# Patient Record
Sex: Female | Born: 1935 | Race: White | Hispanic: No | Marital: Married | Smoking: Never smoker
Health system: Southern US, Community
[De-identification: ages and names within clinical notes are randomized; demographics above are authoritative.]

## PROBLEM LIST (undated history)

## (undated) DIAGNOSIS — I8390 Asymptomatic varicose veins of unspecified lower extremity: Secondary | ICD-10-CM

## (undated) DIAGNOSIS — S42293A Other displaced fracture of upper end of unspecified humerus, initial encounter for closed fracture: Secondary | ICD-10-CM

## (undated) DIAGNOSIS — K219 Gastro-esophageal reflux disease without esophagitis: Secondary | ICD-10-CM

## (undated) DIAGNOSIS — I10 Essential (primary) hypertension: Secondary | ICD-10-CM

## (undated) DIAGNOSIS — D126 Benign neoplasm of colon, unspecified: Secondary | ICD-10-CM

## (undated) DIAGNOSIS — E7800 Pure hypercholesterolemia, unspecified: Secondary | ICD-10-CM

## (undated) DIAGNOSIS — K7460 Unspecified cirrhosis of liver: Secondary | ICD-10-CM

## (undated) DIAGNOSIS — E119 Type 2 diabetes mellitus without complications: Secondary | ICD-10-CM

## (undated) DIAGNOSIS — S62109A Fracture of unspecified carpal bone, unspecified wrist, initial encounter for closed fracture: Secondary | ICD-10-CM

## (undated) DIAGNOSIS — I2510 Atherosclerotic heart disease of native coronary artery without angina pectoris: Secondary | ICD-10-CM

## (undated) DIAGNOSIS — M8580 Other specified disorders of bone density and structure, unspecified site: Secondary | ICD-10-CM

## (undated) DIAGNOSIS — R7611 Nonspecific reaction to tuberculin skin test without active tuberculosis: Secondary | ICD-10-CM

## (undated) HISTORY — DX: Asymptomatic varicose veins of unspecified lower extremity: I83.90

## (undated) HISTORY — DX: Benign neoplasm of colon, unspecified: D12.6

## (undated) HISTORY — DX: Other specified disorders of bone density and structure, unspecified site: M85.80

## (undated) HISTORY — DX: Atherosclerotic heart disease of native coronary artery without angina pectoris: I25.10

## (undated) HISTORY — DX: Fracture of unspecified carpal bone, unspecified wrist, initial encounter for closed fracture: S62.109A

## (undated) HISTORY — DX: Unspecified cirrhosis of liver: K74.60

## (undated) HISTORY — DX: Other displaced fracture of upper end of unspecified humerus, initial encounter for closed fracture: S42.293A

## (undated) HISTORY — DX: Nonspecific reaction to tuberculin skin test without active tuberculosis: R76.11

## (undated) HISTORY — DX: Type 2 diabetes mellitus without complications: E11.9

---

## 1997-10-01 ENCOUNTER — Ambulatory Visit (HOSPITAL_COMMUNITY): Admission: RE | Admit: 1997-10-01 | Discharge: 1997-10-01 | Payer: Self-pay | Admitting: Internal Medicine

## 1998-05-07 ENCOUNTER — Ambulatory Visit (HOSPITAL_COMMUNITY): Admission: RE | Admit: 1998-05-07 | Discharge: 1998-05-07 | Payer: Self-pay | Admitting: Internal Medicine

## 1998-06-18 ENCOUNTER — Ambulatory Visit (HOSPITAL_COMMUNITY): Admission: RE | Admit: 1998-06-18 | Discharge: 1998-06-18 | Payer: Self-pay | Admitting: Obstetrics & Gynecology

## 1999-05-13 ENCOUNTER — Ambulatory Visit (HOSPITAL_COMMUNITY): Admission: RE | Admit: 1999-05-13 | Discharge: 1999-05-13 | Payer: Self-pay | Admitting: Internal Medicine

## 1999-05-13 ENCOUNTER — Encounter: Payer: Self-pay | Admitting: Internal Medicine

## 1999-11-23 ENCOUNTER — Encounter: Admission: RE | Admit: 1999-11-23 | Discharge: 2000-01-05 | Payer: Self-pay | Admitting: Orthopedic Surgery

## 2000-05-17 ENCOUNTER — Encounter: Payer: Self-pay | Admitting: Internal Medicine

## 2000-05-17 ENCOUNTER — Ambulatory Visit (HOSPITAL_COMMUNITY): Admission: RE | Admit: 2000-05-17 | Discharge: 2000-05-17 | Payer: Self-pay | Admitting: Internal Medicine

## 2001-05-18 ENCOUNTER — Encounter: Payer: Self-pay | Admitting: Internal Medicine

## 2001-05-18 ENCOUNTER — Ambulatory Visit (HOSPITAL_COMMUNITY): Admission: RE | Admit: 2001-05-18 | Discharge: 2001-05-18 | Payer: Self-pay | Admitting: Internal Medicine

## 2002-05-22 ENCOUNTER — Ambulatory Visit (HOSPITAL_COMMUNITY): Admission: RE | Admit: 2002-05-22 | Discharge: 2002-05-22 | Payer: Self-pay | Admitting: Internal Medicine

## 2002-05-22 ENCOUNTER — Encounter: Payer: Self-pay | Admitting: Internal Medicine

## 2003-01-01 ENCOUNTER — Encounter: Payer: Self-pay | Admitting: Emergency Medicine

## 2003-01-01 ENCOUNTER — Emergency Department (HOSPITAL_COMMUNITY): Admission: EM | Admit: 2003-01-01 | Discharge: 2003-01-01 | Payer: Self-pay | Admitting: Emergency Medicine

## 2003-01-04 ENCOUNTER — Ambulatory Visit (HOSPITAL_COMMUNITY): Admission: RE | Admit: 2003-01-04 | Discharge: 2003-01-05 | Payer: Self-pay | Admitting: Orthopedic Surgery

## 2003-01-04 ENCOUNTER — Encounter: Payer: Self-pay | Admitting: Orthopedic Surgery

## 2003-02-04 ENCOUNTER — Encounter: Admission: RE | Admit: 2003-02-04 | Discharge: 2003-04-29 | Payer: Self-pay | Admitting: Orthopedic Surgery

## 2003-05-17 ENCOUNTER — Encounter: Payer: Self-pay | Admitting: Internal Medicine

## 2003-05-17 ENCOUNTER — Ambulatory Visit (HOSPITAL_COMMUNITY): Admission: RE | Admit: 2003-05-17 | Discharge: 2003-05-17 | Payer: Self-pay | Admitting: Internal Medicine

## 2004-06-19 ENCOUNTER — Ambulatory Visit (HOSPITAL_COMMUNITY): Admission: RE | Admit: 2004-06-19 | Discharge: 2004-06-19 | Payer: Self-pay | Admitting: Internal Medicine

## 2005-06-23 ENCOUNTER — Ambulatory Visit (HOSPITAL_COMMUNITY): Admission: RE | Admit: 2005-06-23 | Discharge: 2005-06-23 | Payer: Self-pay | Admitting: Internal Medicine

## 2005-08-16 DIAGNOSIS — D126 Benign neoplasm of colon, unspecified: Secondary | ICD-10-CM

## 2005-08-16 HISTORY — DX: Benign neoplasm of colon, unspecified: D12.6

## 2006-07-12 ENCOUNTER — Ambulatory Visit (HOSPITAL_COMMUNITY): Admission: RE | Admit: 2006-07-12 | Discharge: 2006-07-12 | Payer: Self-pay | Admitting: Internal Medicine

## 2007-07-20 ENCOUNTER — Ambulatory Visit (HOSPITAL_COMMUNITY): Admission: RE | Admit: 2007-07-20 | Discharge: 2007-07-20 | Payer: Self-pay | Admitting: Internal Medicine

## 2008-07-23 ENCOUNTER — Ambulatory Visit (HOSPITAL_COMMUNITY): Admission: RE | Admit: 2008-07-23 | Discharge: 2008-07-23 | Payer: Self-pay | Admitting: Internal Medicine

## 2009-10-15 ENCOUNTER — Ambulatory Visit (HOSPITAL_COMMUNITY): Admission: RE | Admit: 2009-10-15 | Discharge: 2009-10-15 | Payer: Self-pay | Admitting: Internal Medicine

## 2010-06-15 ENCOUNTER — Inpatient Hospital Stay (HOSPITAL_BASED_OUTPATIENT_CLINIC_OR_DEPARTMENT_OTHER): Admission: RE | Admit: 2010-06-15 | Discharge: 2010-06-15 | Payer: Self-pay | Admitting: Interventional Cardiology

## 2010-09-06 ENCOUNTER — Encounter: Payer: Self-pay | Admitting: Internal Medicine

## 2010-11-30 ENCOUNTER — Other Ambulatory Visit (HOSPITAL_COMMUNITY): Payer: Self-pay | Admitting: Internal Medicine

## 2010-11-30 DIAGNOSIS — Z1231 Encounter for screening mammogram for malignant neoplasm of breast: Secondary | ICD-10-CM

## 2010-12-02 ENCOUNTER — Ambulatory Visit (HOSPITAL_COMMUNITY)
Admission: RE | Admit: 2010-12-02 | Discharge: 2010-12-02 | Disposition: A | Discharge status: Home / Self Care | Payer: Medicare Other | Source: Ambulatory Visit | Attending: Internal Medicine | Admitting: Internal Medicine

## 2010-12-02 DIAGNOSIS — Z1231 Encounter for screening mammogram for malignant neoplasm of breast: Secondary | ICD-10-CM | POA: Insufficient documentation

## 2011-01-01 NOTE — Op Note (Signed)
NAME:  Sheila Hanson, Sheila Hanson                         ACCOUNT NO.:  [id number]   MEDICAL RECORD NO.:  [id number]                   PATIENT TYPE:  REC   LOCATION:  [facility]                                 FACILITY:  [facility]   PHYSICIAN:  Burnard Bunting, M.D.                 DATE OF BIRTH:  November 26, 1933   DATE OF PROCEDURE:  02/04/2003  DATE OF DISCHARGE:                                 OPERATIVE REPORT   PREOPERATIVE DIAGNOSIS:  Right ankle trimalleolar ankle fracture.   POSTOPERATIVE DIAGNOSIS:  Right ankle trimalleolar ankle fracture.   OPERATION PERFORMED:  Right ankle open reduction internal fixation of  lateral and medial malleolus.   SURGEON:  Burnard Bunting, M.D.   ANESTHESIA:  General endotracheal.   ESTIMATED BLOOD LOSS:  10mL.   DRAINS:  None.   Ankle Esmarch utilized for 79 minutes.   DESCRIPTION OF PROCEDURE:  The patient was brought to the operating room  where general endotracheal anesthesia was induced.  Preoperative antibiotics  were administered.  Right ankle and leg was prepped with DuraPrep solution,  and draped in sterile manner.  Ankle Esmarch was utilized.  This was  utilized to the mid-calf level for approximately 79 minutes.  A lateral  incision was made beginning at the tip of the fibula extending proximally.  The superficial peroneal nerve was identified and mobilized inferiorly.  The  fracture site was visualized and reduced.  The patient's bone quality was  soft.  Because of the fracture configuration, lag screw placement was not  possible.  A 7-hole one third tubular locking plate was then fashioned and  applied to the fibular surface.  Good fixation was achieved.  Following near  anatomic fixation of the lateral malleolar fracture, the medial malleolus  was noted to be well reduced under fluoroscopy.  The fluoroscope was used in  the AP and lateral planes to assist with reduction and screw placement.  The  towel clip was used to pull the fibula laterally and  syndesmosis was stable.  At this time a small medial incision was made and the medial malleolar  fracture was reduced under direct visualization.  Two 4-0 cancellous screws  were then placed across the anterior and posterior colliculi of the medial  malleolus.  Correct placement of the screws was checked in the AP and  lateral plane.  Reduction of the posterior fragment as well as the medial  fragment was anatomic.  At this time both incisions were irrigated.  Tourniquet was released.  Bleeding points were controlled using bipolar  electrocautery.  The skin on the lateral side was closed by achieving soft  tissue closure first over the bone and plate by reapproximating the  periosteal sleeve.  The skin was closed using interrupted inverted 3-0  Vicryl followed by nylon sutures.  The medial incision was closed using  interrupted  inverted 3-0 Vicryl and nylon sutures.  The patient  was placed in a bulky  posterior splint with the heel well padded.  Again fluoroscopic views did  demonstrate good reduction of the ankle mortise, good stability of the  syndesmosis.  The patient was transferred to the recovery room in stable  condition.                                                Burnard Bunting, M.D.    GSD/MEDQ  D:  02/04/2003  T:  02/05/2003  Job:  [id number]

## 2011-01-01 NOTE — Consult Note (Signed)
NAME:  Sheila Hanson, Sheila Hanson                         ACCOUNT NO.:  [id number]   MEDICAL RECORD NO.:  [id number]                   PATIENT TYPE:  EMS   LOCATION:  [facility]                                 FACILITY:  [facility]   PHYSICIAN:  Burnard Bunting, M.D.                 DATE OF BIRTH:  1934-04-10   DATE OF CONSULTATION:  01/01/2003  DATE OF DISCHARGE:                                   CONSULTATION   REFERRING PHYSICIAN:  Emergency room.   CHIEF COMPLAINT:  Right and left ankle pain.   HISTORY OF PRESENT ILLNESS:  The patient is an active 77 year old female who  was picking strawberries in the rain today when she fell and injured her  right and left ankle.  She denies any orthopedic complaints.  She denies any  back pain.   CURRENT MEDICATIONS:  Premarin.   ALLERGIES:  PENICILLIN.  BETADINE.  IVP DYE.  VANCOMYCIN.   PAST SURGICAL HISTORY:  Left wrist fracture surgery performed in the past  with good result.   PHYSICAL EXAMINATION:  MUSCULOSKELETAL:  Nonpainful range of motion in  either hip.  Knee range of motion was full with no effusion.  Upper  extremities with good range of motion with no ecchymosis, effusion or  crepitus in the elbows, wrists or shoulders.  Right ankle demonstrates  tenderness to palpation on the medial and lateral side.  Left ankle also has  tenderness to palpation laterally.  There is some ecchymosis around both  ankles on the lateral aspect and on the medial aspect of the right ankle.  There is no proximal fibular tenderness on either side.  There is palpable  pedal pulse bilaterally.  She has some mild paresthesias on the dorsal  aspect of the right foot, negative on the left foot.  There are no  paresthesias on the bottom.  Toe dorsiflexion and plantar flexion is intact.   LABORATORY DATA AND X-RAY FINDINGS:  X-rays of the right foot show minimally  displaced trimalleolar ankle fracture.  X-rays of the left foot are normal.   IMPRESSION:  1. Right  trimalleolar ankle fracture with minimal displacement.  2. Left ankle sprain.   RECOMMENDATIONS:  I think the patient has an unstable ankle fracture.  Her  posterior malleolar fragment is displaced about 2-3 mm and is about 20% of  the articular surface.  The patient needs to have open reduction internal  fixation.  This will involve plates and screws.  She actually took about  three aspirin last night.  She also ate at 6 p.m. tonight.  The patient was  placed into a well-padded posterior splint on the right hand side and an ASO  on the left hand side.  We are going to get her a walker as well.  She needs  to keep the leg elevated, nonweightbearing on the right hand side.  She  needs to have surgery for  the ankle within the next several days.  She is  instructed to keep her leg elevated.  We also wrote a prescription for  Percocet for pain.                                              Burnard Bunting, M.D.   GSD/MEDQ  D:  01/01/2003  T:  01/02/2003  Job:  [id number]

## 2011-05-15 ENCOUNTER — Emergency Department (INDEPENDENT_AMBULATORY_CARE_PROVIDER_SITE_OTHER): Payer: Medicare Other

## 2011-05-15 ENCOUNTER — Encounter: Payer: Self-pay | Admitting: *Deleted

## 2011-05-15 ENCOUNTER — Inpatient Hospital Stay (HOSPITAL_COMMUNITY)
Admission: EM | Admit: 2011-05-15 | Discharge: 2011-05-22 | DRG: 087 | Disposition: A | Discharge status: Home / Self Care | Payer: Medicare Other | Source: Other Acute Inpatient Hospital | Attending: Neurosurgery | Admitting: Neurosurgery

## 2011-05-15 ENCOUNTER — Emergency Department (HOSPITAL_BASED_OUTPATIENT_CLINIC_OR_DEPARTMENT_OTHER)
Admission: EM | Admit: 2011-05-15 | Discharge: 2011-05-15 | Disposition: A | Discharge status: Other Acute Inpatient Hospital | Payer: Medicare Other | Source: Home / Self Care | Attending: Emergency Medicine | Admitting: Emergency Medicine

## 2011-05-15 DIAGNOSIS — S0633AA Contusion and laceration of cerebrum, unspecified, with loss of consciousness status unknown, initial encounter: Secondary | ICD-10-CM | POA: Insufficient documentation

## 2011-05-15 DIAGNOSIS — S062X9A Diffuse traumatic brain injury with loss of consciousness of unspecified duration, initial encounter: Secondary | ICD-10-CM

## 2011-05-15 DIAGNOSIS — S06330A Contusion and laceration of cerebrum, unspecified, without loss of consciousness, initial encounter: Secondary | ICD-10-CM | POA: Insufficient documentation

## 2011-05-15 DIAGNOSIS — W010XXA Fall on same level from slipping, tripping and stumbling without subsequent striking against object, initial encounter: Secondary | ICD-10-CM | POA: Insufficient documentation

## 2011-05-15 DIAGNOSIS — S02109A Fracture of base of skull, unspecified side, initial encounter for closed fracture: Principal | ICD-10-CM | POA: Diagnosis present

## 2011-05-15 DIAGNOSIS — W19XXXA Unspecified fall, initial encounter: Secondary | ICD-10-CM

## 2011-05-15 DIAGNOSIS — Y998 Other external cause status: Secondary | ICD-10-CM

## 2011-05-15 DIAGNOSIS — S0280XA Fracture of other specified skull and facial bones, unspecified side, initial encounter for closed fracture: Secondary | ICD-10-CM | POA: Insufficient documentation

## 2011-05-15 DIAGNOSIS — Z79899 Other long term (current) drug therapy: Secondary | ICD-10-CM | POA: Insufficient documentation

## 2011-05-15 DIAGNOSIS — K219 Gastro-esophageal reflux disease without esophagitis: Secondary | ICD-10-CM | POA: Insufficient documentation

## 2011-05-15 DIAGNOSIS — R197 Diarrhea, unspecified: Secondary | ICD-10-CM | POA: Diagnosis not present

## 2011-05-15 DIAGNOSIS — I609 Nontraumatic subarachnoid hemorrhage, unspecified: Secondary | ICD-10-CM | POA: Insufficient documentation

## 2011-05-15 DIAGNOSIS — R296 Repeated falls: Secondary | ICD-10-CM | POA: Diagnosis present

## 2011-05-15 DIAGNOSIS — Y9289 Other specified places as the place of occurrence of the external cause: Secondary | ICD-10-CM | POA: Insufficient documentation

## 2011-05-15 DIAGNOSIS — E7800 Pure hypercholesterolemia, unspecified: Secondary | ICD-10-CM | POA: Insufficient documentation

## 2011-05-15 DIAGNOSIS — R21 Rash and other nonspecific skin eruption: Secondary | ICD-10-CM | POA: Diagnosis not present

## 2011-05-15 DIAGNOSIS — S065X9A Traumatic subdural hemorrhage with loss of consciousness of unspecified duration, initial encounter: Secondary | ICD-10-CM

## 2011-05-15 DIAGNOSIS — I6200 Nontraumatic subdural hemorrhage, unspecified: Secondary | ICD-10-CM | POA: Insufficient documentation

## 2011-05-15 DIAGNOSIS — I10 Essential (primary) hypertension: Secondary | ICD-10-CM | POA: Insufficient documentation

## 2011-05-15 DIAGNOSIS — S0291XA Unspecified fracture of skull, initial encounter for closed fracture: Secondary | ICD-10-CM

## 2011-05-15 DIAGNOSIS — Z7982 Long term (current) use of aspirin: Secondary | ICD-10-CM

## 2011-05-15 HISTORY — DX: Gastro-esophageal reflux disease without esophagitis: K21.9

## 2011-05-15 HISTORY — DX: Pure hypercholesterolemia, unspecified: E78.00

## 2011-05-15 HISTORY — DX: Essential (primary) hypertension: I10

## 2011-05-15 LAB — BASIC METABOLIC PANEL
BUN: 15 mg/dL (ref 6–23)
Creatinine, Ser: 0.6 mg/dL (ref 0.50–1.10)
GFR calc Af Amer: >60 mL/min (ref >60)
GFR calc non Af Amer: >60 mL/min (ref >60)
Glucose, Bld: 141 mg/dL — ABNORMAL HIGH (ref 70–99)
Potassium: 3.6 mEq/L (ref 3.5–5.1)

## 2011-05-15 LAB — CBC
HCT: 38.9 % (ref 36.0–46.0)
Hemoglobin: 13.3 g/dL (ref 12.0–15.0)
MCH: 29.4 pg (ref 26.0–34.0)
MCHC: 34.2 g/dL (ref 30.0–36.0)
MCV: 86.1 fL (ref 78.0–100.0)
RBC: 4.52 MIL/uL (ref 3.87–5.11)

## 2011-05-15 LAB — DIFFERENTIAL
Basophils Relative: 0 % (ref 0–1)
Eosinophils Absolute: 0.1 10*3/uL (ref 0.0–0.7)
Eosinophils Relative: 0 % (ref 0–5)
Lymphs Abs: 1.6 10*3/uL (ref 0.7–4.0)
Monocytes Absolute: 0.6 10*3/uL (ref 0.1–1.0)
Monocytes Relative: 5 % (ref 3–12)

## 2011-05-15 LAB — APTT: aPTT: 36 seconds (ref 24–37)

## 2011-05-15 IMAGING — CT CT HEAD W/O CM
1 series · 15 of 30 positions shown, 19 images · non-contrast
Comparison: None.

CLINICAL DATA: 77-year-old female status post fall.  Headache and
nausea.

CT HEAD WITHOUT CONTRAST
TECHNIQUE: Contiguous axial images were obtained from the base of
the skull through the vertex without contrast.

[Series 2: head 4.8 h37s · axial · 0.44mm/px · z∈[-103,+34]mm · 15 of 32 slices shown, 19 images]
[im 2/32  brain]
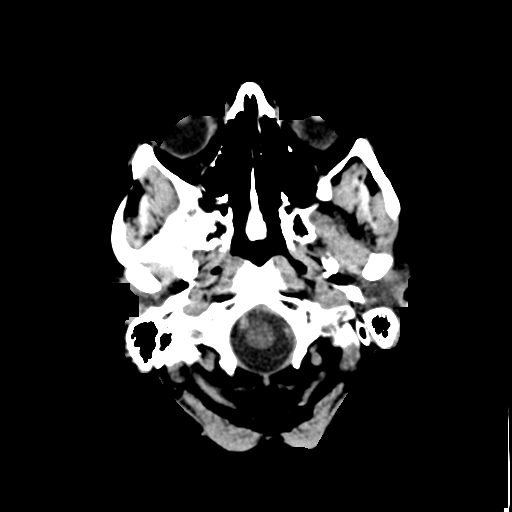
[im 2/32  bone]
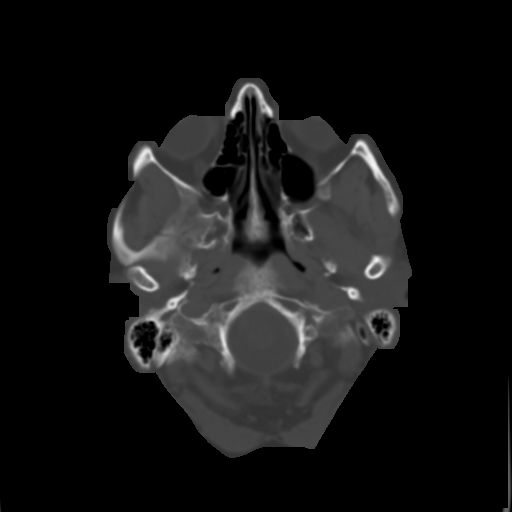
[im 4/32  brain]
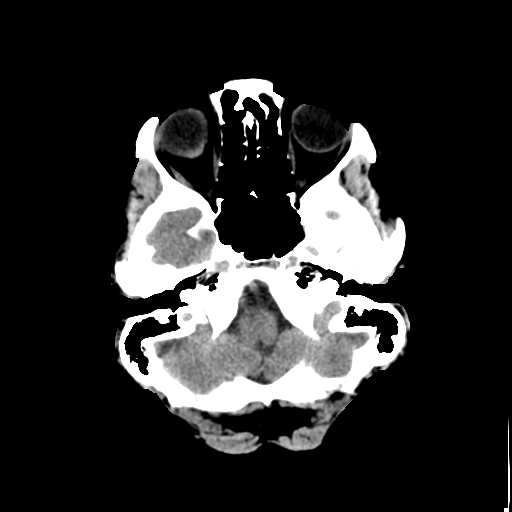
[im 6/32  brain]
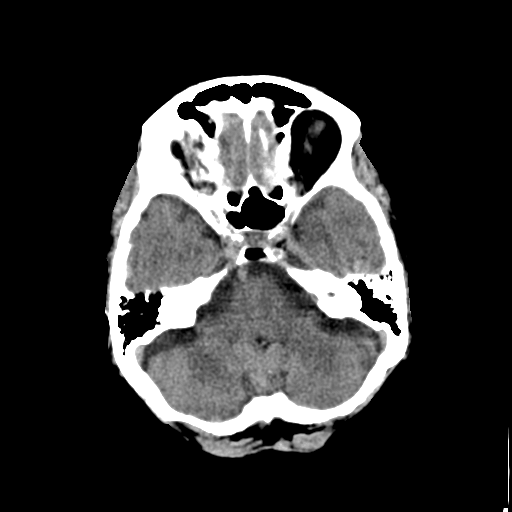
[im 8/32  brain]
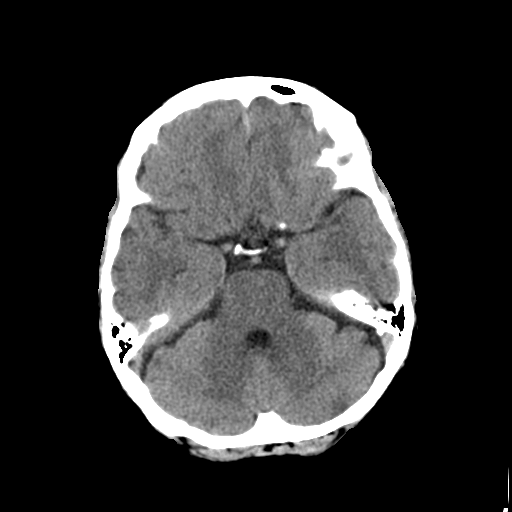
[im 10/32  brain]
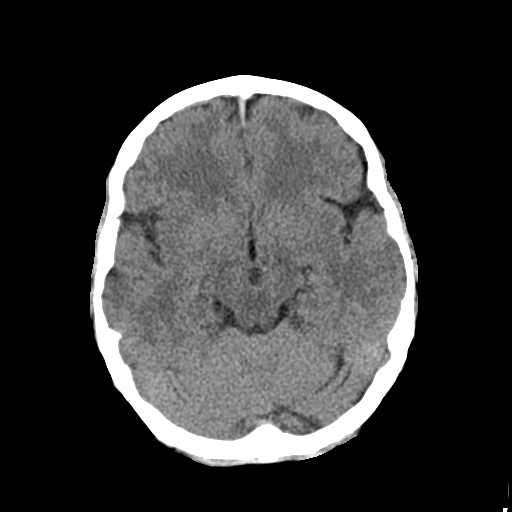
[im 10/32  bone]
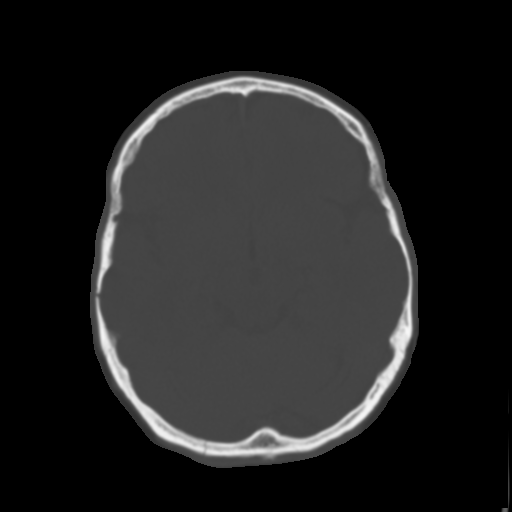
[im 12/32  brain]
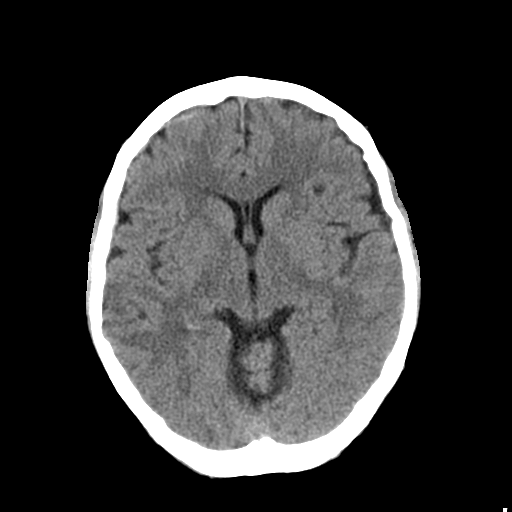
[im 14/32  brain]
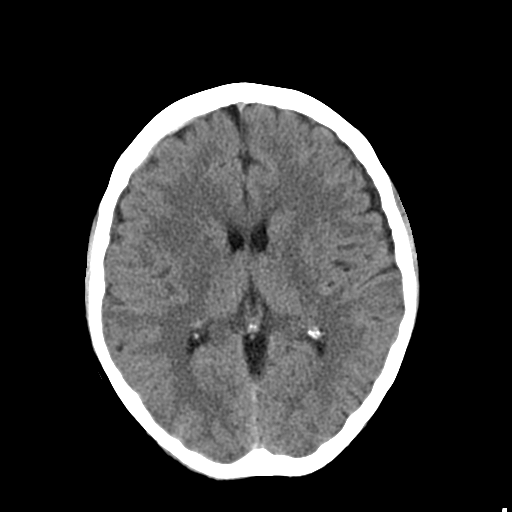
[im 17/32  brain]
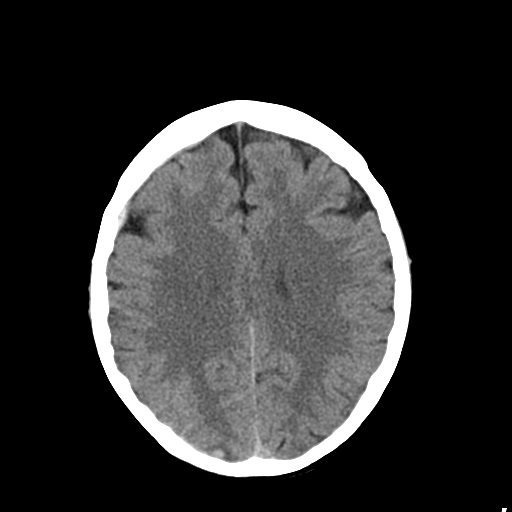
[im 18/32  brain]
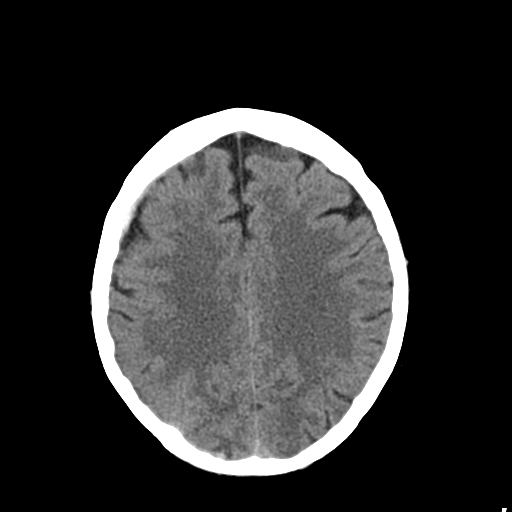
[im 18/32  bone]
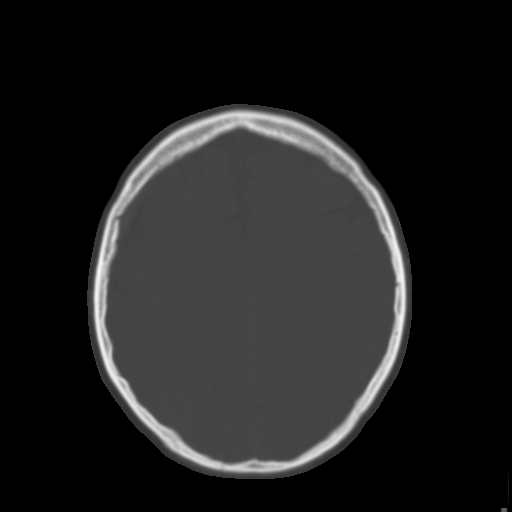
[im 20/32  brain]
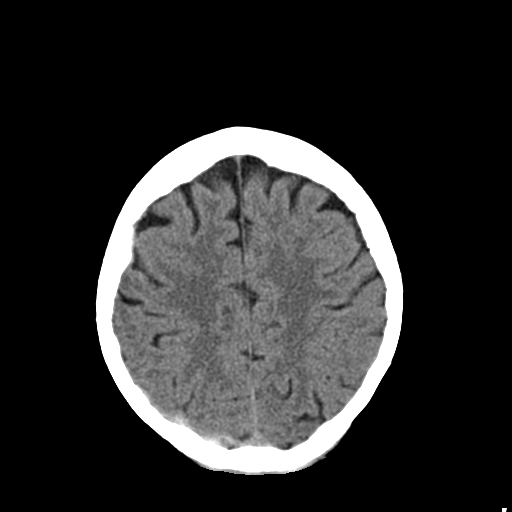
[im 22/32  brain]
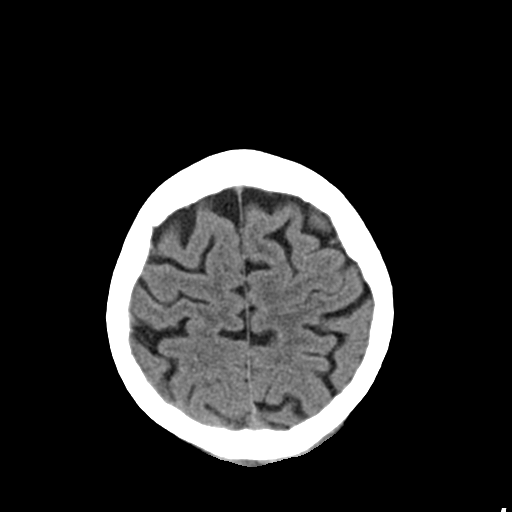
[im 24/32  brain]
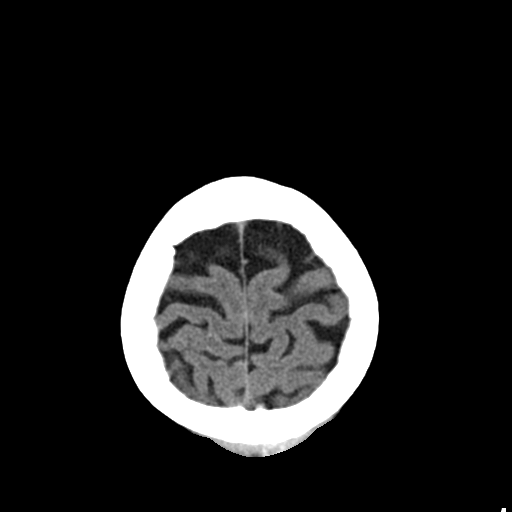
[im 26/32  brain]
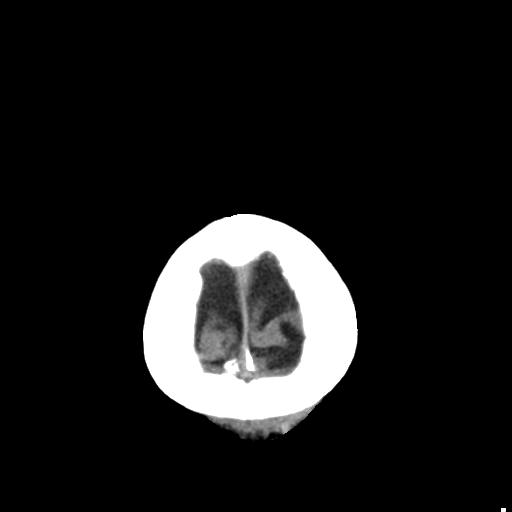
[im 26/32  bone]
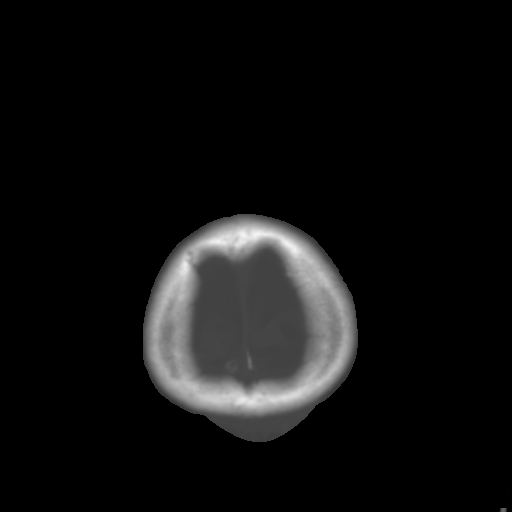
[im 28/32  brain]
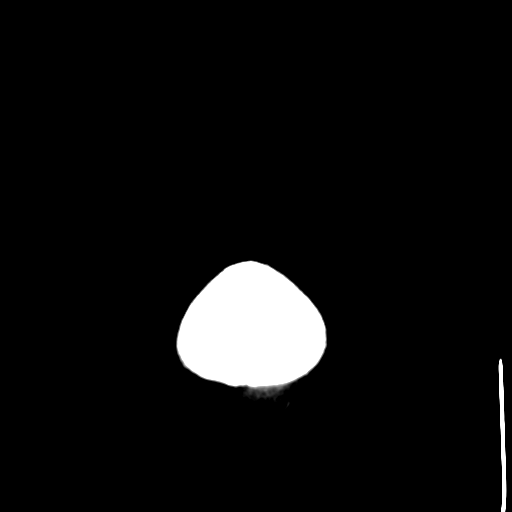
[im 30/32  brain]
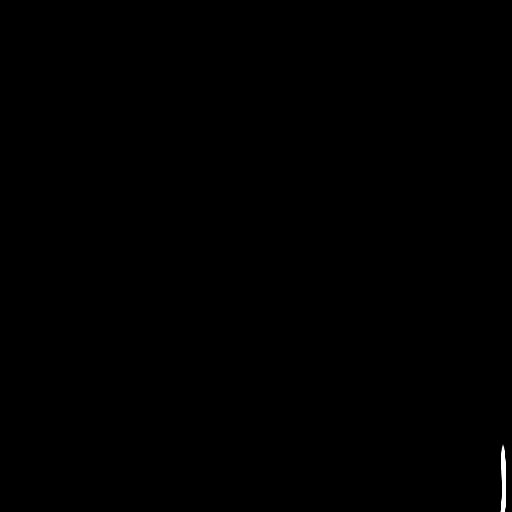

[15 of 30 positions shown; findings below may reference images not displayed]

FINDINGS: Broad-based posterior scalp hematoma measures up to 14 mm
in short axis.  Nondisplaced fracture of the right occipital bone
extends cephalad.  Pain just below the occipital protuberance
(series 3 image 14).

Calvarium elsewhere appears intact. Visualized paranasal sinuses
and mastoids are clear.  Visualized orbit soft tissues are within
normal limits.

Small right subdural hematoma extends along the anterior convexity
measuring up to 4-5 mm in thickness.  No subsequent midline shift
or significant mass effect on the right hemisphere.  No
ventriculomegaly or intraventricular hemorrhage.

There does appear to be a small hemorrhagic contusion of the right
occipital pole underlying the calvarial fracture on series 2 image
17.  There may be trace associated subarachnoid hemorrhage in the
adjacent sulci.  No associated mass effect.

Basilar cisterns are patent. No evidence of cortically based acute
infarction identified.  No suspicious intracranial vascular
hyperdensity.
IMPRESSION: 1.  Small right subdural hematoma, 4-5 mm in thickness.  No
associated mass effect.
2.  Small right occipital lobe hemorrhagic contusion.  Trace
adjacent subarachnoid hemorrhage suspected.
3.  Associated nondisplaced rod are at the bone fracture.
4.  Overlying posterior scalp hematoma.

Critical Value/emergent results were called by telephone at the
time of interpretation on [DATE]  at [XQ] hours  to  Dr.
SIDDHARTH, who verbally acknowledged these results.

## 2011-05-15 MED ORDER — FENTANYL CITRATE 0.05 MG/ML IJ SOLN
50.0000 ug | Freq: Once | INTRAMUSCULAR | Status: AC
  Administered 2011-05-15: 50 ug via INTRAVENOUS
  Filled 2011-05-15: qty 2

## 2011-05-15 MED ORDER — NICARDIPINE HCL IN NACL 20-0.86 MG/200ML-% IV SOLN
5.0000 mg/h | Freq: Once | INTRAVENOUS | Status: AC
  Administered 2011-05-15: 5 mg/h via INTRAVENOUS
  Filled 2011-05-15: qty 200

## 2011-05-15 MED ORDER — ONDANSETRON HCL 4 MG/2ML IJ SOLN
4.0000 mg | Freq: Once | INTRAMUSCULAR | Status: AC
  Administered 2011-05-15: 4 mg via INTRAVENOUS
  Filled 2011-05-15: qty 2

## 2011-05-15 NOTE — ED Provider Notes (Signed)
History   Chart scribed for Nat Christen, MD by Enos Fling; the patient was seen in room MH11/MH11; this patient's care was started at 9:35 PM.   CSN: [id number] Arrival date & time: 05/15/2011  8:36 PM  Chief Complaint  Patient presents with  . Fall    HPI Sheila Hanson is a 77 y.o. female who presents to the Emergency Department s/p fall. Per husband, pt tripped and fell backward on cement, hitting the back of her head approx 5.5 hours ago. Pt c/o headache, nausea, and vomiting persistent since the fall, as well as "muscular" hip and neck soreness. Pt has been ambulatory without difficulty. She reports brief blurred vision and hearing change that has resolved. No sob, cp, back pain, or abd pain. Pt denies numbness, tingling, weakness, dizziness, change in speech, or confusion. Pt takes daily baby ASA; no other blood thinners.   Past Medical History  Diagnosis Date  . Hypertension   . Hypercholesteremia   . GERD (gastroesophageal reflux disease)     History reviewed. No pertinent past surgical history.  History reviewed. No pertinent family history.  History  Substance Use Topics  . Smoking status: Never Smoker   . Smokeless tobacco: Not on file  . Alcohol Use: No    OB History    Grav Para Term Preterm Abortions TAB SAB Ect Mult Living                  Review of Systems  Constitutional: Negative for fever and chills.  HENT: Negative for congestion and rhinorrhea.   Eyes: Negative for redness.  Respiratory: Negative for cough and shortness of breath.   Cardiovascular: Negative for chest pain and leg swelling.  Gastrointestinal: Positive for nausea and vomiting. Negative for abdominal pain and diarrhea.  Genitourinary: Negative for flank pain.  Musculoskeletal: Negative for back pain.       Hip soreness  Skin: Negative for rash.  Neurological: Positive for headaches. Negative for dizziness, syncope, speech difficulty, weakness and numbness.    Psychiatric/Behavioral: Negative for confusion.    Allergies  Penicillins; Azithromycin; Benadryl allergy; Codeine sulfate; Iodine; Oxycodone; Percocet; and Vancomycin  Home Medications   Current Outpatient Rx  Name Route Sig Dispense Refill  . ASPIRIN 81 MG PO TABS Oral Take 81 mg by mouth daily.      Marland Kitchen BIOTIN 1000 MCG PO TABS Oral Take 1,000 mcg by mouth daily.      Marland Kitchen CALCIUM-MAGNESIUM-VITAMIN D 600-40-500 MG-MG-UNIT PO TB24 Oral Take 1 tablet by mouth daily.      Marland Kitchen VITAMIN D 1000 UNITS PO TABS Oral Take 1,000 Units by mouth daily.      Marland Kitchen COLACE PO Oral Take 3 capsules by mouth at bedtime.      . ESTROGENS CONJUGATED 0.625 MG PO TABS Oral Take 0.625 mg by mouth daily.     . OMEGA-3 FATTY ACIDS 1000 MG PO CAPS Oral Take 1 g by mouth daily.      Marland Kitchen HYDROCHLOROTHIAZIDE 25 MG PO TABS Oral Take 25 mg by mouth daily.      Marland Kitchen LORATADINE 10 MG PO TABS Oral Take 10 mg by mouth daily.      Marland Kitchen OMEPRAZOLE 20 MG PO CPDR Oral Take 20 mg by mouth daily.      Marland Kitchen SIMVASTATIN 20 MG PO TABS Oral Take 20 mg by mouth daily.     Marland Kitchen VITAMIN C 500 MG PO TABS Oral Take 500 mg by mouth daily.      Marland Kitchen  MAGNESIUM OXIDE PO Oral Take 1 tablet by mouth at bedtime.        BP 178/74  Pulse 91  Temp(Src) 98.7 F (37.1 C) (Oral)  Resp 18  SpO2 100%  Physical Exam  Constitutional: She is oriented to person, place, and time. She appears well-developed and well-nourished.  Non-toxic appearance. She does not have a sickly appearance.  HENT:  Head: Normocephalic.       Raised hematoma to left posterior scalp with mild abrasion, no laceration  Eyes: Conjunctivae, EOM and lids are normal. Pupils are equal, round, and reactive to light. No scleral icterus.  Neck: Trachea normal and normal range of motion. Neck supple.  Cardiovascular: Regular rhythm and normal heart sounds.   Pulmonary/Chest: Effort normal and breath sounds normal.  Abdominal: Soft. Normal appearance. There is no tenderness. There is no rebound, no  guarding and no CVA tenderness.  Musculoskeletal: Normal range of motion. She exhibits no tenderness.       Entire spine nontender; pelvis stable, hips nontender bilaterally  Neurological: She is alert and oriented to person, place, and time. She has normal strength. She is not disoriented. No cranial nerve deficit.  Skin: Skin is warm, dry and intact. No rash noted.  Psychiatric: She has a normal mood and affect. Her behavior is normal. Judgment and thought content normal.    ED Course  CRITICAL CARE Performed by: Emeline General A Authorized by: Emeline General A Total critical care time: 35 minutes Critical care time was exclusive of separately billable procedures and treating other patients and teaching time. Critical care was necessary to treat or prevent imminent or life-threatening deterioration of the following conditions: CNS failure or compromise and trauma. Critical care was time spent personally by me on the following activities: development of treatment plan with patient or surrogate, discussions with consultants, obtaining history from patient or surrogate, ordering and review of radiographic studies, examination of patient, ordering and review of laboratory studies, re-evaluation of patient's condition, ordering and performing treatments and interventions and pulse oximetry.   - none   Labs Reviewed  CBC  DIFFERENTIAL  BASIC METABOLIC PANEL  PROTIME-INR  APTT   Ct Head Wo Contrast  05/15/2011  *RADIOLOGY REPORT*  Clinical Data: 77 year old female status post fall.  Headache and nausea.  CT HEAD WITHOUT CONTRAST  Technique:  Contiguous axial images were obtained from the base of the skull through the vertex without contrast.  Comparison: None.  Findings: Broad-based posterior scalp hematoma measures up to 14 mm in short axis.  Nondisplaced fracture of the right occipital bone extends cephalad.  Pain just below the occipital protuberance (series 3 image 14).  Calvarium  elsewhere appears intact. Visualized paranasal sinuses and mastoids are clear.  Visualized orbit soft tissues are within normal limits.  Small right subdural hematoma extends along the anterior convexity measuring up to 4-5 mm in thickness.  No subsequent midline shift or significant mass effect on the right hemisphere.  No ventriculomegaly or intraventricular hemorrhage.  There does appear to be a small hemorrhagic contusion of the right occipital pole underlying the calvarial fracture on series 2 image 17.  There may be trace associated subarachnoid hemorrhage in the adjacent sulci.  No associated mass effect.  Basilar cisterns are patent. No evidence of cortically based acute infarction identified.  No suspicious intracranial vascular hyperdensity.  IMPRESSION: 1.  Small right subdural hematoma, 4-5 mm in thickness.  No associated mass effect. 2.  Small right occipital lobe hemorrhagic contusion.  Trace adjacent  subarachnoid hemorrhage suspected. 3.  Associated nondisplaced rod are at the bone fracture. 4.  Overlying posterior scalp hematoma.  Critical Value/emergent results were called by telephone at the time of interpretation on 05/15/2011  at 2150 hours  to  Dr. Emeline General, who verbally acknowledged these results.  Original Report Authenticated By: Harley Hallmark, M.D.    All results reviewed and discussed, questions answered, pt agreeable with plan.  OTHER DATA REVIEWED: Nursing notes and vital signs reviewed. Prior records reviewed.  MEDS GIVEN IN ED: fentaNYL (SUBLIMAZE) 0.05 MG/ML injection 50 mcg   ondansetron (ZOFRAN) injection 4 mg    9:50 PM - Informed of CT head results by radiologist 9:57 PM - Results and plan for transfer to [facility] discussed with family and pt; they understand and agree with plan  MDM  Patient had a fall earlier today and has  sustained subdural hemorrhage as well as a skull fracture and other intracranial injury from this injury.  Patient is maintaining her  mental status with the GCS 15 at this point in time. I did contact Dr. Jeral Fruit over a at [facility] and he has accepted the patient in transfer to the neurointensive care unit for further evaluation and monitoring. Patient is receiving pain medications and nausea medications to assist with her symptoms. She does not appear to have any other injury on exam as she has no spine tenderness in her CT or L-spine. She has no chest wall tenderness. She has a soft nontender abdomen as well. She is able to ambulate after this and so while her hips are sore it is unlikely she has a hip fracture. Patient is on only a baby aspirin for anticoagulation at this point in time so needs no reversal. Patient is mildly hypertensive so I will begin her on a nicardipine drip at this point in time.  Critical care time in this patient is 35 minutes. This was necessary given the significance of the patient's injuries with intracranial hemorrhage. She also is necessitating blood pressure control pain control as well and consultation with neurosurgery specialist.   No diagnosis found.  SCRIBE ATTESTATION: I personally performed the services described in this documentation, which was scribed in my presence. The recorded information has been reviewed and considered.       Nat Christen, MD 05/15/11 2231

## 2011-05-15 NOTE — ED Notes (Signed)
Pt state that she tripped and  fell this Pm around 1630 pt hit the back of her head presents with HA and nausea pt

## 2011-05-15 NOTE — ED Notes (Signed)
Report to [place] at [facility] [id number]

## 2011-05-16 ENCOUNTER — Inpatient Hospital Stay (HOSPITAL_COMMUNITY): Payer: Medicare Other

## 2011-05-16 LAB — GLUCOSE, CAPILLARY: Glucose-Capillary: 138 mg/dL — ABNORMAL HIGH (ref 70–99)

## 2011-05-16 IMAGING — CT CT HEAD W/O CM
1 of 2 series · 13 of 30 positions shown, 17 images · non-contrast
Comparison: [DATE].

CLINICAL DATA: 77-year-old female status post fall with skull
fracture, hemorrhage.

CT HEAD WITHOUT CONTRAST
TECHNIQUE: Contiguous axial images were obtained from the base of
the skull through the vertex without contrast.

[Series 2: brain · axial · 0.45mm/px · z∈[+99,+226]mm · 13 of 28 slices shown, 17 images]
[im 2/28  brain]
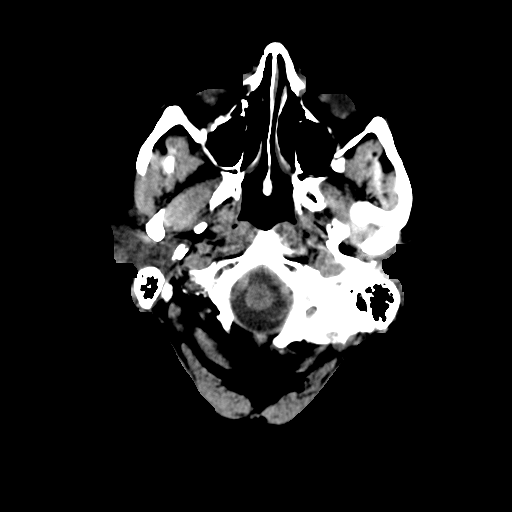
[im 2/28  bone]
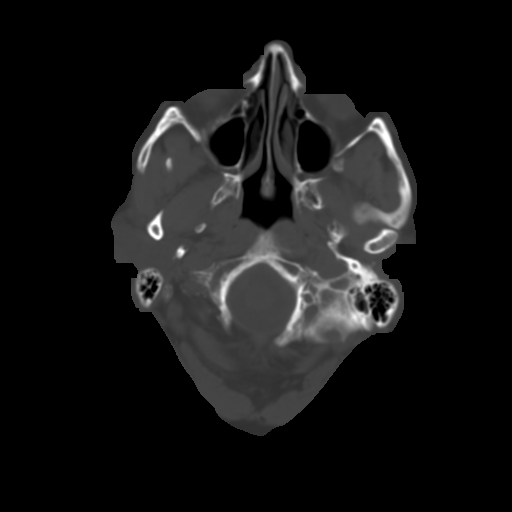
[im 4/28  brain]
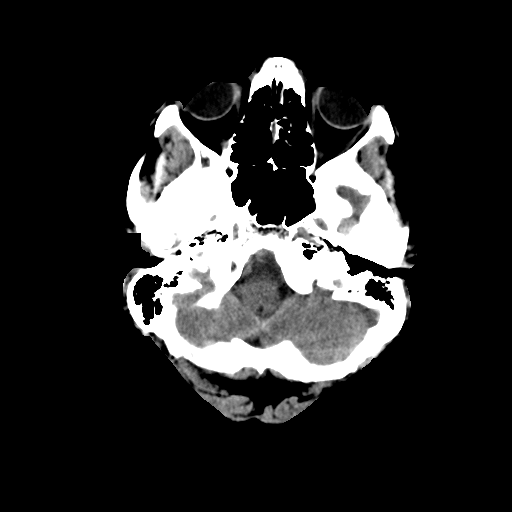
[im 6/28  brain]
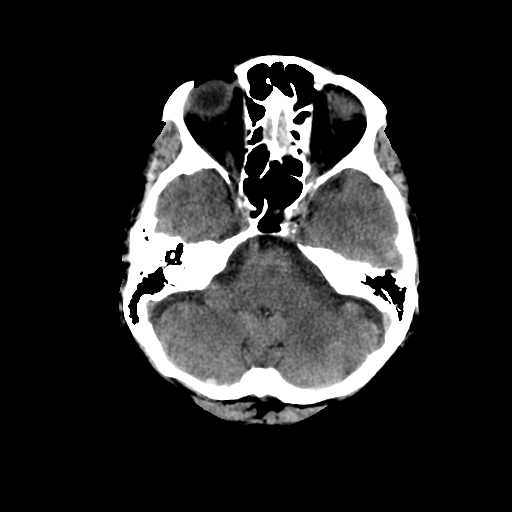
[im 8/28  brain]
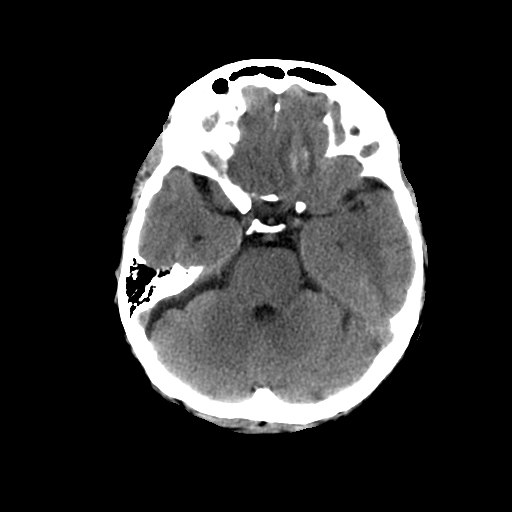
[im 10/28  brain]
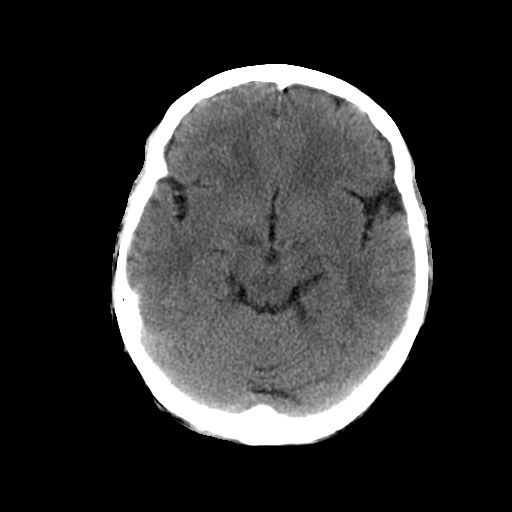
[im 10/28  bone]
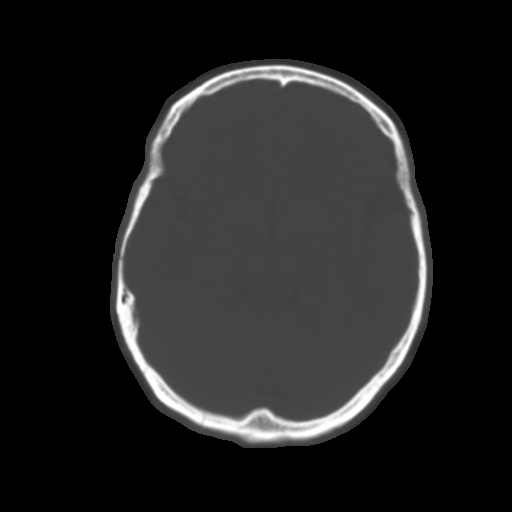
[im 12/28  brain]
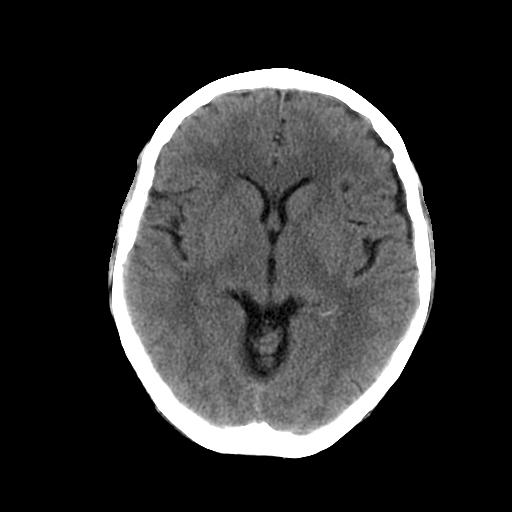
[im 14/28  brain]
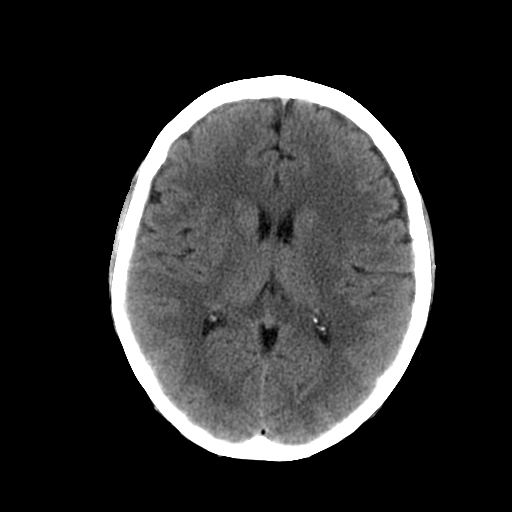
[im 16/28  brain]
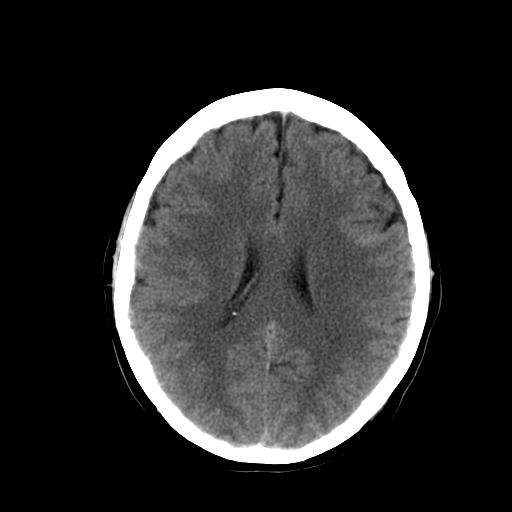
[im 18/28  brain]
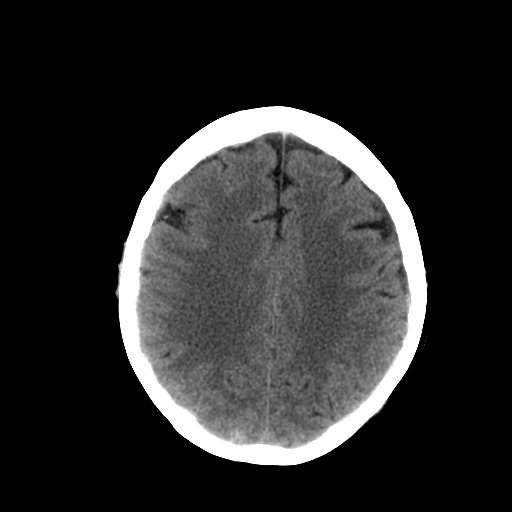
[im 18/28  bone]
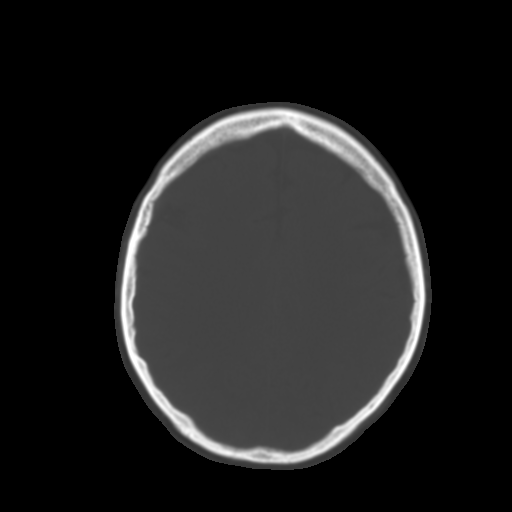
[im 20/28  brain]
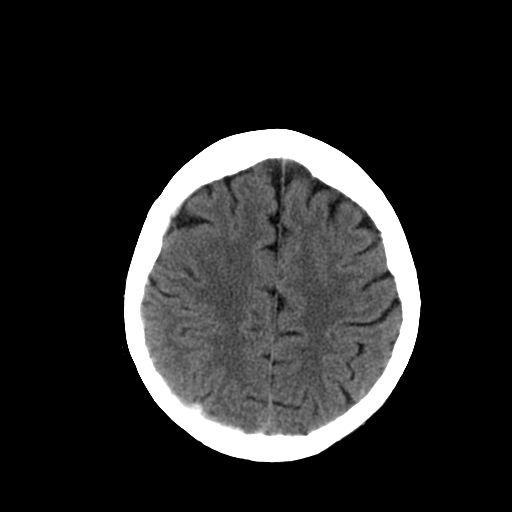
[im 22/28  brain]
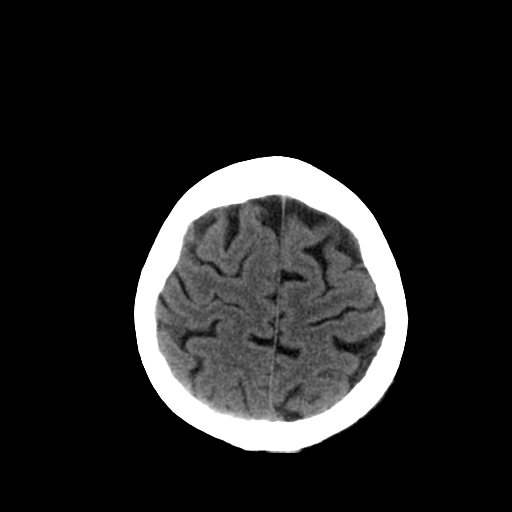
[im 24/28  brain]
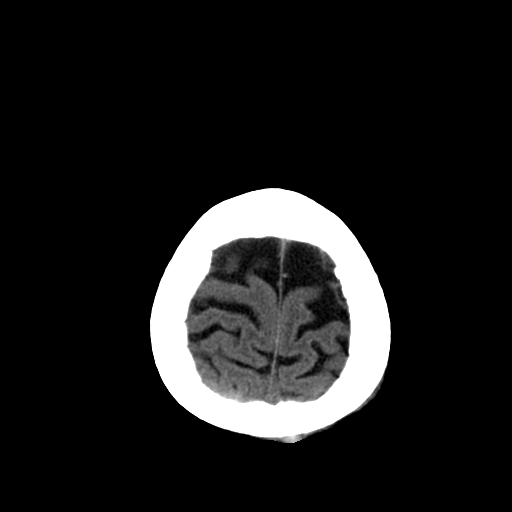
[im 26/28  brain]
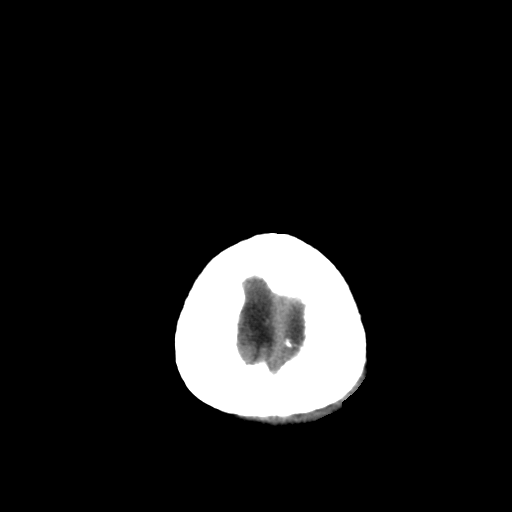
[im 26/28  bone]
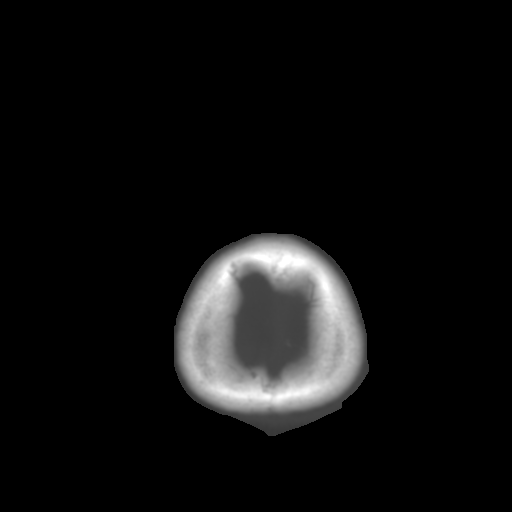

[13 of 30 positions shown; findings below may reference images not displayed]

FINDINGS: Left inferior frontal hemorrhagic contusion now more
evident (series 2 image 7).  Small right subdural hematoma has
regressed along the right lateral convexity, now up to 3 mm in
thickness however, posterior convexity extra-axial blood is mildly
increased in the vicinity of the small right occipital pole
contusion and occipital bone fracture, now up to 6 mm in thickness.
No subarachnoid or intraventricular hemorrhage.

No ventriculomegaly.  No midline shift.  No significant mass
effect.  Normal basilar cisterns.  Stable, normal gray-white matter
differentiation outside of the left frontal and right occipital
lobes. No evidence of cortically based acute infarction identified.
No suspicious intracranial vascular hyperdensity.

Unchanged nondisplaced right occipital bone fracture.  Mildly
decreased vertex scalp hematoma.  No new osseous abnormality.  Mild
left ethmoid sinus bubbly opacity.  Other Visualized paranasal
sinuses and mastoids are clear.  Visualized orbit soft tissues are
within normal limits.
IMPRESSION: 1.  Left inferior frontal lobe hemorrhagic contusion more apparent.
Mild associated edema without mass effect.
2.  Decreased right subdural along the right lateral convexity, but
increased focal subdural along the posterior convexity near the
small occipital lobe hemorrhagic contusion.  No significant mass
effect.
3.  Stable nondisplaced right occipital bone fracture.

## 2011-05-16 IMAGING — CR DG CERVICAL SPINE 1V
2 series · 2 of 2 positions shown · non-contrast
Comparison: Head CTs from the same day and earlier.

CLINICAL DATA: 77-year-old female status post fall.  Right
occipital skull fracture.  Posterior neck pain.

DG SPINE PORTABLE - 1 VIEW

[w c-spine lat]
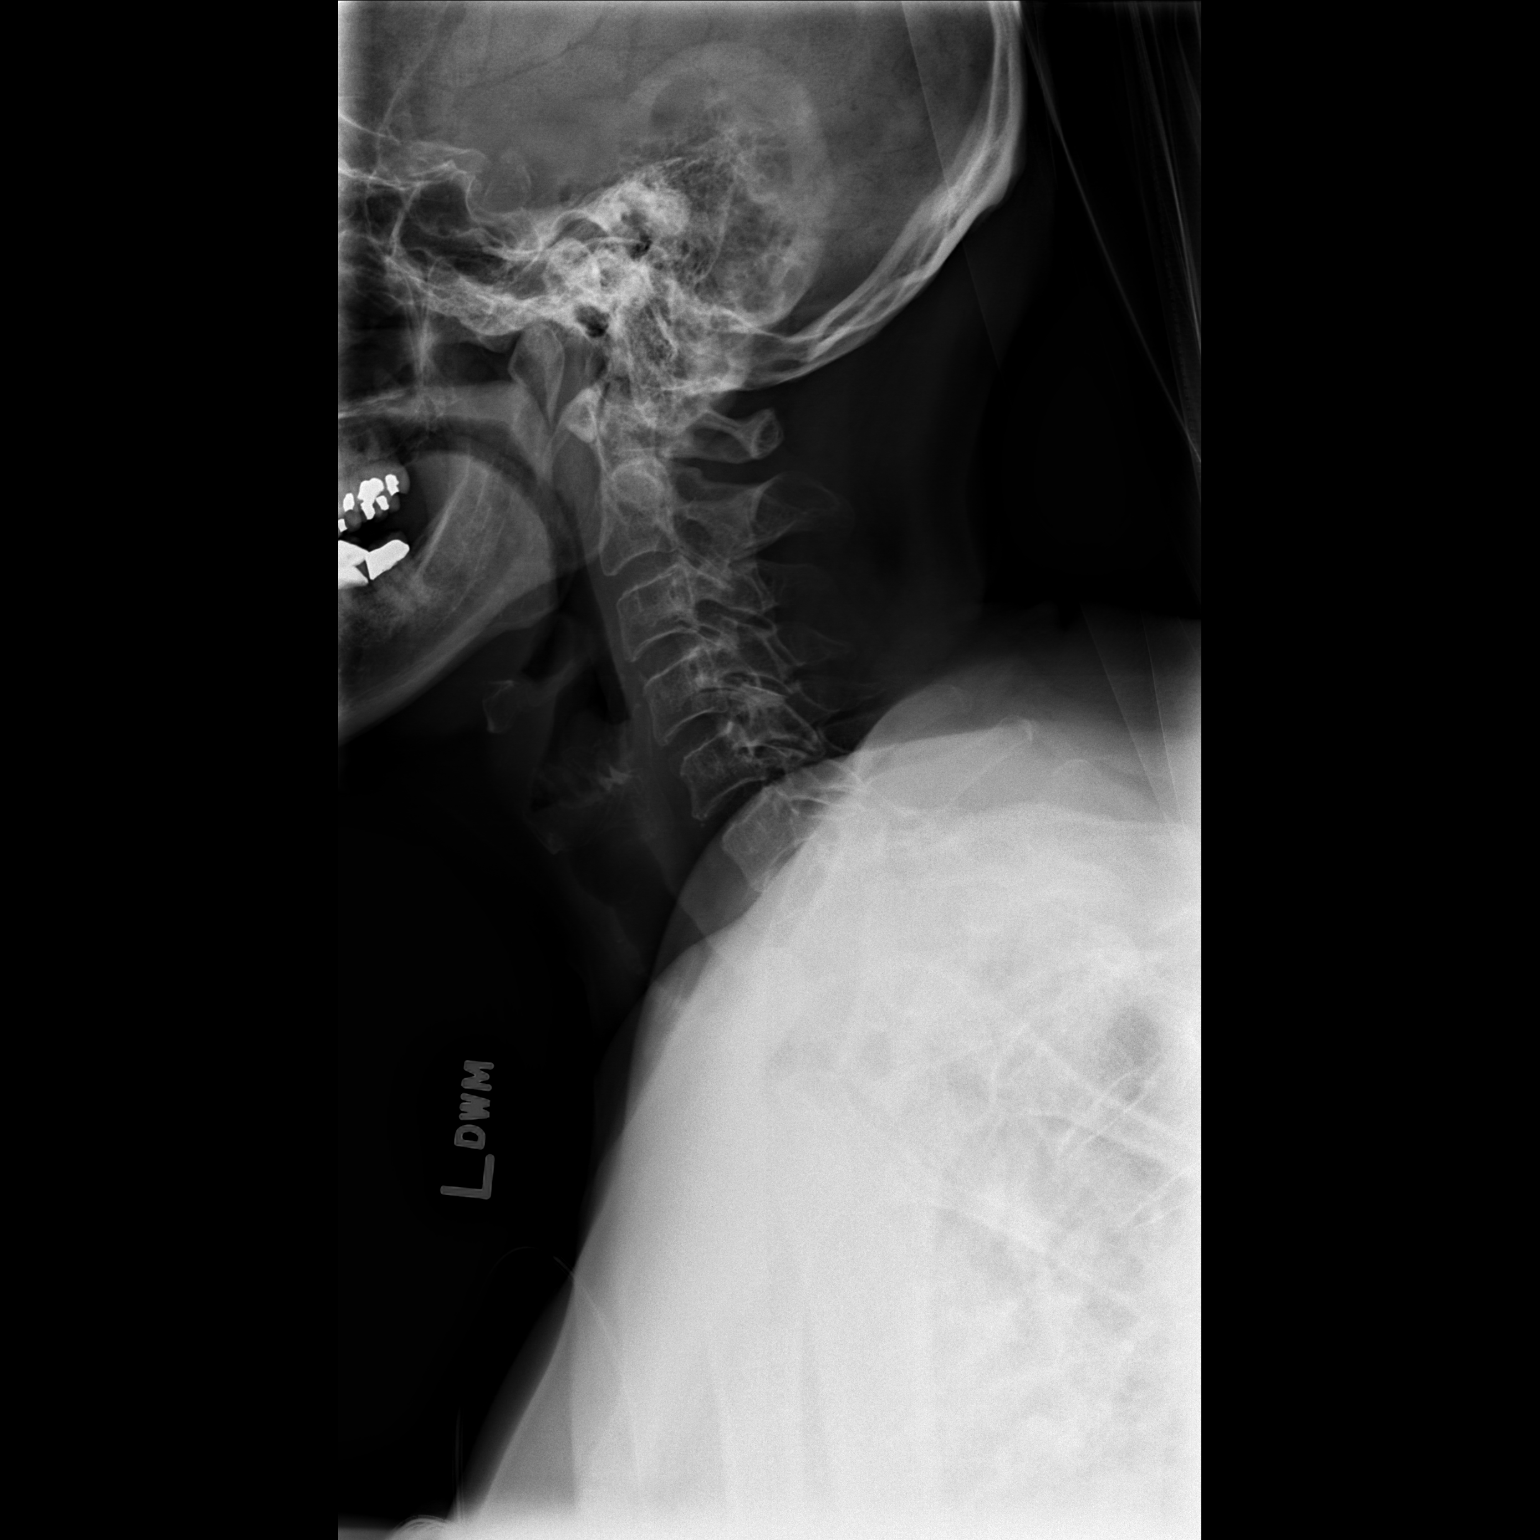

[w swimmers view]
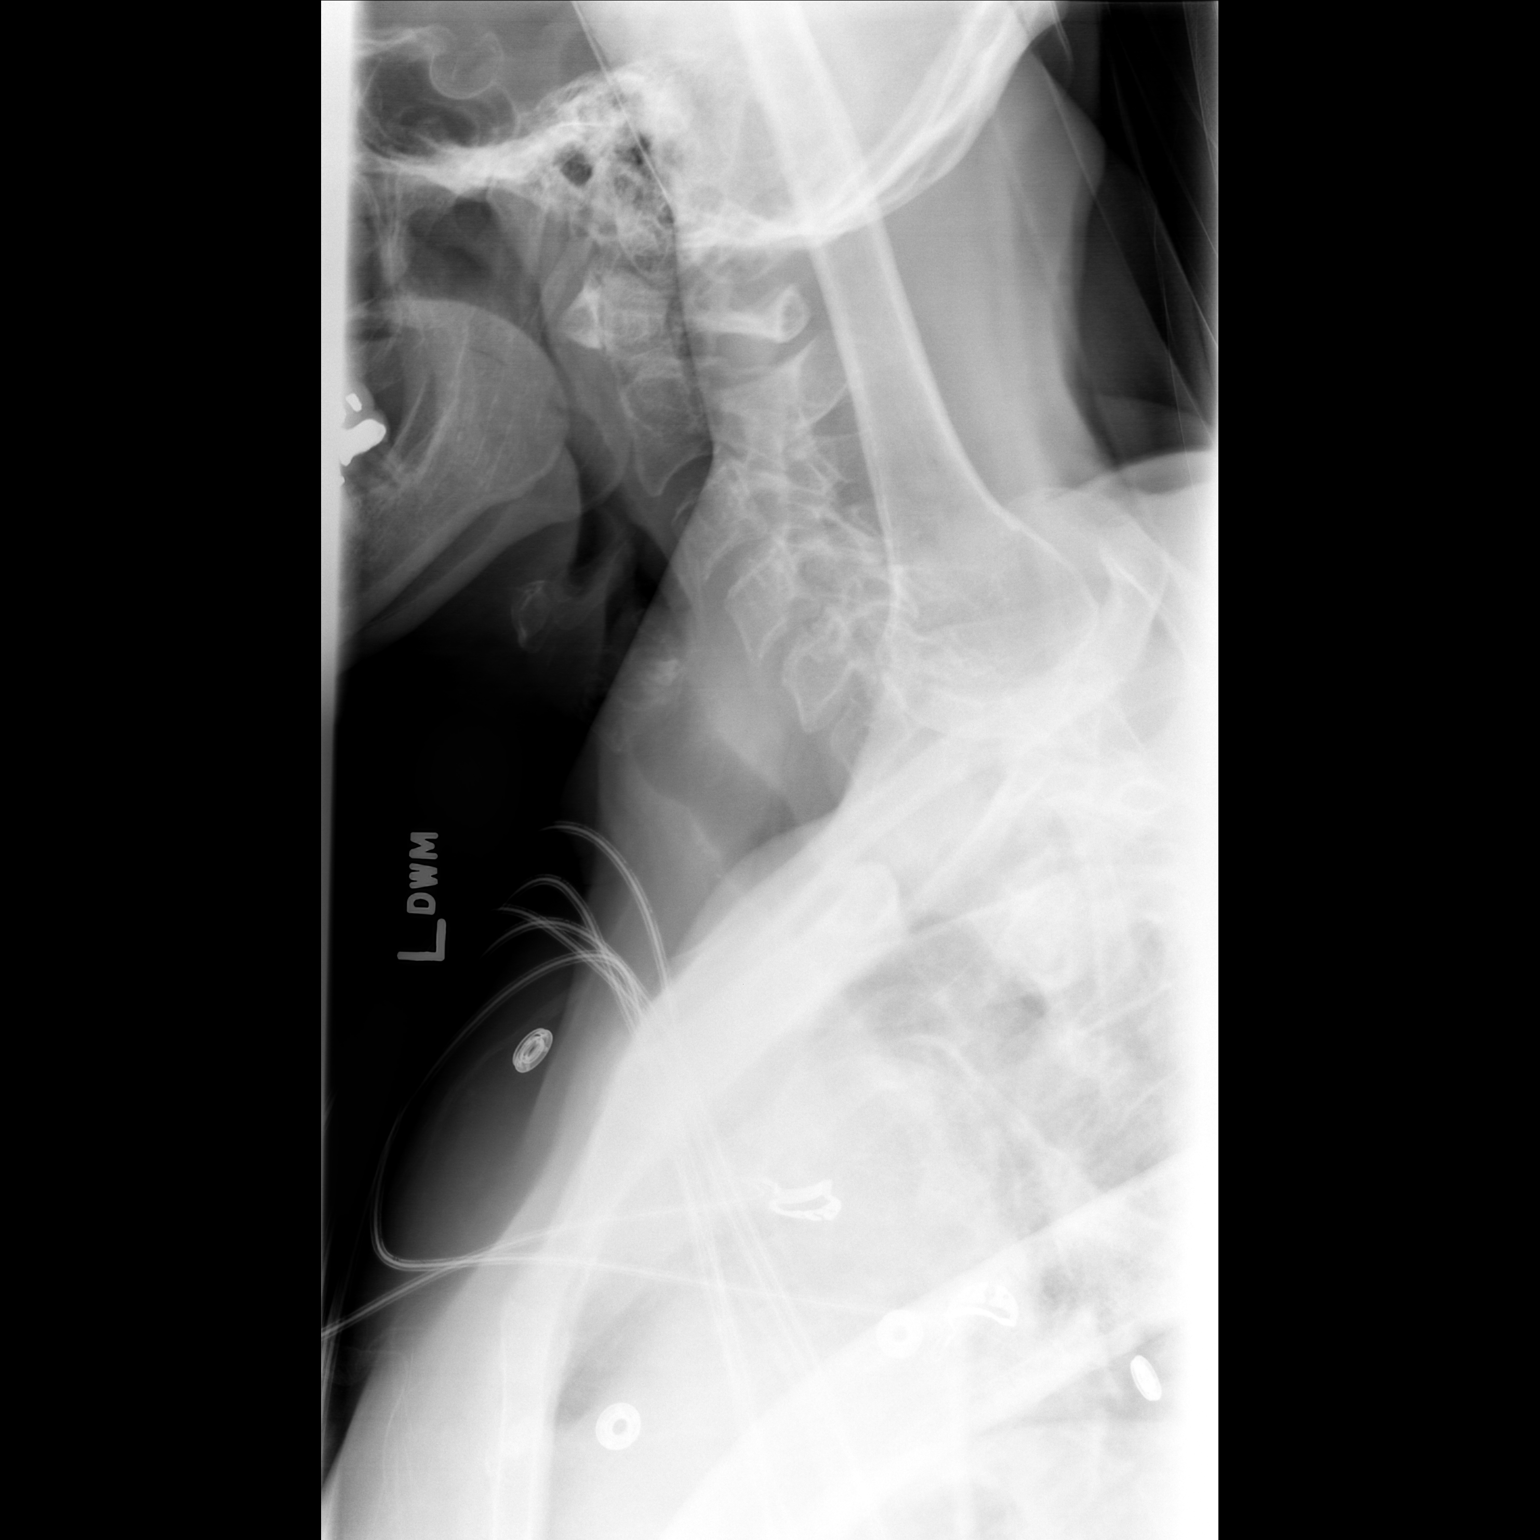

[2 of 2 positions shown; findings below may reference images not displayed]

FINDINGS: Preserved cervical lordosis. Cervicothoracic junction
alignment is within normal limits.  Normal prevertebral soft
tissues.  Relatively preserved disc spaces.  Spinous processes and
posterior elements appear normally aligned.
IMPRESSION: Negative cross-table lateral and swimmer's views of the cervical
spine.

## 2011-05-18 ENCOUNTER — Inpatient Hospital Stay (HOSPITAL_COMMUNITY): Payer: Medicare Other

## 2011-05-19 ENCOUNTER — Inpatient Hospital Stay (HOSPITAL_COMMUNITY): Payer: Medicare Other

## 2011-05-19 IMAGING — CT CT HEAD W/O CM
1 of 2 series · 13 of 30 positions shown, 17 images · non-contrast
Comparison: Multiple priors.  Original scan [DATE].

CLINICAL DATA: Follow-up intracranial hemorrhage. The patient fell
backwards on [DATE].

CT HEAD WITHOUT CONTRAST
TECHNIQUE: Contiguous axial images were obtained from the base of
the skull through the vertex without contrast.

[Series 2: brain · axial · 0.47mm/px · z∈[+110,+248]mm · 13 of 28 slices shown, 17 images]
[im 2/28  brain]
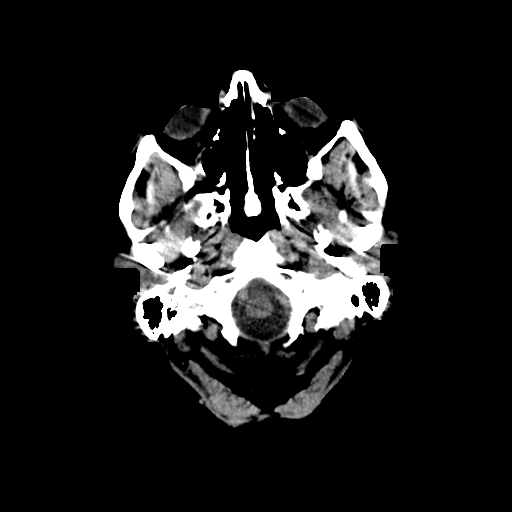
[im 2/28  bone]
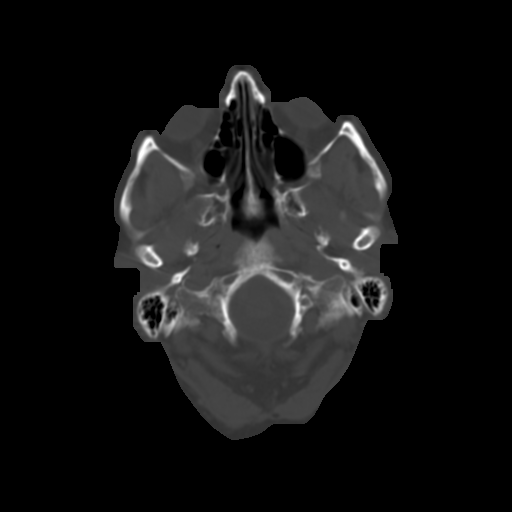
[im 4/28  brain]
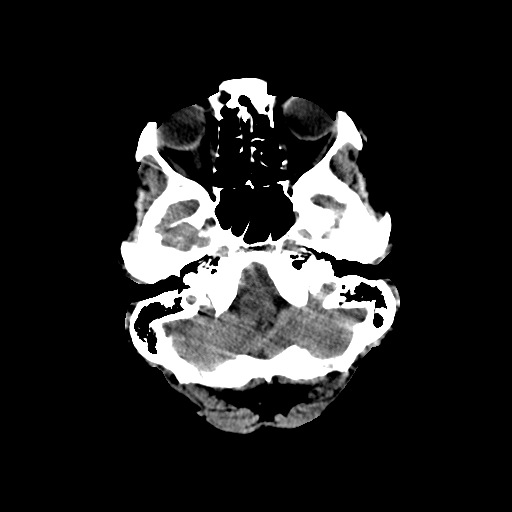
[im 6/28  brain]
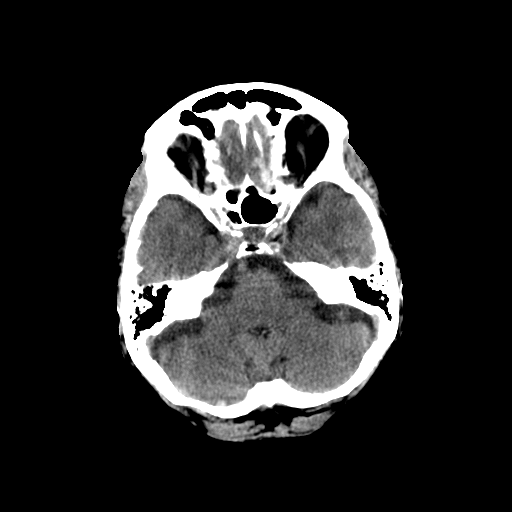
[im 8/28  brain]
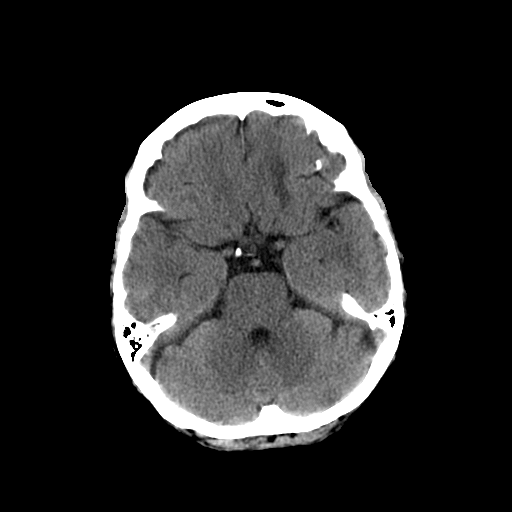
[im 10/28  brain]
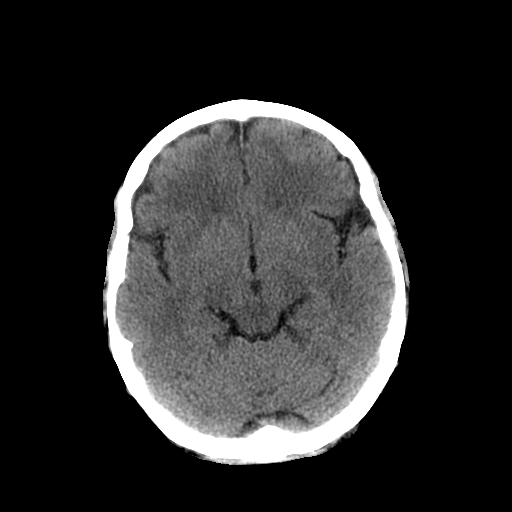
[im 10/28  bone]
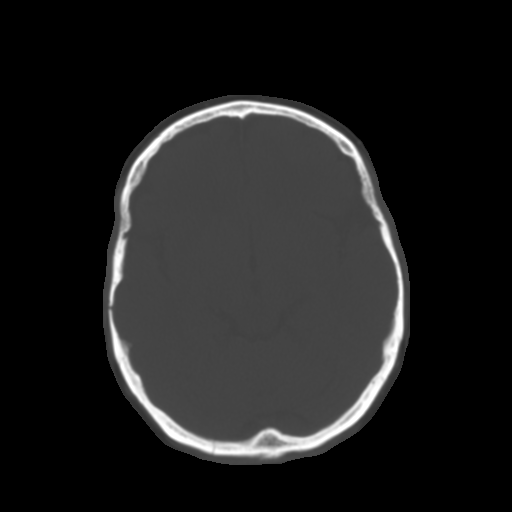
[im 12/28  brain]
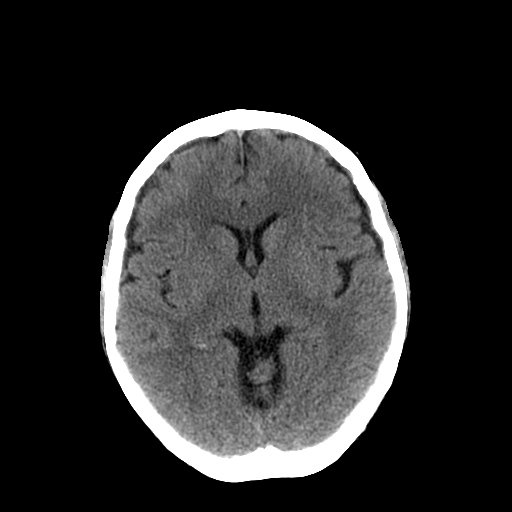
[im 14/28  brain]
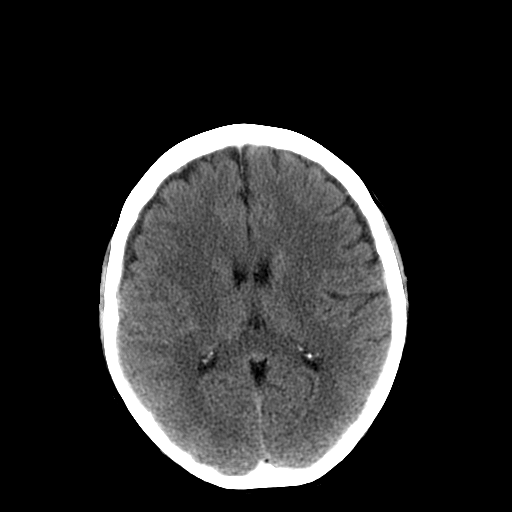
[im 16/28  brain]
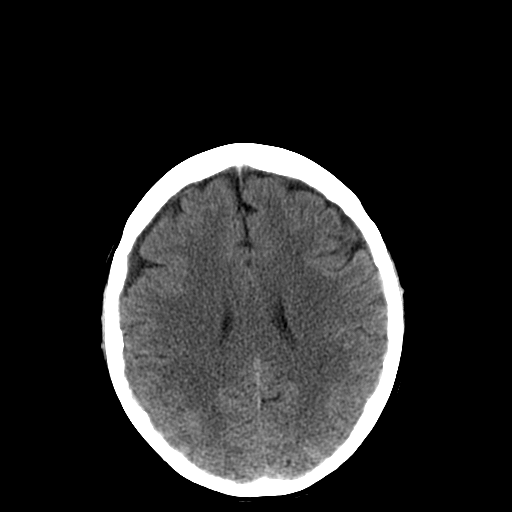
[im 18/28  brain]
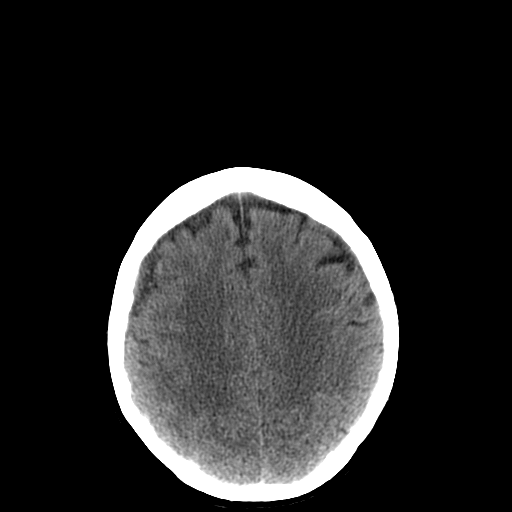
[im 18/28  bone]
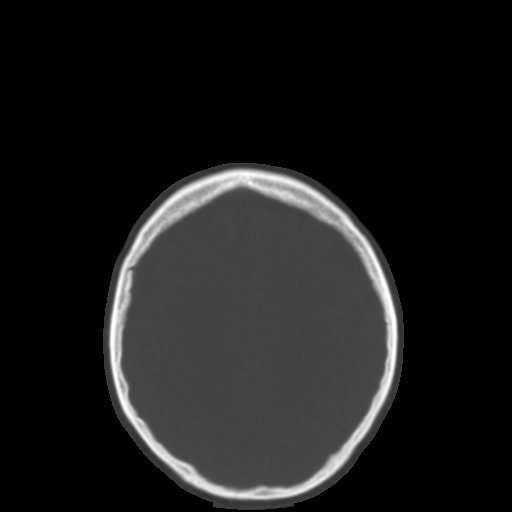
[im 20/28  brain]
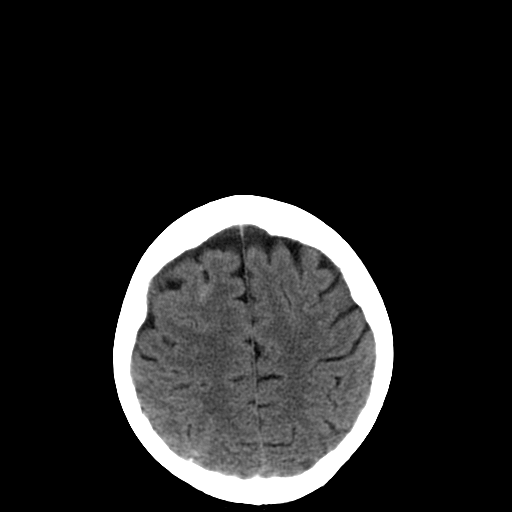
[im 22/28  brain]
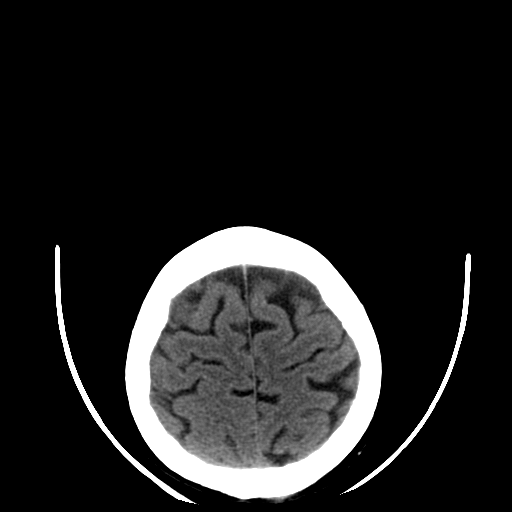
[im 24/28  brain]
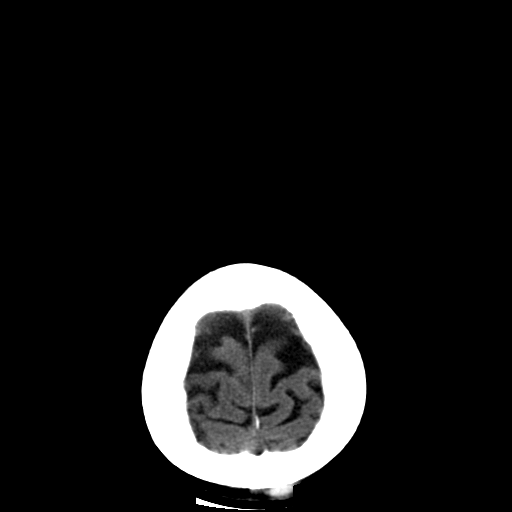
[im 26/28  brain]
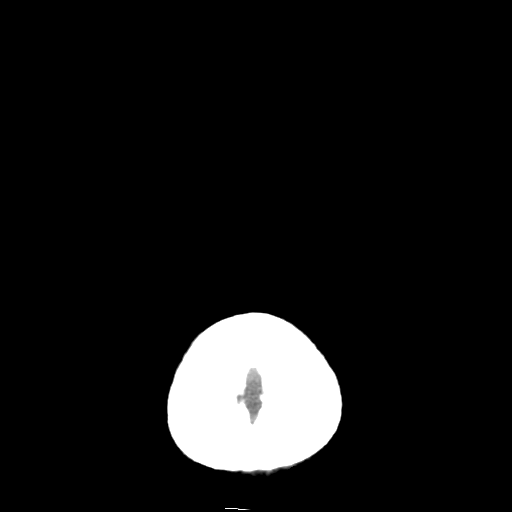
[im 26/28  bone]
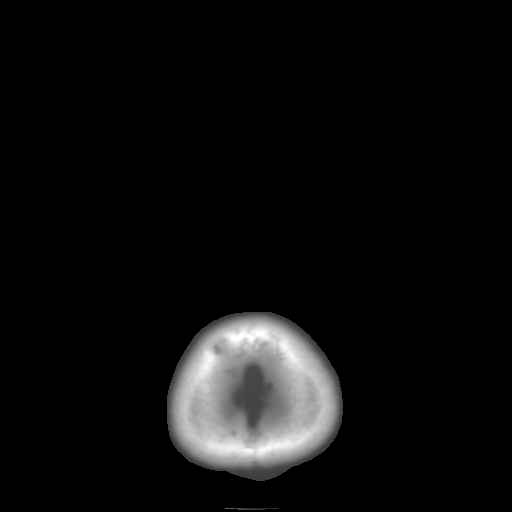

[13 of 30 positions shown; findings below may reference images not displayed]

FINDINGS: Right occipital skull fracture redemonstrated and stable.
Improved right subdural hematoma.  Small bilateral subdural
hygromas have developed, right larger than left.  Maximum thickness
5-6 mm on the right.  Left subfrontal hemorrhage which roughly
follows the contour of the left gyrus rectus redemonstrated and
grossly stable.  No new hemorrhage is identified.  Clear sinuses
and mastoids.
IMPRESSION: Right subdural hematoma is improved, although small bilateral
subdural hygromas are developing since most recent prior.
Unchanged right occipital bone fracture.  Unchanged left subfrontal
hematoma.

## 2011-05-21 NOTE — H&P (Signed)
  NAMEELICE, CRIGGER               ACCOUNT NO.:  [id number]  MEDICAL RECORD NO.:  [id number]  LOCATION:  3107                         FACILITY:  [facility]  PHYSICIAN:  Hilda Lias, M.D.   DATE OF BIRTH:  1934-01-07  DATE OF ADMISSION:  05/15/2011 DATE OF DISCHARGE:                             HISTORY & PHYSICAL   Ms. Marschner is a lady who today about 4:30 p.m. was walking and she had some missed steps, fell, and hit her head.  Immediately, she realized that she had quite a bit of headache.  Because of persistence of headache, she was taken to [facility] facility where CT scan of the head was done.  We were called for consultation.  We decided to transfer her to [facility] ICU.  The patient at the present time is oriented.  She tells me that she does not remember any effect before she fell, that she was walking and she had some missed steps and she went down.  She denies any dizziness prior to that.  PAST MEDICAL HISTORY:  She has a history of hypertension, high cholesterol, and GERD.  SOCIAL HISTORY:  The patient does not smoke, does not drink.  FAMILY HISTORY:  Unremarkable.  REVIEW OF SYSTEMS:  Positive for headache in the past.  She denies any dizziness.  She is allergic to: 1. PENICILLIN. 2. CODEINE. 3. OXYCODONE. 4. VANCOMYCIN.  She is taking aspirin, estrogen, hydrochlorothiazide, omeprazole, simvastatin, and multiple vitamins.  PHYSICAL EXAMINATION:  VITAL SIGNS:  Blood pressure 160/76, pulse of 86, and temperature 98.6. HEENT:  As above, she has some tenderness in the occipital area.  There is no evidence of any blood or CSF coming from the ear or nose. NECK:  She is in a cervical collar.  She has no tenderness. LUNGS:  Clear. HEART:  Heart sounds normal. ABDOMEN:  Normal. EXTREMITIES:  Normal pulse. NEURO:  She is oriented x3.  Cranial nerves are normal.  Strength normal in the upper and lower extremities.  Reflexes are  symmetrical. Sensation is normal.  CT scan showed a tiny subdural hematoma, acute in the frontal area. There is no evidence of any shift.  There is a nondisplaced fracture of the occipital bone.  There was a small contusion of the right occipital area.  CLINICAL IMPRESSION:  Closed head injury with a small subdural hematoma and nondisplaced fracture of the occipital bone.  RECOMMENDATION:  I talked to her and her husband who was present.  I talked to her at length there is no need for surgery.  We are going to observe her.  We are going to repeat the x-ray in the next 24 hours. Most likely, we will see how she does, but I foresee that she can go home if she is stable in the next 2-3 days.          ______________________________ Hilda Lias, M.D.     EB/MEDQ  D:  05/16/2011  T:  05/16/2011  Job:  [id number]  Electronically Signed by Hilda Lias M.D. on 05/21/2011 04:54:09 PM

## 2011-05-26 ENCOUNTER — Other Ambulatory Visit: Payer: Self-pay | Admitting: Neurosurgery

## 2011-05-26 DIAGNOSIS — S065X9A Traumatic subdural hemorrhage with loss of consciousness of unspecified duration, initial encounter: Secondary | ICD-10-CM

## 2011-05-27 NOTE — Discharge Summary (Signed)
  Sheila Hanson, Sheila Hanson               ACCOUNT NO.:  [id number]  MEDICAL RECORD NO.:  [id number]  LOCATION:  3032                         FACILITY:  [facility]  PHYSICIAN:  Hilda Lias, M.D.   DATE OF BIRTH:  February 18, 1934  DATE OF ADMISSION:  05/15/2011 DATE OF DISCHARGE:  05/21/2011                              DISCHARGE SUMMARY   ADMISSION DIAGNOSIS:  Closed head injury with traumatic subarachnoid hemorrhage.  FINAL DIAGNOSIS:  Closed head injury with traumatic subarachnoid hemorrhage.  CLINICAL HISTORY:  The patient was brought to the emergency room at [facility] location because of a fall with hitting her head. She had a CT scan with a traumatic subarachnoid hemorrhage.  She was transferred to the emergency room.  Laboratory within normal limits. The patient was seen by me.  She was admitted.  CT scan showed that she has traumatic subarachnoid hemorrhage in the frontal area with a tiny subdural hematoma.  The patient was kept in the ICU, later on transferred to the floor.  Today, she is doing much better.  She has had decreased nausea.  She still has a headache, but it is less than before. Repeat CT scan showed resolving of the traumatic hemorrhage.  She is being discharged to be followed by me in 3 weeks.  CONDITION ON DISCHARGE:  Improved.  MEDICATION:  Vicodin.  DIET:  Regular.  ACTIVITY:  Not to drive for at least 2 weeks.  She is not to do lifting over 10 pounds.  FOLLOWUP:  She will be seen by me in my office in 3 weeks or before as needed.          ______________________________ Hilda Lias, M.D.     EB/MEDQ  D:  05/21/2011  T:  05/21/2011  Job:  [id number]  Electronically Signed by Hilda Lias M.D. on 05/27/2011 01:08:26 PM

## 2011-06-14 ENCOUNTER — Ambulatory Visit
Admission: RE | Admit: 2011-06-14 | Discharge: 2011-06-14 | Disposition: A | Discharge status: Home / Self Care | Payer: Medicare Other | Source: Ambulatory Visit | Attending: Neurosurgery | Admitting: Neurosurgery

## 2011-06-14 DIAGNOSIS — S065X9A Traumatic subdural hemorrhage with loss of consciousness of unspecified duration, initial encounter: Secondary | ICD-10-CM

## 2011-06-14 IMAGING — CT CT HEAD W/O CM
2 series · 16 of 30 positions shown, 20 images · non-contrast
Comparison: [DATE].

CLINICAL DATA: Followup subdural hematoma.

CT HEAD WITHOUT CONTRAST
TECHNIQUE: Contiguous axial images were obtained from the base of
the skull through the vertex without contrast.

[Series 3: head bone · axial · 0.49mm/px · z∈[+35,+77]mm · 3 of 28 slices shown]
[im 2/28  bone]
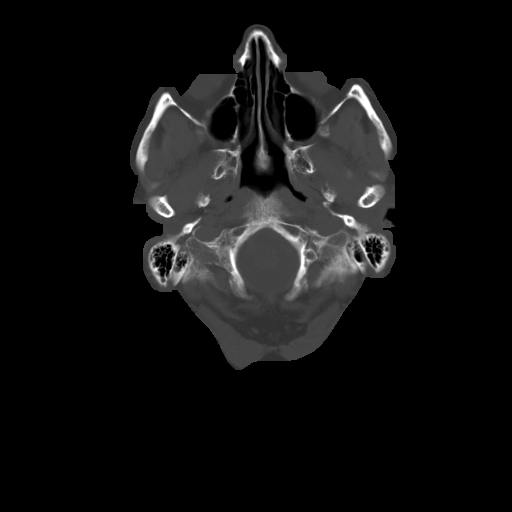
[im 6/28  bone]
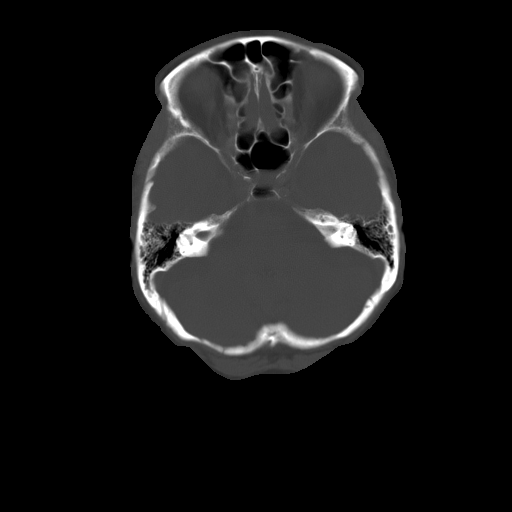
[im 10/28  bone]
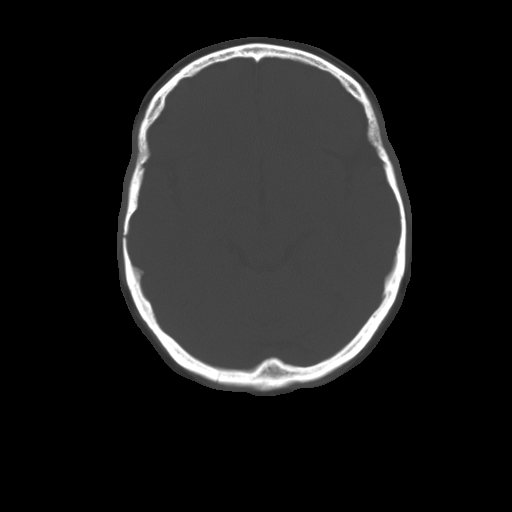

[Series 32: 3d filtered head w/o · axial · non-contrast · 0.49mm/px · z∈[+35,+161]mm · 13 of 28 slices shown, 17 images]
[im 2/28  brain]
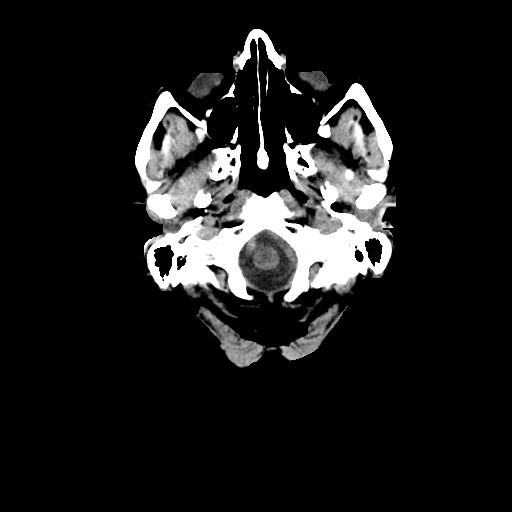
[im 2/28  bone]
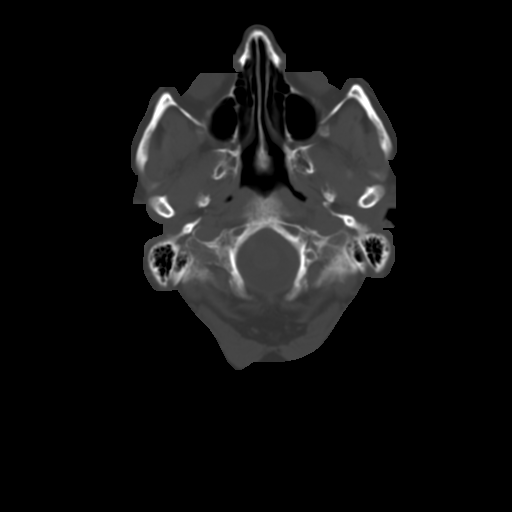
[im 4/28  brain]
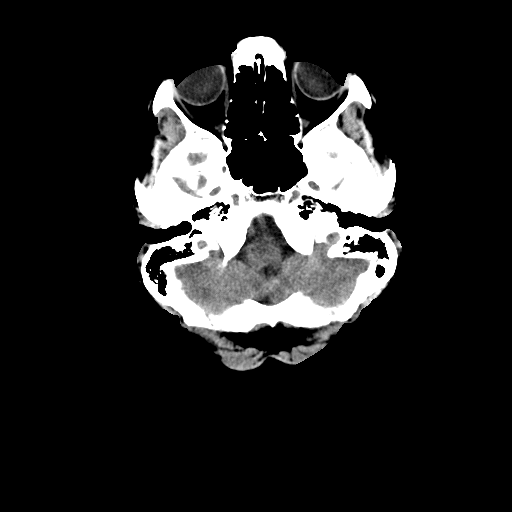
[im 6/28  brain]
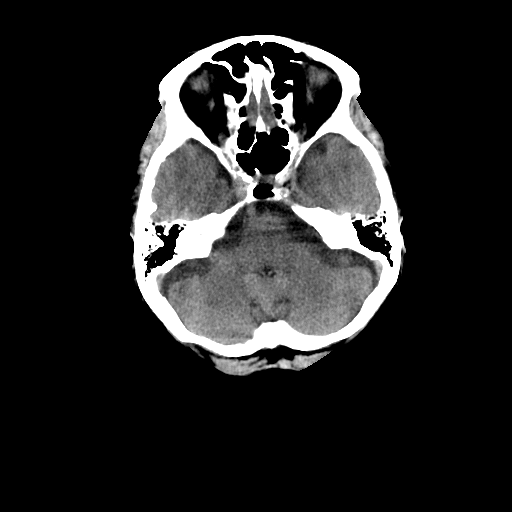
[im 8/28  brain]
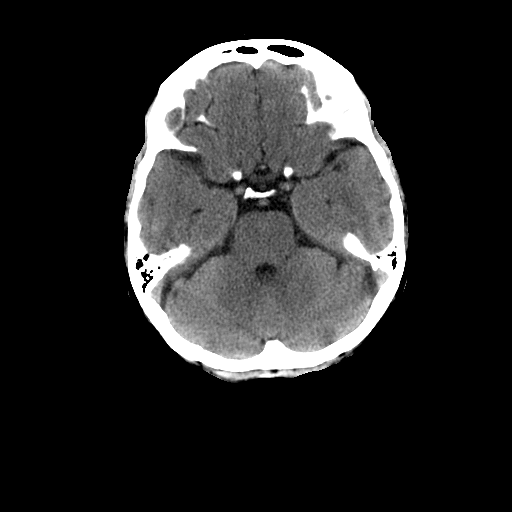
[im 10/28  brain]
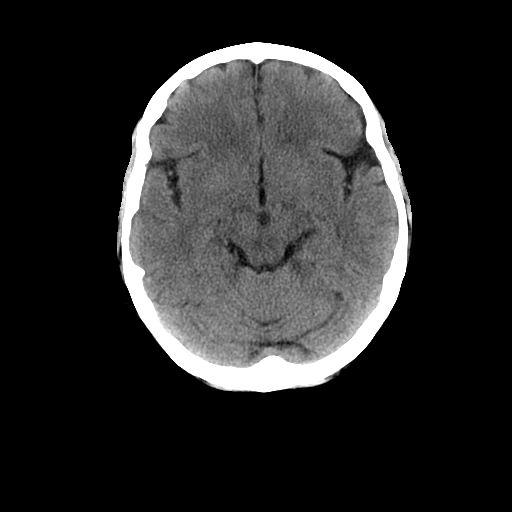
[im 10/28  bone]
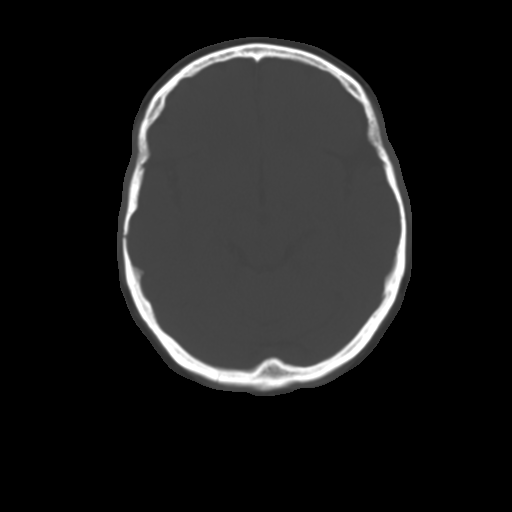
[im 12/28  brain]
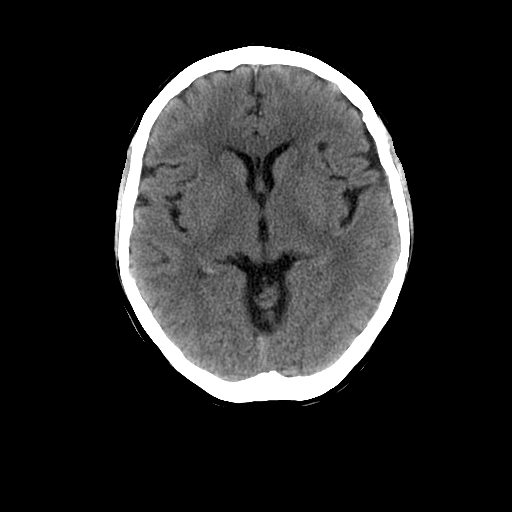
[im 14/28  brain]
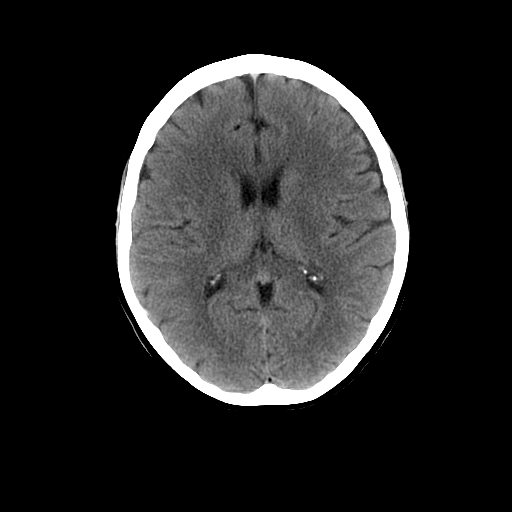
[im 16/28  brain]
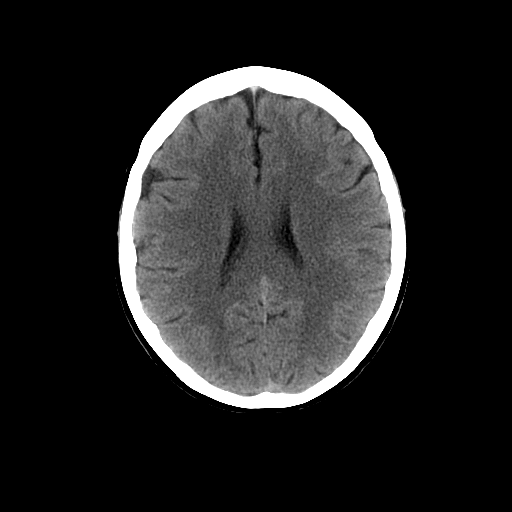
[im 18/28  brain]
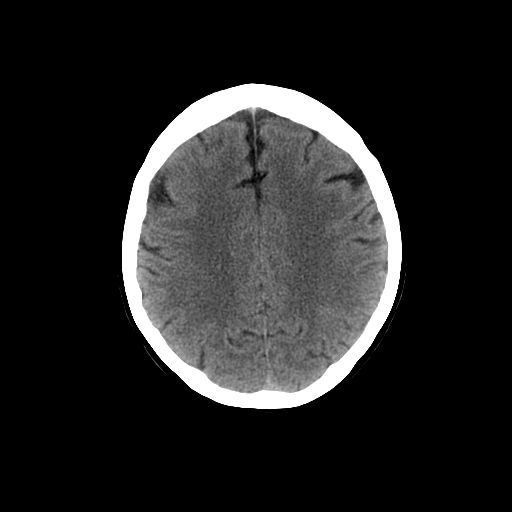
[im 18/28  bone]
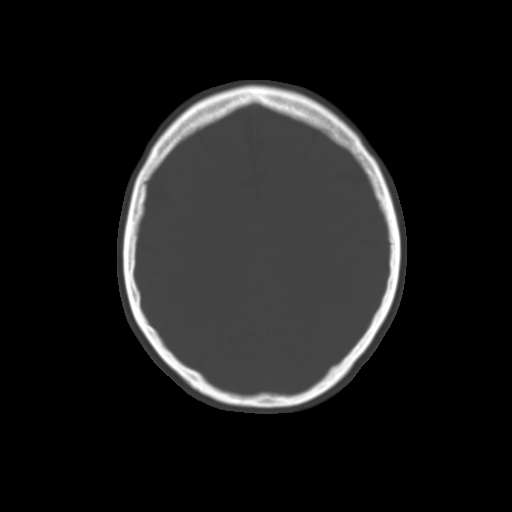
[im 20/28  brain]
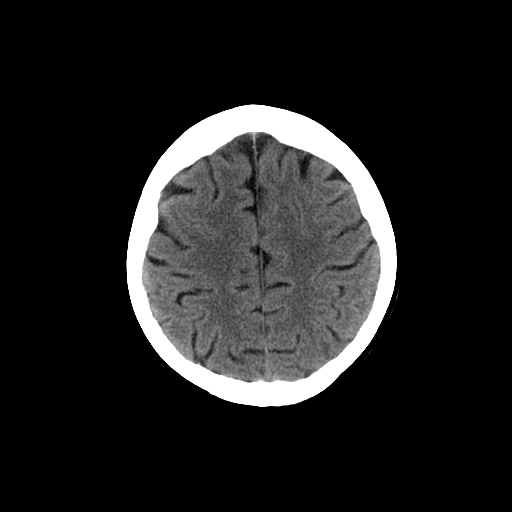
[im 22/28  brain]
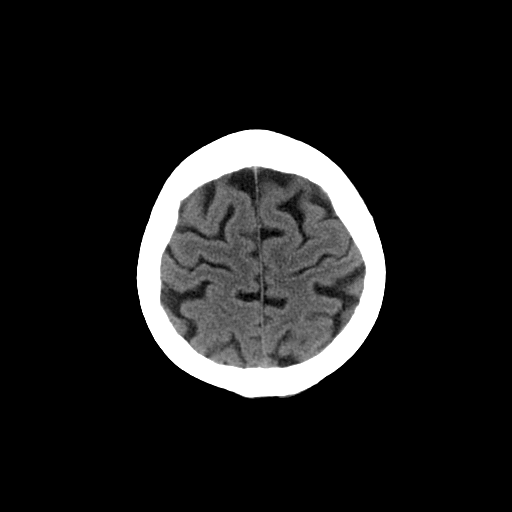
[im 24/28  brain]
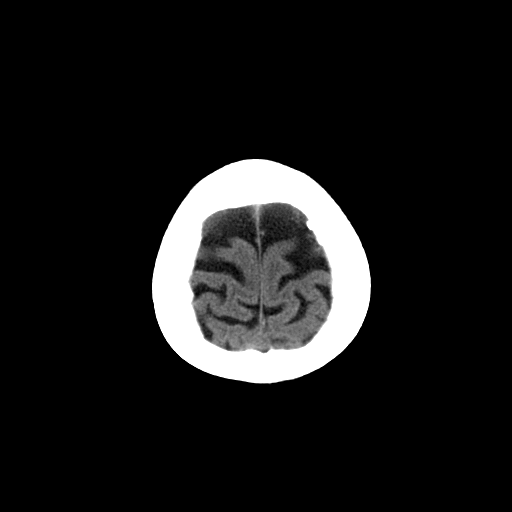
[im 26/28  brain]
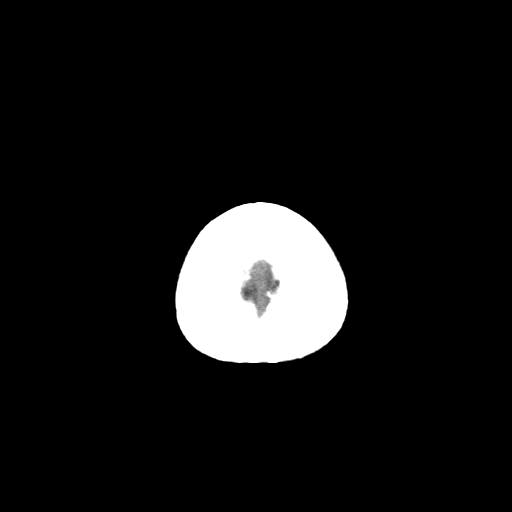
[im 26/28  bone]
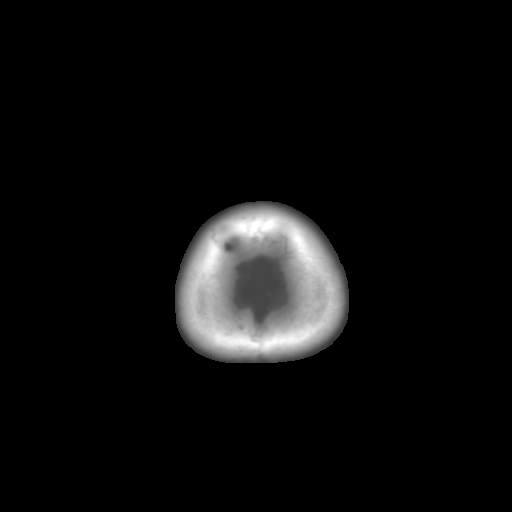

[16 of 30 positions shown; findings below may reference images not displayed]

FINDINGS: Decrease in size of extra-axial collections when compared
to the prior examination without new area of intracranial
hemorrhage noted.

Right parietal - occipital fracture is still well visualized.

Visualized paranasal sinuses, mastoid air cells and middle ear
cavities are clear.

No CT evidence of large acute infarct.  Small acute infarct cannot
be excluded by CT.

No intracranial mass lesion detected on this unenhanced exam.

Vascular calcifications.
IMPRESSION: Decrease in size of previously noted extra-axial collections
without new region of intracranial hemorrhage.

Skull fracture still well visualized.

## 2011-12-08 ENCOUNTER — Other Ambulatory Visit (HOSPITAL_COMMUNITY): Payer: Self-pay | Admitting: Internal Medicine

## 2011-12-08 DIAGNOSIS — Z1231 Encounter for screening mammogram for malignant neoplasm of breast: Secondary | ICD-10-CM

## 2011-12-10 ENCOUNTER — Ambulatory Visit (HOSPITAL_COMMUNITY)
Admission: RE | Admit: 2011-12-10 | Discharge: 2011-12-10 | Disposition: A | Discharge status: Home / Self Care | Payer: Medicare Other | Source: Ambulatory Visit | Attending: Internal Medicine | Admitting: Internal Medicine

## 2011-12-10 DIAGNOSIS — Z1231 Encounter for screening mammogram for malignant neoplasm of breast: Secondary | ICD-10-CM | POA: Diagnosis not present

## 2012-01-12 DIAGNOSIS — L570 Actinic keratosis: Secondary | ICD-10-CM | POA: Diagnosis not present

## 2012-01-12 DIAGNOSIS — C44319 Basal cell carcinoma of skin of other parts of face: Secondary | ICD-10-CM | POA: Diagnosis not present

## 2012-01-24 DIAGNOSIS — E7800 Pure hypercholesterolemia, unspecified: Secondary | ICD-10-CM | POA: Diagnosis not present

## 2012-01-24 DIAGNOSIS — I10 Essential (primary) hypertension: Secondary | ICD-10-CM | POA: Diagnosis not present

## 2012-01-24 DIAGNOSIS — M899 Disorder of bone, unspecified: Secondary | ICD-10-CM | POA: Diagnosis not present

## 2012-02-09 DIAGNOSIS — C44319 Basal cell carcinoma of skin of other parts of face: Secondary | ICD-10-CM | POA: Diagnosis not present

## 2012-07-11 DIAGNOSIS — Z1331 Encounter for screening for depression: Secondary | ICD-10-CM | POA: Diagnosis not present

## 2012-07-11 DIAGNOSIS — Z23 Encounter for immunization: Secondary | ICD-10-CM | POA: Diagnosis not present

## 2012-07-11 DIAGNOSIS — M899 Disorder of bone, unspecified: Secondary | ICD-10-CM | POA: Diagnosis not present

## 2012-07-11 DIAGNOSIS — I10 Essential (primary) hypertension: Secondary | ICD-10-CM | POA: Diagnosis not present

## 2012-07-11 DIAGNOSIS — Z0000 Encounter for general adult medical examination without abnormal findings: Secondary | ICD-10-CM | POA: Diagnosis not present

## 2012-07-11 DIAGNOSIS — E7800 Pure hypercholesterolemia, unspecified: Secondary | ICD-10-CM | POA: Diagnosis not present

## 2012-11-01 DIAGNOSIS — M546 Pain in thoracic spine: Secondary | ICD-10-CM | POA: Diagnosis not present

## 2012-11-13 DIAGNOSIS — M546 Pain in thoracic spine: Secondary | ICD-10-CM | POA: Diagnosis not present

## 2012-11-15 ENCOUNTER — Other Ambulatory Visit: Payer: Self-pay | Admitting: Internal Medicine

## 2012-11-15 ENCOUNTER — Ambulatory Visit
Admission: RE | Admit: 2012-11-15 | Discharge: 2012-11-15 | Disposition: A | Discharge status: Home / Self Care | Payer: Medicare Other | Source: Ambulatory Visit | Attending: Internal Medicine | Admitting: Internal Medicine

## 2012-11-15 ENCOUNTER — Other Ambulatory Visit (HOSPITAL_COMMUNITY): Payer: Self-pay | Admitting: Internal Medicine

## 2012-11-15 DIAGNOSIS — M546 Pain in thoracic spine: Secondary | ICD-10-CM

## 2012-11-15 DIAGNOSIS — R109 Unspecified abdominal pain: Secondary | ICD-10-CM

## 2012-11-15 DIAGNOSIS — K8020 Calculus of gallbladder without cholecystitis without obstruction: Secondary | ICD-10-CM

## 2012-11-15 DIAGNOSIS — R198 Other specified symptoms and signs involving the digestive system and abdomen: Secondary | ICD-10-CM

## 2012-11-15 DIAGNOSIS — K7460 Unspecified cirrhosis of liver: Secondary | ICD-10-CM | POA: Diagnosis not present

## 2012-11-15 IMAGING — CT CT ABD-PELV W/O CM
3 of 4 series · 13 of 36 positions shown, 19 images · IV contrast (OMNI 300/WATER)
Comparison: None

CLINICAL DATA: Right flank pain.

CT ABDOMEN AND PELVIS WITHOUT CONTRAST
TECHNIQUE: Multidetector CT imaging of the abdomen and pelvis was
performed following the standard protocol without intravenous
contrast.

[Series 3: abd/pelvis w/o · axial · non-contrast · 0.87mm/px · z∈[-394,-44]mm · 8 of 92 slices shown, 13 images]
[im 11/92  soft-tissue]
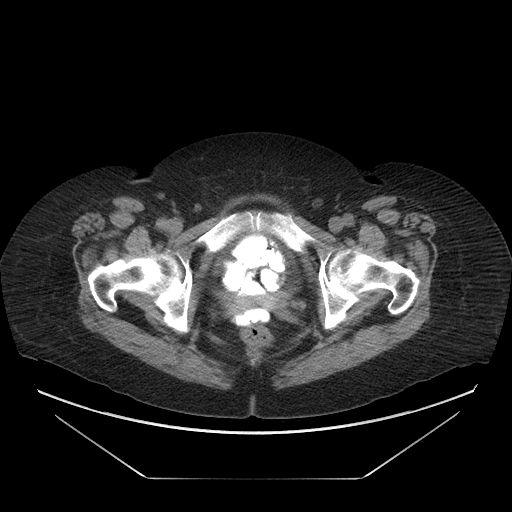
[im 11/92  bone]
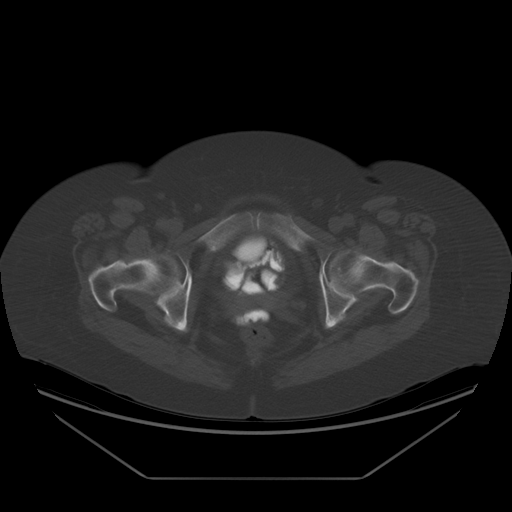
[im 21/92  soft-tissue]
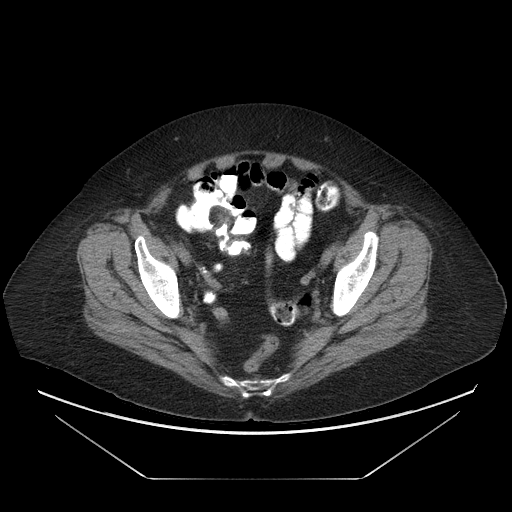
[im 31/92  soft-tissue]
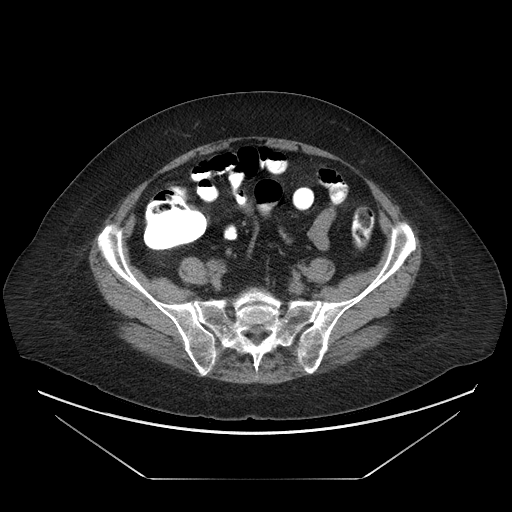
[im 41/92  soft-tissue]
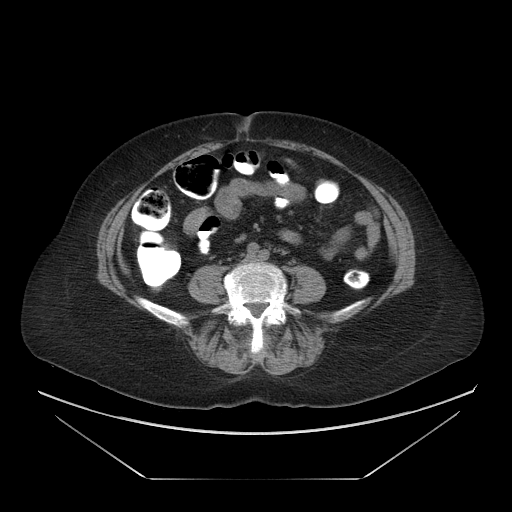
[im 51/92  soft-tissue]
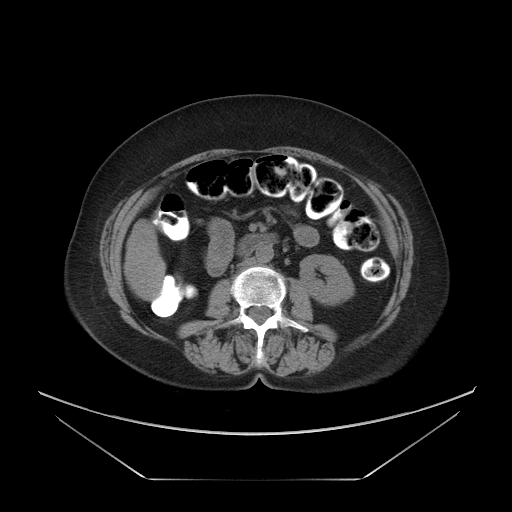
[im 51/92  lung]
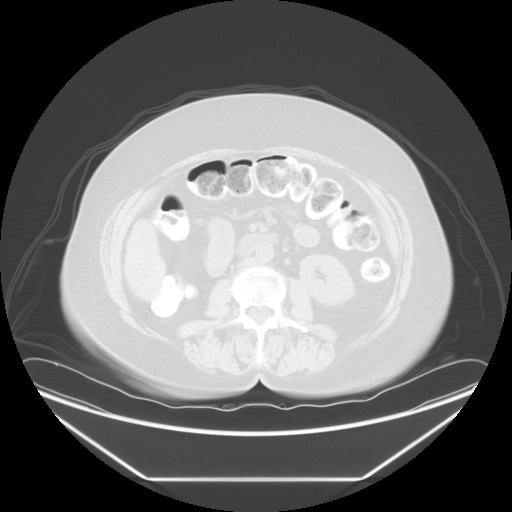
[im 61/92  soft-tissue]
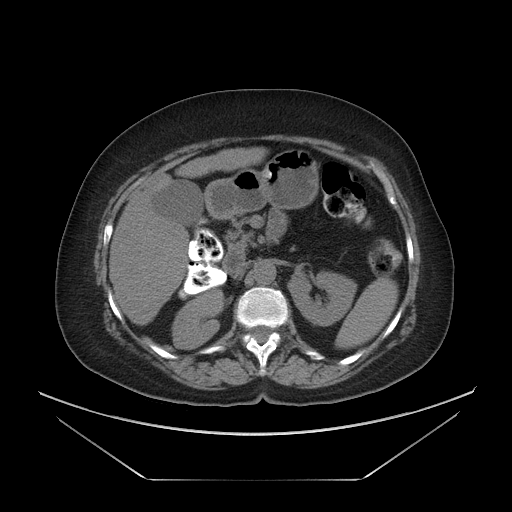
[im 61/92  lung]
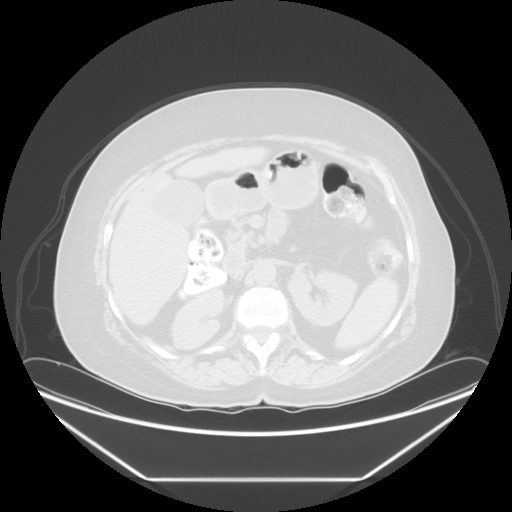
[im 71/92  soft-tissue]
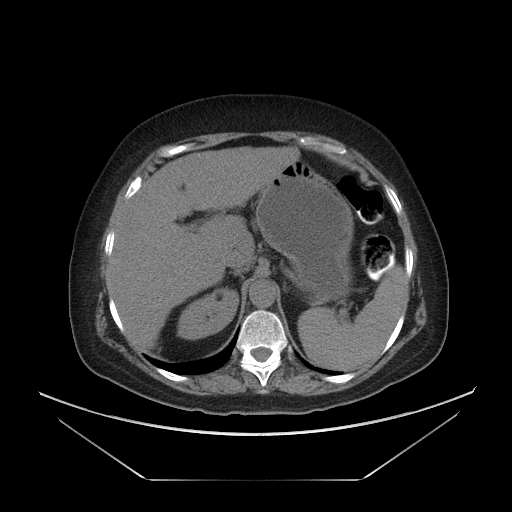
[im 71/92  lung]
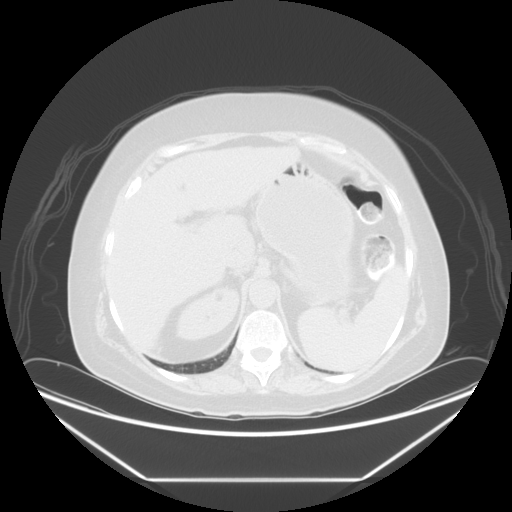
[im 81/92  soft-tissue]
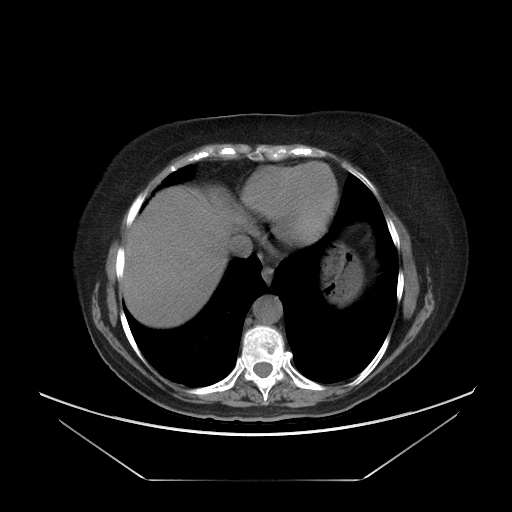
[im 81/92  lung]
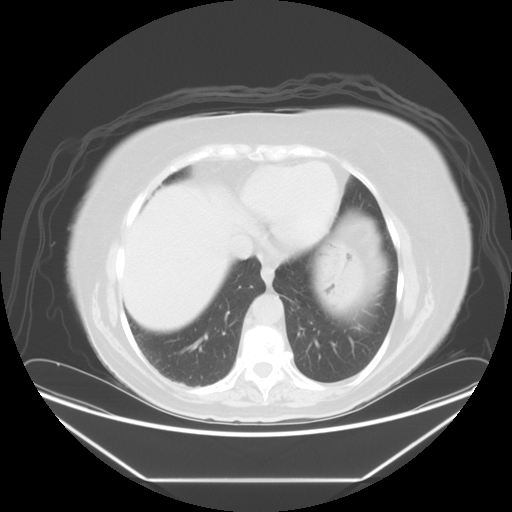

[Series 601: coronal body · coronal · 0.91mm/px · 1 of 119 slices shown, 2 images]
[im 40/119  soft-tissue]
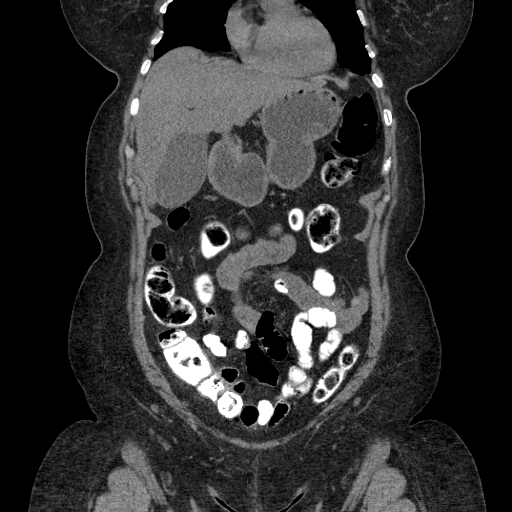
[im 40/119  bone]
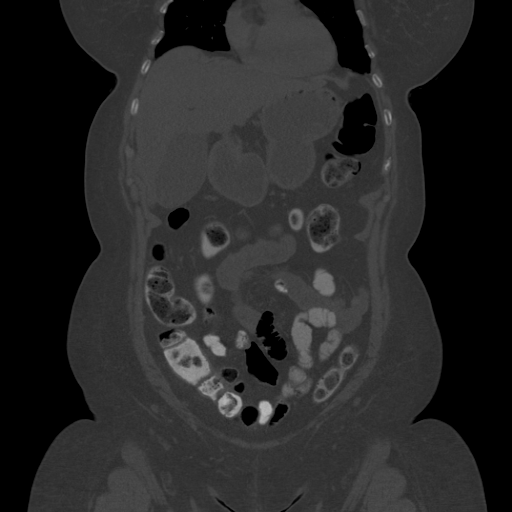

[Series 602: sagittal body · sagittal · 0.91mm/px · 4 of 179 slices shown]
[im 20/179  soft-tissue]
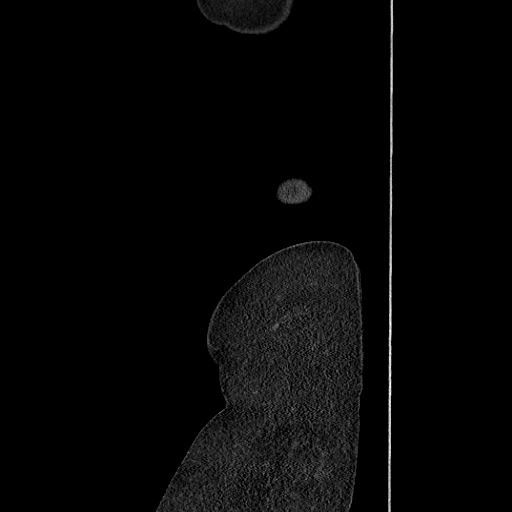
[im 40/179  soft-tissue]
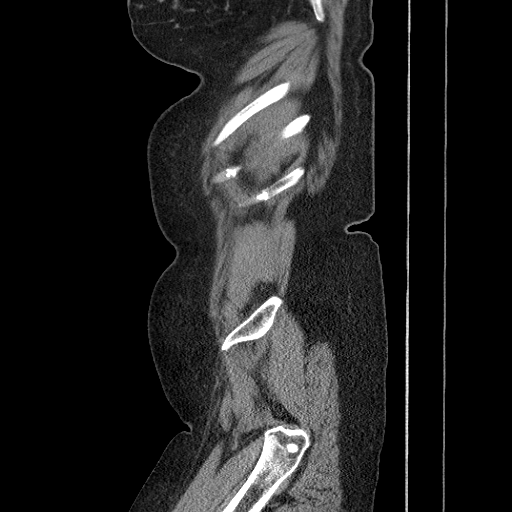
[im 60/179  soft-tissue]
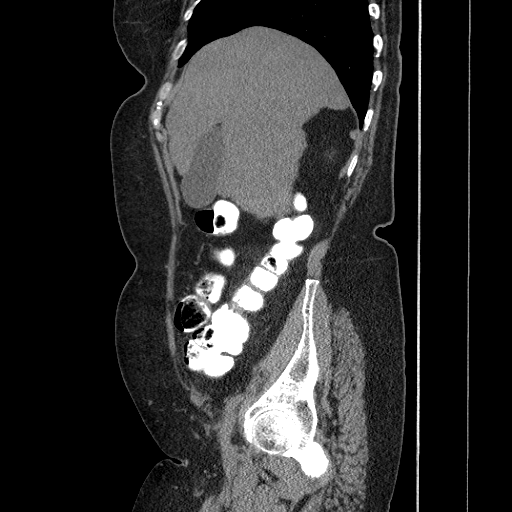
[im 80/179  soft-tissue]
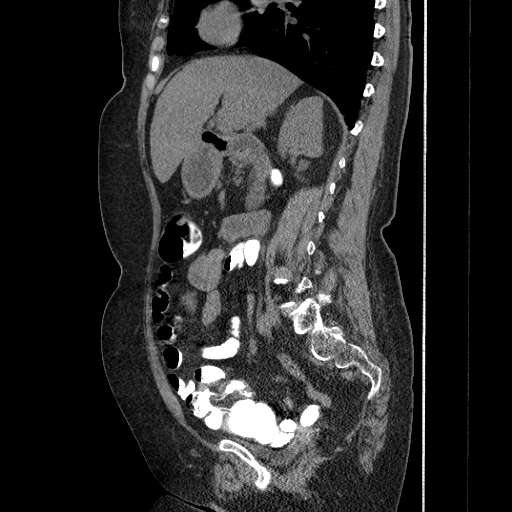

[13 of 36 positions shown; findings below may reference images not displayed]

FINDINGS: The lung bases are clear.  No pleural effusion or
pulmonary nodule.

There are cirrhotic changes involving the liver which has a very
nodular contour.  No focal hepatic lesions or intrahepatic ductal
dilatation.  There is a 2 cm calcified gallstone noted the
gallbladder.  The common bile duct is normal in caliber.  The
pancreas is grossly normal.  The spleen is normal in size.  No
focal lesions.  The adrenal glands and kidneys are normal.  No
renal or obstructing ureteral calculi.  No bladder calculi.

The stomach, duodenum, small bowel and colon are unremarkable.  No
inflammatory changes or mass lesions.  The terminal ileum is
unremarkable.  The appendix is normal.  No mesenteric or
retroperitoneal mass or adenopathy.  Small scattered lymph nodes
are noted.  The aorta is normal in caliber.  No significant
atherosclerotic changes.

No pelvic mass, adenopathy or free pelvic fluid collections.  The
uterus is surgically absent.  The bladder is decompressed.  Both
ovaries are still present and appear normal.  No inguinal mass or
hernia.

The bony structures are intact.  Mild degenerative changes
involving both hips.
IMPRESSION: 1.  No acute abdominal/pelvic findings, mass lesions or adenopathy.
2.  Cirrhotic changes involving the liver.
3.  Cholelithiasis with a 2 cm calcified gallstone.

## 2012-11-15 MED ORDER — IOHEXOL 300 MG/ML  SOLN
30.0000 mL | Freq: Once | INTRAMUSCULAR | Status: AC | PRN
  Administered 2012-11-15: 30 mL via INTRAVENOUS

## 2012-11-22 ENCOUNTER — Encounter (HOSPITAL_COMMUNITY)
Admission: RE | Admit: 2012-11-22 | Discharge: 2012-11-22 | Disposition: A | Discharge status: Home / Self Care | Payer: Medicare Other | Source: Ambulatory Visit | Attending: Internal Medicine | Admitting: Internal Medicine

## 2012-11-22 DIAGNOSIS — K8020 Calculus of gallbladder without cholecystitis without obstruction: Secondary | ICD-10-CM | POA: Insufficient documentation

## 2012-11-22 DIAGNOSIS — K828 Other specified diseases of gallbladder: Secondary | ICD-10-CM | POA: Diagnosis not present

## 2012-11-22 IMAGING — NM NM HEPATO W/GB/PHARM/[PERSON_NAME]
2 series · 12 of 12 positions shown · non-contrast
Comparison: CT abdomen pelvis - [DATE]

CLINICAL DATA: Gallstones, evaluate for biliary dyskinesia

NUCLEAR MEDICINE HEPATOBILIARY IMAGING WITH GALLBLADDER EF
TECHNIQUE: Sequential images of the abdomen were obtained [DATE] minutes following intravenous administration of
radiopharmaceutical.  After slow intravenous infusion of
micrograms Cholecystokinin, gallbladder ejection fraction was
determined.
Radiopharmaceutical:  5 mCi [3I] Choletec

[Series 0: hepatobiliary · 3.20mm/px · 6 of 47 frames shown (1 of 2)]
[frame 4/47]
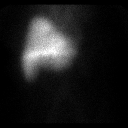
[frame 12/47]
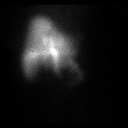
[frame 20/47]
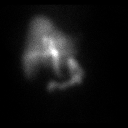
[frame 28/47]
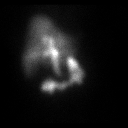
[frame 36/47]
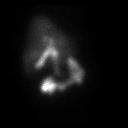
[frame 44/47]
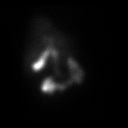

[Series 0: hepatobiliary · 3.20mm/px · 6 of 31 frames shown (2 of 2)]
[frame 3/31]
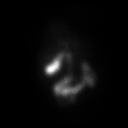
[frame 8/31]
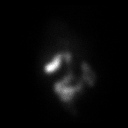
[frame 13/31]
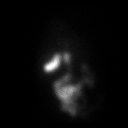
[frame 18/31]
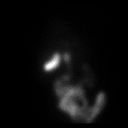
[frame 23/31]
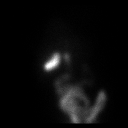
[frame 29/31]
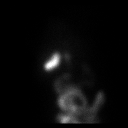

[12 of 12 positions shown; findings below may reference images not displayed]

FINDINGS: There is homogeneous distribution of ingested radiotracer
throughout the hepatic parenchyma.

There is early excretion of radiotracer with opacification patient
of the common bile duct initially seen on the 10-minute anterior
projection planer image.  There is passage of excreted radiotracer
into the small bowel, initially seen on the 15-minute anterior
projection image.

There is early opacification of the gallbladder, initially seen on
the 35 minute anterior projection planar image.

There is delayed excretion of the radiotracer from the gallbladder
following the CCK injection with associated decreased gallbladder
ejection fraction measuring 18.9% (normal value greater >30%).

The patient did not experience symptoms during CCK infusion.
IMPRESSION: 1.  No scintigraphic evidence of acute cholecystitis.
2. Indeterminate findings for biliary dyskinesia with decreased
gallbladder ejection fraction though the patient did not experience
pain during the CCK infusion.

## 2012-11-22 MED ORDER — SINCALIDE 5 MCG IJ SOLR
0.0200 ug/kg | Freq: Once | INTRAMUSCULAR | Status: AC
  Administered 2012-11-22: 1.5 ug via INTRAVENOUS

## 2012-11-22 MED ORDER — SINCALIDE 5 MCG IJ SOLR
INTRAMUSCULAR | Status: AC
  Administered 2012-11-22: 1.5 ug via INTRAVENOUS
  Filled 2012-11-22: qty 5

## 2012-11-22 MED ORDER — TECHNETIUM TC 99M MEBROFENIN IV KIT
5.0000 | PACK | Freq: Once | INTRAVENOUS | Status: AC | PRN
  Administered 2012-11-22: 5 via INTRAVENOUS

## 2012-11-27 ENCOUNTER — Encounter (HOSPITAL_COMMUNITY): Payer: Medicare Other

## 2012-12-19 DIAGNOSIS — K7460 Unspecified cirrhosis of liver: Secondary | ICD-10-CM | POA: Diagnosis not present

## 2012-12-19 DIAGNOSIS — K8020 Calculus of gallbladder without cholecystitis without obstruction: Secondary | ICD-10-CM | POA: Diagnosis not present

## 2012-12-20 DIAGNOSIS — D131 Benign neoplasm of stomach: Secondary | ICD-10-CM | POA: Diagnosis not present

## 2012-12-20 DIAGNOSIS — R1012 Left upper quadrant pain: Secondary | ICD-10-CM | POA: Diagnosis not present

## 2012-12-20 DIAGNOSIS — R1013 Epigastric pain: Secondary | ICD-10-CM | POA: Diagnosis not present

## 2013-01-16 DIAGNOSIS — E7800 Pure hypercholesterolemia, unspecified: Secondary | ICD-10-CM | POA: Diagnosis not present

## 2013-01-16 DIAGNOSIS — I10 Essential (primary) hypertension: Secondary | ICD-10-CM | POA: Diagnosis not present

## 2013-01-16 DIAGNOSIS — K7460 Unspecified cirrhosis of liver: Secondary | ICD-10-CM | POA: Diagnosis not present

## 2013-01-22 ENCOUNTER — Other Ambulatory Visit (HOSPITAL_COMMUNITY): Payer: Self-pay | Admitting: Internal Medicine

## 2013-01-22 DIAGNOSIS — Z1231 Encounter for screening mammogram for malignant neoplasm of breast: Secondary | ICD-10-CM

## 2013-03-02 DIAGNOSIS — M1710 Unilateral primary osteoarthritis, unspecified knee: Secondary | ICD-10-CM | POA: Diagnosis not present

## 2013-03-02 DIAGNOSIS — M25559 Pain in unspecified hip: Secondary | ICD-10-CM | POA: Diagnosis not present

## 2013-03-05 ENCOUNTER — Emergency Department (HOSPITAL_BASED_OUTPATIENT_CLINIC_OR_DEPARTMENT_OTHER): Payer: No Typology Code available for payment source

## 2013-03-05 ENCOUNTER — Emergency Department (HOSPITAL_BASED_OUTPATIENT_CLINIC_OR_DEPARTMENT_OTHER)
Admission: EM | Admit: 2013-03-05 | Discharge: 2013-03-05 | Disposition: A | Discharge status: Home / Self Care | Payer: No Typology Code available for payment source | Attending: Emergency Medicine | Admitting: Emergency Medicine

## 2013-03-05 ENCOUNTER — Encounter (HOSPITAL_BASED_OUTPATIENT_CLINIC_OR_DEPARTMENT_OTHER): Payer: Self-pay | Admitting: *Deleted

## 2013-03-05 DIAGNOSIS — S199XXA Unspecified injury of neck, initial encounter: Secondary | ICD-10-CM | POA: Diagnosis not present

## 2013-03-05 DIAGNOSIS — Z7982 Long term (current) use of aspirin: Secondary | ICD-10-CM | POA: Insufficient documentation

## 2013-03-05 DIAGNOSIS — Z8679 Personal history of other diseases of the circulatory system: Secondary | ICD-10-CM | POA: Diagnosis not present

## 2013-03-05 DIAGNOSIS — Z880 Allergy status to penicillin: Secondary | ICD-10-CM | POA: Diagnosis not present

## 2013-03-05 DIAGNOSIS — K219 Gastro-esophageal reflux disease without esophagitis: Secondary | ICD-10-CM | POA: Insufficient documentation

## 2013-03-05 DIAGNOSIS — E7800 Pure hypercholesterolemia, unspecified: Secondary | ICD-10-CM | POA: Insufficient documentation

## 2013-03-05 DIAGNOSIS — S139XXA Sprain of joints and ligaments of unspecified parts of neck, initial encounter: Secondary | ICD-10-CM | POA: Insufficient documentation

## 2013-03-05 DIAGNOSIS — S161XXA Strain of muscle, fascia and tendon at neck level, initial encounter: Secondary | ICD-10-CM

## 2013-03-05 DIAGNOSIS — Y92410 Unspecified street and highway as the place of occurrence of the external cause: Secondary | ICD-10-CM | POA: Insufficient documentation

## 2013-03-05 DIAGNOSIS — Y939 Activity, unspecified: Secondary | ICD-10-CM | POA: Insufficient documentation

## 2013-03-05 DIAGNOSIS — I10 Essential (primary) hypertension: Secondary | ICD-10-CM | POA: Insufficient documentation

## 2013-03-05 DIAGNOSIS — S0280XA Fracture of other specified skull and facial bones, unspecified side, initial encounter for closed fracture: Secondary | ICD-10-CM | POA: Diagnosis not present

## 2013-03-05 DIAGNOSIS — Z79899 Other long term (current) drug therapy: Secondary | ICD-10-CM | POA: Diagnosis not present
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
## 2013-03-06 NOTE — ED Provider Notes (Signed)
Medical screening examination/treatment/procedure(s) were conducted as a shared visit with non-physician practitioner(s) and myself.  I personally evaluated the patient during the encounter   Rolan Bucco, MD 03/06/13 [phone]

## 2013-03-07 ENCOUNTER — Ambulatory Visit: Payer: Medicare Other | Attending: Orthopedic Surgery | Admitting: Physical Therapy

## 2013-03-07 DIAGNOSIS — IMO0001 Reserved for inherently not codable concepts without codable children: Secondary | ICD-10-CM | POA: Insufficient documentation

## 2013-03-07 DIAGNOSIS — M25559 Pain in unspecified hip: Secondary | ICD-10-CM | POA: Insufficient documentation

## 2013-03-07 DIAGNOSIS — R262 Difficulty in walking, not elsewhere classified: Secondary | ICD-10-CM | POA: Diagnosis not present

## 2013-03-09 ENCOUNTER — Ambulatory Visit: Payer: Medicare Other | Admitting: Physical Therapy

## 2013-03-09 DIAGNOSIS — R262 Difficulty in walking, not elsewhere classified: Secondary | ICD-10-CM | POA: Diagnosis not present

## 2013-03-09 DIAGNOSIS — IMO0001 Reserved for inherently not codable concepts without codable children: Secondary | ICD-10-CM | POA: Diagnosis not present

## 2013-03-09 DIAGNOSIS — M25559 Pain in unspecified hip: Secondary | ICD-10-CM | POA: Diagnosis not present

## 2013-03-12 ENCOUNTER — Ambulatory Visit: Payer: Medicare Other | Admitting: Physical Therapy

## 2013-03-12 DIAGNOSIS — IMO0001 Reserved for inherently not codable concepts without codable children: Secondary | ICD-10-CM | POA: Diagnosis not present

## 2013-03-12 DIAGNOSIS — M25559 Pain in unspecified hip: Secondary | ICD-10-CM | POA: Diagnosis not present

## 2013-03-12 DIAGNOSIS — R262 Difficulty in walking, not elsewhere classified: Secondary | ICD-10-CM | POA: Diagnosis not present

## 2013-03-15 ENCOUNTER — Ambulatory Visit: Payer: Medicare Other | Admitting: Physical Therapy

## 2013-03-15 DIAGNOSIS — R262 Difficulty in walking, not elsewhere classified: Secondary | ICD-10-CM | POA: Diagnosis not present

## 2013-03-15 DIAGNOSIS — M25559 Pain in unspecified hip: Secondary | ICD-10-CM | POA: Diagnosis not present

## 2013-03-15 DIAGNOSIS — IMO0001 Reserved for inherently not codable concepts without codable children: Secondary | ICD-10-CM | POA: Diagnosis not present

## 2013-03-19 ENCOUNTER — Ambulatory Visit: Payer: Medicare Other | Attending: Orthopedic Surgery | Admitting: Physical Therapy

## 2013-03-19 DIAGNOSIS — R262 Difficulty in walking, not elsewhere classified: Secondary | ICD-10-CM | POA: Insufficient documentation

## 2013-03-19 DIAGNOSIS — M25559 Pain in unspecified hip: Secondary | ICD-10-CM | POA: Diagnosis not present

## 2013-03-19 DIAGNOSIS — IMO0001 Reserved for inherently not codable concepts without codable children: Secondary | ICD-10-CM | POA: Diagnosis not present

## 2013-03-21 ENCOUNTER — Ambulatory Visit: Payer: Medicare Other | Admitting: Physical Therapy

## 2013-03-22 DIAGNOSIS — D2370 Other benign neoplasm of skin of unspecified lower limb, including hip: Secondary | ICD-10-CM | POA: Diagnosis not present

## 2013-03-23 DIAGNOSIS — M25559 Pain in unspecified hip: Secondary | ICD-10-CM | POA: Diagnosis not present

## 2013-03-29 ENCOUNTER — Ambulatory Visit: Payer: Medicare Other | Admitting: Physical Therapy

## 2013-04-02 ENCOUNTER — Ambulatory Visit: Payer: Medicare Other | Admitting: Physical Therapy

## 2013-04-04 ENCOUNTER — Ambulatory Visit: Payer: Medicare Other | Admitting: Physical Therapy

## 2013-04-09 ENCOUNTER — Ambulatory Visit: Payer: Medicare Other | Admitting: Physical Therapy

## 2013-04-12 ENCOUNTER — Ambulatory Visit: Payer: Medicare Other | Admitting: Physical Therapy

## 2013-04-17 ENCOUNTER — Ambulatory Visit: Payer: Medicare Other | Attending: Orthopedic Surgery | Admitting: Physical Therapy

## 2013-04-17 DIAGNOSIS — R262 Difficulty in walking, not elsewhere classified: Secondary | ICD-10-CM | POA: Diagnosis not present

## 2013-04-17 DIAGNOSIS — M25559 Pain in unspecified hip: Secondary | ICD-10-CM | POA: Insufficient documentation

## 2013-04-17 DIAGNOSIS — IMO0001 Reserved for inherently not codable concepts without codable children: Secondary | ICD-10-CM | POA: Diagnosis not present

## 2013-04-26 ENCOUNTER — Ambulatory Visit: Payer: Medicare Other | Admitting: Physical Therapy

## 2013-05-09 ENCOUNTER — Ambulatory Visit: Payer: Medicare Other | Admitting: Physical Therapy

## 2013-05-14 ENCOUNTER — Ambulatory Visit: Payer: Medicare Other | Admitting: Physical Therapy

## 2013-05-16 ENCOUNTER — Ambulatory Visit: Payer: Medicare Other | Attending: Orthopedic Surgery | Admitting: Physical Therapy

## 2013-05-16 DIAGNOSIS — M25559 Pain in unspecified hip: Secondary | ICD-10-CM | POA: Diagnosis not present

## 2013-05-16 DIAGNOSIS — IMO0001 Reserved for inherently not codable concepts without codable children: Secondary | ICD-10-CM | POA: Insufficient documentation

## 2013-05-16 DIAGNOSIS — R262 Difficulty in walking, not elsewhere classified: Secondary | ICD-10-CM | POA: Diagnosis not present

## 2013-05-21 ENCOUNTER — Ambulatory Visit: Payer: Medicare Other | Admitting: Physical Therapy

## 2013-05-21 DIAGNOSIS — M25559 Pain in unspecified hip: Secondary | ICD-10-CM | POA: Diagnosis not present

## 2013-05-21 DIAGNOSIS — IMO0001 Reserved for inherently not codable concepts without codable children: Secondary | ICD-10-CM | POA: Diagnosis not present

## 2013-05-21 DIAGNOSIS — R262 Difficulty in walking, not elsewhere classified: Secondary | ICD-10-CM | POA: Diagnosis not present

## 2013-05-28 ENCOUNTER — Ambulatory Visit: Payer: Medicare Other | Admitting: Physical Therapy

## 2013-05-28 DIAGNOSIS — R262 Difficulty in walking, not elsewhere classified: Secondary | ICD-10-CM | POA: Diagnosis not present

## 2013-05-28 DIAGNOSIS — IMO0001 Reserved for inherently not codable concepts without codable children: Secondary | ICD-10-CM | POA: Diagnosis not present

## 2013-05-28 DIAGNOSIS — M25559 Pain in unspecified hip: Secondary | ICD-10-CM | POA: Diagnosis not present

## 2013-06-01 ENCOUNTER — Ambulatory Visit: Payer: Medicare Other | Admitting: Physical Therapy

## 2013-06-01 DIAGNOSIS — M25559 Pain in unspecified hip: Secondary | ICD-10-CM | POA: Diagnosis not present

## 2013-06-01 DIAGNOSIS — R262 Difficulty in walking, not elsewhere classified: Secondary | ICD-10-CM | POA: Diagnosis not present

## 2013-06-01 DIAGNOSIS — IMO0001 Reserved for inherently not codable concepts without codable children: Secondary | ICD-10-CM | POA: Diagnosis not present

## 2013-07-17 DIAGNOSIS — Z1331 Encounter for screening for depression: Secondary | ICD-10-CM | POA: Diagnosis not present

## 2013-07-17 DIAGNOSIS — I10 Essential (primary) hypertension: Secondary | ICD-10-CM | POA: Diagnosis not present

## 2013-07-17 DIAGNOSIS — Z23 Encounter for immunization: Secondary | ICD-10-CM | POA: Diagnosis not present

## 2013-07-17 DIAGNOSIS — K7460 Unspecified cirrhosis of liver: Secondary | ICD-10-CM | POA: Diagnosis not present

## 2013-07-17 DIAGNOSIS — Z0000 Encounter for general adult medical examination without abnormal findings: Secondary | ICD-10-CM | POA: Diagnosis not present

## 2013-08-20 ENCOUNTER — Ambulatory Visit (HOSPITAL_COMMUNITY)
Admission: RE | Admit: 2013-08-20 | Discharge: 2013-08-20 | Disposition: A | Discharge status: Home / Self Care | Payer: Medicare Other | Source: Ambulatory Visit | Attending: Internal Medicine | Admitting: Internal Medicine

## 2013-08-20 DIAGNOSIS — Z1231 Encounter for screening mammogram for malignant neoplasm of breast: Secondary | ICD-10-CM | POA: Insufficient documentation

## 2013-08-30 DIAGNOSIS — H2510 Age-related nuclear cataract, unspecified eye: Secondary | ICD-10-CM | POA: Diagnosis not present

## 2013-10-25 ENCOUNTER — Other Ambulatory Visit: Payer: Self-pay | Admitting: Internal Medicine

## 2013-10-25 DIAGNOSIS — K7460 Unspecified cirrhosis of liver: Secondary | ICD-10-CM

## 2013-10-25 DIAGNOSIS — I10 Essential (primary) hypertension: Secondary | ICD-10-CM | POA: Diagnosis not present

## 2013-10-29 ENCOUNTER — Ambulatory Visit
Admission: RE | Admit: 2013-10-29 | Discharge: 2013-10-29 | Disposition: A | Discharge status: Home / Self Care | Payer: Medicare Other | Source: Ambulatory Visit | Attending: Internal Medicine | Admitting: Internal Medicine

## 2013-10-29 DIAGNOSIS — K7460 Unspecified cirrhosis of liver: Secondary | ICD-10-CM

## 2013-10-29 DIAGNOSIS — K8020 Calculus of gallbladder without cholecystitis without obstruction: Secondary | ICD-10-CM | POA: Diagnosis not present

## 2013-10-29 IMAGING — US US ABDOMEN LIMITED
1 series · 14 of 25 positions shown · non-contrast
Comparison: NM HEPATO W/GB/PHARM/ALDANA MEASUR dated [DATE]; CT
ABD/PELV WO CM dated [DATE]

CLINICAL DATA: Cryptogenic cirrhosis.

EXAM:
US ABDOMEN LIMITED - RIGHT UPPER QUADRANT

[Series 1: us abdomen limited · 0.30mm/px · 14 of 51 slices shown]
[im 1/51]
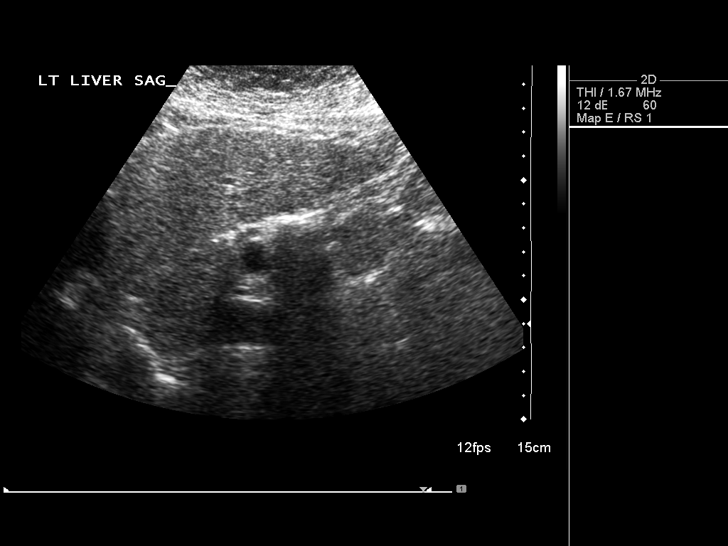
[im 5/51]
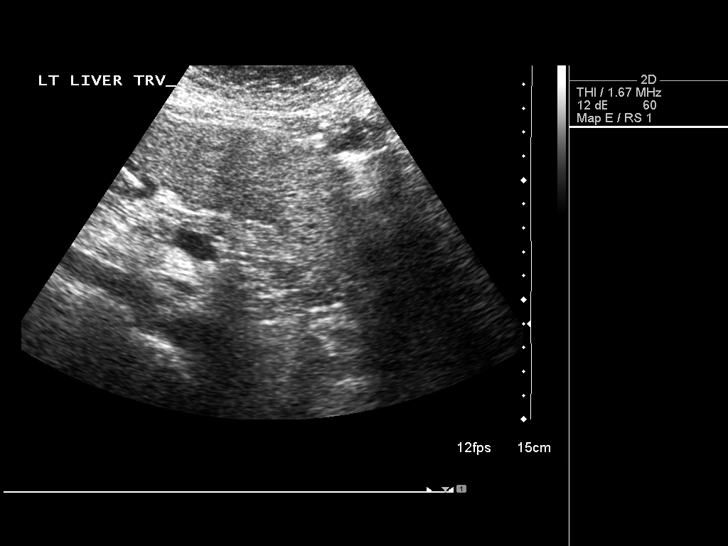
[im 9/51]
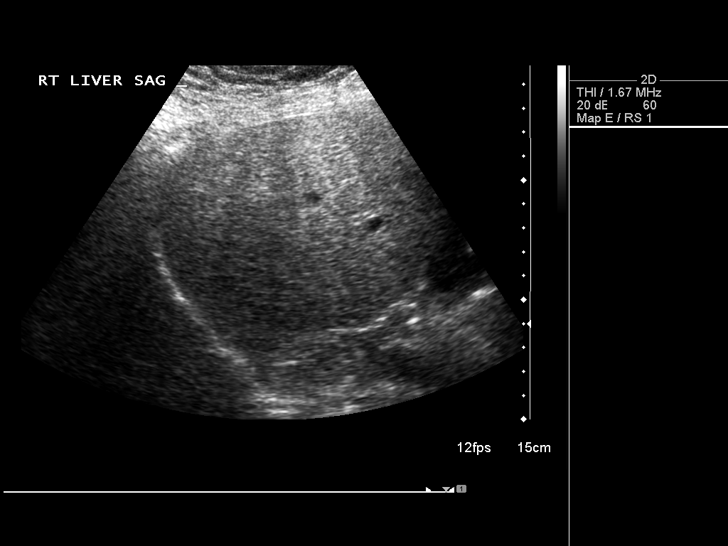
[im 13/51]
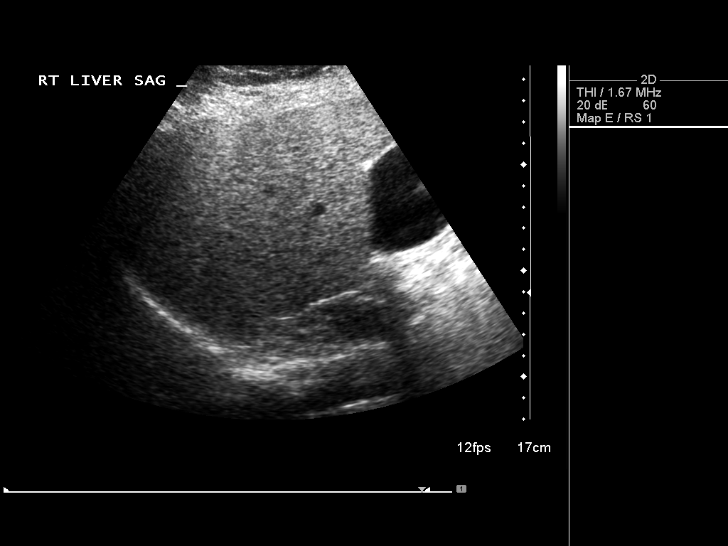
[im 17/51]
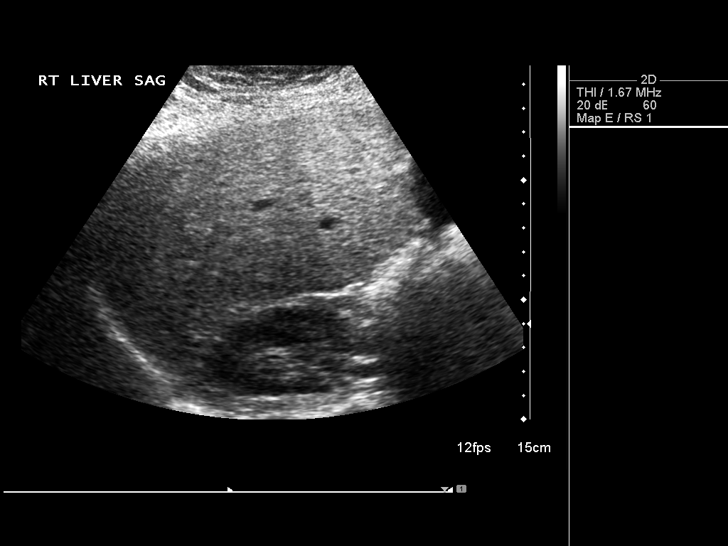
[im 19/51]
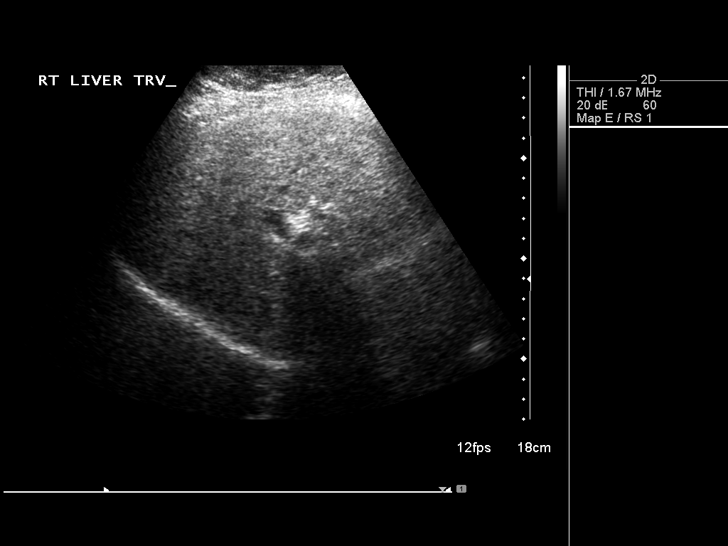
[im 23/51]
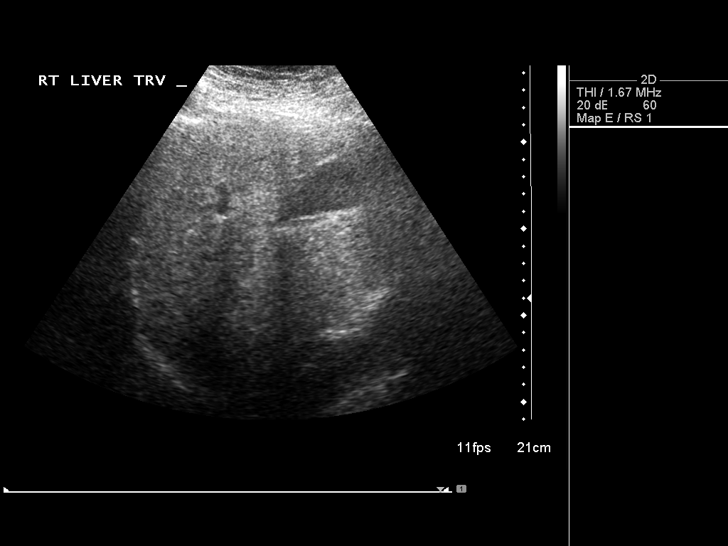
[im 28/51]
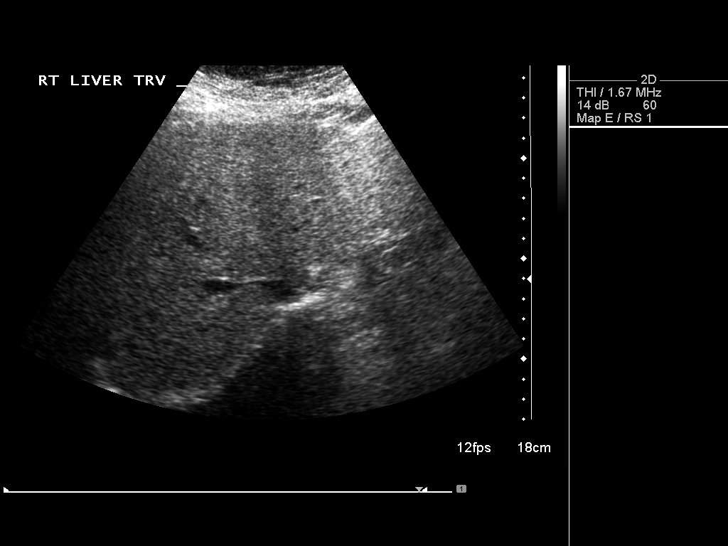
[im 32/51]
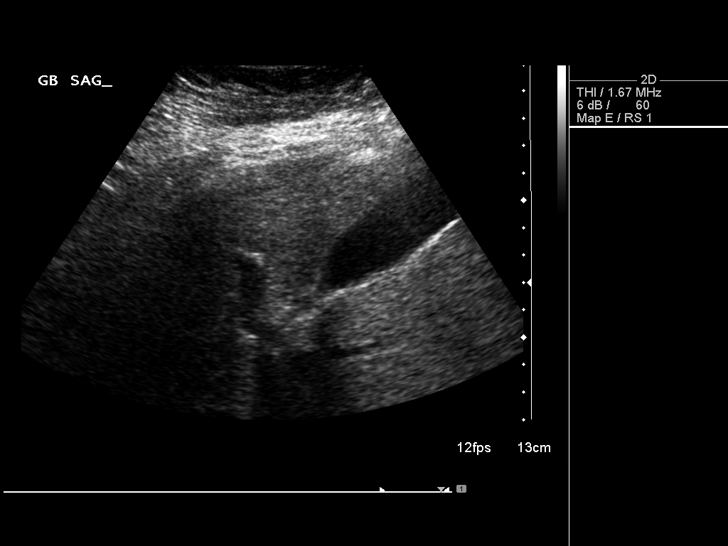
[im 34/51]
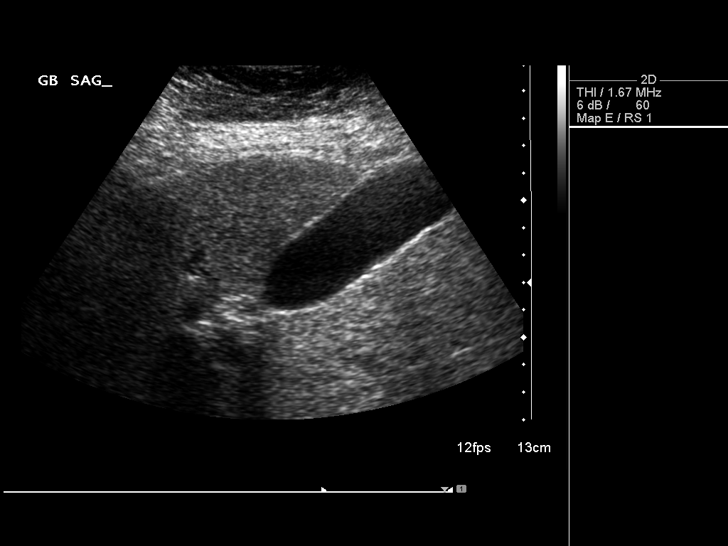
[im 38/51]
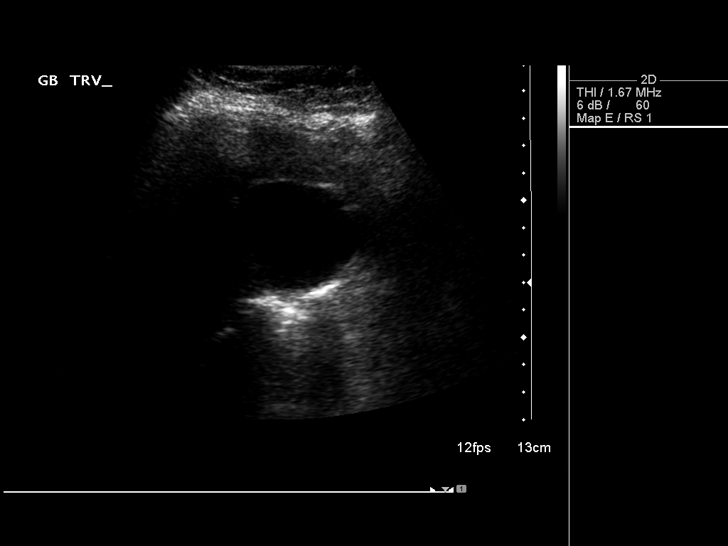
[im 42/51]
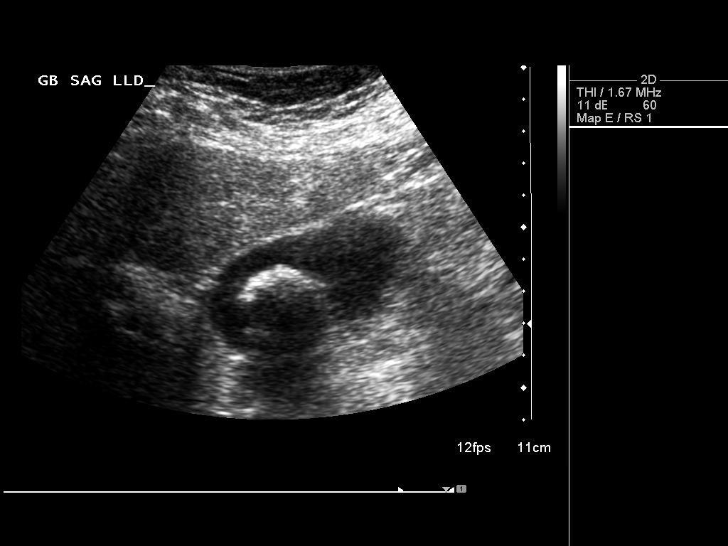
[im 46/51]
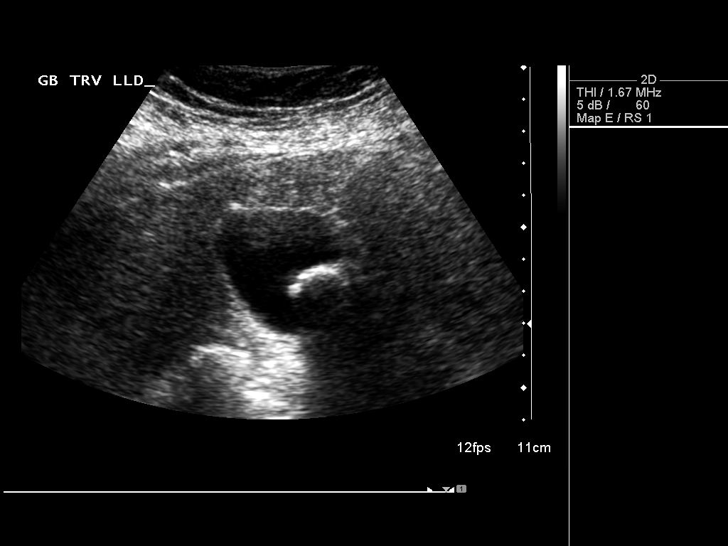
[im 51/51]
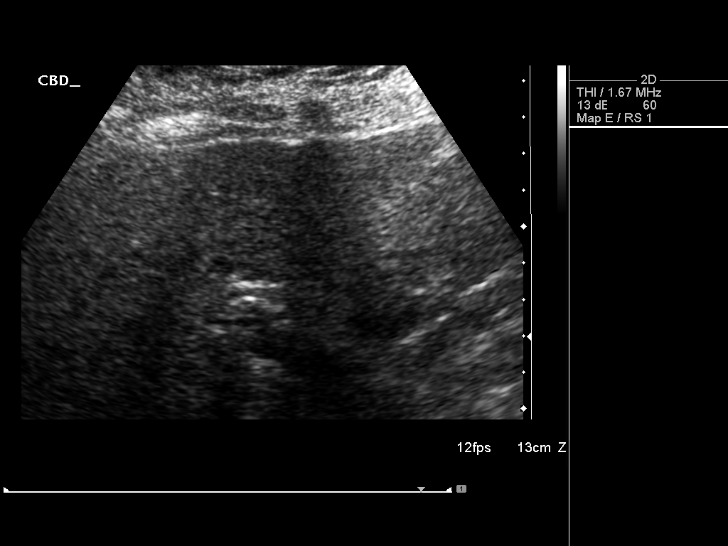

[14 of 25 positions shown; findings below may reference images not displayed]

FINDINGS: Gallbladder:

A 2.7 cm mobile gallstone was seen. There is no gallbladder wall
thickening. Sonographic Murphy's sign was negative.

Common bile duct:

Diameter: 3 mm

Liver:

The liver demonstrated a diffusely coarse, echogenic echotexture
without focal lesion identified.
IMPRESSION: 1. Cholelithiasis without evidence of acute cholecystitis.
2. Coarse, echogenic liver, which is nonspecific but can be seen in
the setting of steatosis or chronic liver disease.

## 2014-01-31 DIAGNOSIS — I10 Essential (primary) hypertension: Secondary | ICD-10-CM | POA: Diagnosis not present

## 2014-01-31 DIAGNOSIS — Z23 Encounter for immunization: Secondary | ICD-10-CM | POA: Diagnosis not present

## 2014-04-01 DIAGNOSIS — D239 Other benign neoplasm of skin, unspecified: Secondary | ICD-10-CM | POA: Diagnosis not present

## 2014-04-01 DIAGNOSIS — D1801 Hemangioma of skin and subcutaneous tissue: Secondary | ICD-10-CM | POA: Diagnosis not present

## 2014-04-01 DIAGNOSIS — L570 Actinic keratosis: Secondary | ICD-10-CM | POA: Diagnosis not present

## 2014-04-01 DIAGNOSIS — Z85828 Personal history of other malignant neoplasm of skin: Secondary | ICD-10-CM | POA: Diagnosis not present

## 2014-04-01 DIAGNOSIS — L821 Other seborrheic keratosis: Secondary | ICD-10-CM | POA: Diagnosis not present

## 2014-04-01 DIAGNOSIS — L719 Rosacea, unspecified: Secondary | ICD-10-CM | POA: Diagnosis not present

## 2014-05-07 DIAGNOSIS — Z23 Encounter for immunization: Secondary | ICD-10-CM | POA: Diagnosis not present

## 2014-05-07 DIAGNOSIS — I10 Essential (primary) hypertension: Secondary | ICD-10-CM | POA: Diagnosis not present

## 2014-05-07 DIAGNOSIS — K117 Disturbances of salivary secretion: Secondary | ICD-10-CM | POA: Diagnosis not present

## 2014-05-07 DIAGNOSIS — E7800 Pure hypercholesterolemia, unspecified: Secondary | ICD-10-CM | POA: Diagnosis not present

## 2014-05-07 DIAGNOSIS — K219 Gastro-esophageal reflux disease without esophagitis: Secondary | ICD-10-CM | POA: Diagnosis not present

## 2014-07-24 DIAGNOSIS — Z0000 Encounter for general adult medical examination without abnormal findings: Secondary | ICD-10-CM | POA: Diagnosis not present

## 2014-07-24 DIAGNOSIS — Z1389 Encounter for screening for other disorder: Secondary | ICD-10-CM | POA: Diagnosis not present

## 2014-07-24 DIAGNOSIS — R7301 Impaired fasting glucose: Secondary | ICD-10-CM | POA: Diagnosis not present

## 2014-07-24 DIAGNOSIS — E780 Pure hypercholesterolemia: Secondary | ICD-10-CM | POA: Diagnosis not present

## 2014-07-24 DIAGNOSIS — M8580 Other specified disorders of bone density and structure, unspecified site: Secondary | ICD-10-CM | POA: Diagnosis not present

## 2014-07-24 DIAGNOSIS — R32 Unspecified urinary incontinence: Secondary | ICD-10-CM | POA: Diagnosis not present

## 2014-07-24 DIAGNOSIS — K7469 Other cirrhosis of liver: Secondary | ICD-10-CM | POA: Diagnosis not present

## 2014-07-24 DIAGNOSIS — I10 Essential (primary) hypertension: Secondary | ICD-10-CM | POA: Diagnosis not present

## 2014-08-05 ENCOUNTER — Other Ambulatory Visit (HOSPITAL_COMMUNITY): Payer: Self-pay | Admitting: Internal Medicine

## 2014-08-05 DIAGNOSIS — Z1231 Encounter for screening mammogram for malignant neoplasm of breast: Secondary | ICD-10-CM

## 2014-08-22 ENCOUNTER — Ambulatory Visit (HOSPITAL_COMMUNITY)
Admission: RE | Admit: 2014-08-22 | Discharge: 2014-08-22 | Disposition: A | Discharge status: Home / Self Care | Payer: Medicare Other | Source: Ambulatory Visit | Attending: Internal Medicine | Admitting: Internal Medicine

## 2014-08-22 DIAGNOSIS — Z1231 Encounter for screening mammogram for malignant neoplasm of breast: Secondary | ICD-10-CM

## 2015-01-24 DIAGNOSIS — R2681 Unsteadiness on feet: Secondary | ICD-10-CM | POA: Diagnosis not present

## 2015-01-24 DIAGNOSIS — R51 Headache: Secondary | ICD-10-CM | POA: Diagnosis not present

## 2015-01-24 DIAGNOSIS — I10 Essential (primary) hypertension: Secondary | ICD-10-CM | POA: Diagnosis not present

## 2015-03-27 DIAGNOSIS — L570 Actinic keratosis: Secondary | ICD-10-CM | POA: Diagnosis not present

## 2015-03-27 DIAGNOSIS — D225 Melanocytic nevi of trunk: Secondary | ICD-10-CM | POA: Diagnosis not present

## 2015-03-27 DIAGNOSIS — Z85828 Personal history of other malignant neoplasm of skin: Secondary | ICD-10-CM | POA: Diagnosis not present

## 2015-03-27 DIAGNOSIS — L821 Other seborrheic keratosis: Secondary | ICD-10-CM | POA: Diagnosis not present

## 2015-03-27 DIAGNOSIS — D2372 Other benign neoplasm of skin of left lower limb, including hip: Secondary | ICD-10-CM | POA: Diagnosis not present

## 2015-07-25 DIAGNOSIS — Z23 Encounter for immunization: Secondary | ICD-10-CM | POA: Diagnosis not present

## 2015-07-25 DIAGNOSIS — Z79899 Other long term (current) drug therapy: Secondary | ICD-10-CM | POA: Diagnosis not present

## 2015-07-25 DIAGNOSIS — R682 Dry mouth, unspecified: Secondary | ICD-10-CM | POA: Diagnosis not present

## 2015-07-25 DIAGNOSIS — R7301 Impaired fasting glucose: Secondary | ICD-10-CM | POA: Diagnosis not present

## 2015-07-25 DIAGNOSIS — Z1389 Encounter for screening for other disorder: Secondary | ICD-10-CM | POA: Diagnosis not present

## 2015-07-25 DIAGNOSIS — Z0001 Encounter for general adult medical examination with abnormal findings: Secondary | ICD-10-CM | POA: Diagnosis not present

## 2015-07-25 DIAGNOSIS — K7469 Other cirrhosis of liver: Secondary | ICD-10-CM | POA: Diagnosis not present

## 2015-07-25 DIAGNOSIS — E782 Mixed hyperlipidemia: Secondary | ICD-10-CM | POA: Diagnosis not present

## 2015-07-25 DIAGNOSIS — R51 Headache: Secondary | ICD-10-CM | POA: Diagnosis not present

## 2015-07-25 DIAGNOSIS — I10 Essential (primary) hypertension: Secondary | ICD-10-CM | POA: Diagnosis not present

## 2015-07-25 DIAGNOSIS — R32 Unspecified urinary incontinence: Secondary | ICD-10-CM | POA: Diagnosis not present

## 2015-07-25 DIAGNOSIS — K219 Gastro-esophageal reflux disease without esophagitis: Secondary | ICD-10-CM | POA: Diagnosis not present

## 2015-07-30 ENCOUNTER — Other Ambulatory Visit: Payer: Self-pay

## 2015-07-30 DIAGNOSIS — Z1231 Encounter for screening mammogram for malignant neoplasm of breast: Secondary | ICD-10-CM

## 2015-09-01 ENCOUNTER — Ambulatory Visit
Admission: RE | Admit: 2015-09-01 | Discharge: 2015-09-01 | Disposition: A | Discharge status: Home / Self Care | Payer: Medicare Other | Source: Ambulatory Visit

## 2015-09-01 DIAGNOSIS — Z1231 Encounter for screening mammogram for malignant neoplasm of breast: Secondary | ICD-10-CM

## 2015-10-29 DIAGNOSIS — D1801 Hemangioma of skin and subcutaneous tissue: Secondary | ICD-10-CM | POA: Diagnosis not present

## 2015-10-29 DIAGNOSIS — L821 Other seborrheic keratosis: Secondary | ICD-10-CM | POA: Diagnosis not present

## 2015-10-29 DIAGNOSIS — Z85828 Personal history of other malignant neoplasm of skin: Secondary | ICD-10-CM | POA: Diagnosis not present

## 2016-01-22 DIAGNOSIS — M8580 Other specified disorders of bone density and structure, unspecified site: Secondary | ICD-10-CM | POA: Diagnosis not present

## 2016-01-22 DIAGNOSIS — R2681 Unsteadiness on feet: Secondary | ICD-10-CM | POA: Diagnosis not present

## 2016-01-22 DIAGNOSIS — R32 Unspecified urinary incontinence: Secondary | ICD-10-CM | POA: Diagnosis not present

## 2016-01-22 DIAGNOSIS — R51 Headache: Secondary | ICD-10-CM | POA: Diagnosis not present

## 2016-01-22 DIAGNOSIS — K219 Gastro-esophageal reflux disease without esophagitis: Secondary | ICD-10-CM | POA: Diagnosis not present

## 2016-01-22 DIAGNOSIS — I10 Essential (primary) hypertension: Secondary | ICD-10-CM | POA: Diagnosis not present

## 2016-01-22 DIAGNOSIS — K7469 Other cirrhosis of liver: Secondary | ICD-10-CM | POA: Diagnosis not present

## 2016-01-22 DIAGNOSIS — E782 Mixed hyperlipidemia: Secondary | ICD-10-CM | POA: Diagnosis not present

## 2016-01-22 DIAGNOSIS — R7301 Impaired fasting glucose: Secondary | ICD-10-CM | POA: Diagnosis not present

## 2016-01-22 DIAGNOSIS — R682 Dry mouth, unspecified: Secondary | ICD-10-CM | POA: Diagnosis not present

## 2016-04-20 DIAGNOSIS — Z85828 Personal history of other malignant neoplasm of skin: Secondary | ICD-10-CM | POA: Diagnosis not present

## 2016-04-20 DIAGNOSIS — L820 Inflamed seborrheic keratosis: Secondary | ICD-10-CM | POA: Diagnosis not present

## 2016-04-20 DIAGNOSIS — L570 Actinic keratosis: Secondary | ICD-10-CM | POA: Diagnosis not present

## 2016-04-28 DIAGNOSIS — Z23 Encounter for immunization: Secondary | ICD-10-CM | POA: Diagnosis not present

## 2016-07-26 ENCOUNTER — Other Ambulatory Visit: Payer: Self-pay | Admitting: Internal Medicine

## 2016-07-26 DIAGNOSIS — Z1231 Encounter for screening mammogram for malignant neoplasm of breast: Secondary | ICD-10-CM

## 2016-08-19 DIAGNOSIS — H2513 Age-related nuclear cataract, bilateral: Secondary | ICD-10-CM | POA: Diagnosis not present

## 2016-08-19 DIAGNOSIS — H524 Presbyopia: Secondary | ICD-10-CM | POA: Diagnosis not present

## 2016-09-03 ENCOUNTER — Ambulatory Visit
Admission: RE | Admit: 2016-09-03 | Discharge: 2016-09-03 | Disposition: A | Discharge status: Home / Self Care | Payer: Medicare Other | Source: Ambulatory Visit | Attending: Internal Medicine | Admitting: Internal Medicine

## 2016-09-03 DIAGNOSIS — Z1231 Encounter for screening mammogram for malignant neoplasm of breast: Secondary | ICD-10-CM | POA: Diagnosis not present

## 2016-09-03 DIAGNOSIS — R32 Unspecified urinary incontinence: Secondary | ICD-10-CM | POA: Diagnosis not present

## 2016-09-03 DIAGNOSIS — R51 Headache: Secondary | ICD-10-CM | POA: Diagnosis not present

## 2016-09-03 DIAGNOSIS — M8580 Other specified disorders of bone density and structure, unspecified site: Secondary | ICD-10-CM | POA: Diagnosis not present

## 2016-09-03 DIAGNOSIS — Z0000 Encounter for general adult medical examination without abnormal findings: Secondary | ICD-10-CM | POA: Diagnosis not present

## 2016-09-03 DIAGNOSIS — R2681 Unsteadiness on feet: Secondary | ICD-10-CM | POA: Diagnosis not present

## 2016-09-03 DIAGNOSIS — Z79899 Other long term (current) drug therapy: Secondary | ICD-10-CM | POA: Diagnosis not present

## 2016-09-03 DIAGNOSIS — E669 Obesity, unspecified: Secondary | ICD-10-CM | POA: Diagnosis not present

## 2016-09-03 DIAGNOSIS — K7469 Other cirrhosis of liver: Secondary | ICD-10-CM | POA: Diagnosis not present

## 2016-09-03 DIAGNOSIS — K219 Gastro-esophageal reflux disease without esophagitis: Secondary | ICD-10-CM | POA: Diagnosis not present

## 2016-09-03 DIAGNOSIS — I10 Essential (primary) hypertension: Secondary | ICD-10-CM | POA: Diagnosis not present

## 2016-09-03 DIAGNOSIS — R682 Dry mouth, unspecified: Secondary | ICD-10-CM | POA: Diagnosis not present

## 2016-09-03 DIAGNOSIS — R7301 Impaired fasting glucose: Secondary | ICD-10-CM | POA: Diagnosis not present

## 2016-09-03 DIAGNOSIS — Z1389 Encounter for screening for other disorder: Secondary | ICD-10-CM | POA: Diagnosis not present

## 2016-09-03 DIAGNOSIS — E782 Mixed hyperlipidemia: Secondary | ICD-10-CM | POA: Diagnosis not present

## 2016-09-08 DIAGNOSIS — H2512 Age-related nuclear cataract, left eye: Secondary | ICD-10-CM | POA: Diagnosis not present

## 2016-09-08 DIAGNOSIS — H2511 Age-related nuclear cataract, right eye: Secondary | ICD-10-CM | POA: Diagnosis not present

## 2016-09-15 DIAGNOSIS — H2512 Age-related nuclear cataract, left eye: Secondary | ICD-10-CM | POA: Diagnosis not present

## 2016-10-28 DIAGNOSIS — Z85828 Personal history of other malignant neoplasm of skin: Secondary | ICD-10-CM | POA: Diagnosis not present

## 2016-10-28 DIAGNOSIS — D1801 Hemangioma of skin and subcutaneous tissue: Secondary | ICD-10-CM | POA: Diagnosis not present

## 2016-10-28 DIAGNOSIS — L570 Actinic keratosis: Secondary | ICD-10-CM | POA: Diagnosis not present

## 2016-10-28 DIAGNOSIS — L821 Other seborrheic keratosis: Secondary | ICD-10-CM | POA: Diagnosis not present

## 2017-01-31 DIAGNOSIS — Z961 Presence of intraocular lens: Secondary | ICD-10-CM | POA: Diagnosis not present

## 2017-06-15 DIAGNOSIS — Z23 Encounter for immunization: Secondary | ICD-10-CM | POA: Diagnosis not present

## 2017-06-15 DIAGNOSIS — I10 Essential (primary) hypertension: Secondary | ICD-10-CM | POA: Diagnosis not present

## 2017-06-15 DIAGNOSIS — E782 Mixed hyperlipidemia: Secondary | ICD-10-CM | POA: Diagnosis not present

## 2017-06-20 DIAGNOSIS — E782 Mixed hyperlipidemia: Secondary | ICD-10-CM | POA: Diagnosis not present

## 2017-06-20 DIAGNOSIS — I10 Essential (primary) hypertension: Secondary | ICD-10-CM | POA: Diagnosis not present

## 2017-07-21 DIAGNOSIS — G500 Trigeminal neuralgia: Secondary | ICD-10-CM | POA: Diagnosis not present

## 2017-07-29 DIAGNOSIS — G500 Trigeminal neuralgia: Secondary | ICD-10-CM | POA: Diagnosis not present

## 2017-08-19 ENCOUNTER — Other Ambulatory Visit: Payer: Self-pay | Admitting: Internal Medicine

## 2017-08-19 DIAGNOSIS — Z1231 Encounter for screening mammogram for malignant neoplasm of breast: Secondary | ICD-10-CM

## 2017-09-08 DIAGNOSIS — M8580 Other specified disorders of bone density and structure, unspecified site: Secondary | ICD-10-CM | POA: Diagnosis not present

## 2017-09-08 DIAGNOSIS — R7301 Impaired fasting glucose: Secondary | ICD-10-CM | POA: Diagnosis not present

## 2017-09-08 DIAGNOSIS — Z23 Encounter for immunization: Secondary | ICD-10-CM | POA: Diagnosis not present

## 2017-09-08 DIAGNOSIS — I10 Essential (primary) hypertension: Secondary | ICD-10-CM | POA: Diagnosis not present

## 2017-09-08 DIAGNOSIS — K7469 Other cirrhosis of liver: Secondary | ICD-10-CM | POA: Diagnosis not present

## 2017-09-08 DIAGNOSIS — J309 Allergic rhinitis, unspecified: Secondary | ICD-10-CM | POA: Diagnosis not present

## 2017-09-08 DIAGNOSIS — G500 Trigeminal neuralgia: Secondary | ICD-10-CM | POA: Diagnosis not present

## 2017-09-08 DIAGNOSIS — K210 Gastro-esophageal reflux disease with esophagitis: Secondary | ICD-10-CM | POA: Diagnosis not present

## 2017-09-08 DIAGNOSIS — K5900 Constipation, unspecified: Secondary | ICD-10-CM | POA: Diagnosis not present

## 2017-09-08 DIAGNOSIS — K7581 Nonalcoholic steatohepatitis (NASH): Secondary | ICD-10-CM | POA: Diagnosis not present

## 2017-09-08 DIAGNOSIS — E669 Obesity, unspecified: Secondary | ICD-10-CM | POA: Diagnosis not present

## 2017-09-08 DIAGNOSIS — E782 Mixed hyperlipidemia: Secondary | ICD-10-CM | POA: Diagnosis not present

## 2017-09-08 DIAGNOSIS — Z1389 Encounter for screening for other disorder: Secondary | ICD-10-CM | POA: Diagnosis not present

## 2017-09-08 DIAGNOSIS — Z0000 Encounter for general adult medical examination without abnormal findings: Secondary | ICD-10-CM | POA: Diagnosis not present

## 2017-09-19 ENCOUNTER — Ambulatory Visit
Admission: RE | Admit: 2017-09-19 | Discharge: 2017-09-19 | Disposition: A | Discharge status: Home / Self Care | Payer: Medicare Other | Source: Ambulatory Visit | Attending: Internal Medicine | Admitting: Internal Medicine

## 2017-09-19 DIAGNOSIS — Z1231 Encounter for screening mammogram for malignant neoplasm of breast: Secondary | ICD-10-CM

## 2017-10-26 DIAGNOSIS — Z85828 Personal history of other malignant neoplasm of skin: Secondary | ICD-10-CM | POA: Diagnosis not present

## 2017-10-26 DIAGNOSIS — L814 Other melanin hyperpigmentation: Secondary | ICD-10-CM | POA: Diagnosis not present

## 2017-10-26 DIAGNOSIS — D2372 Other benign neoplasm of skin of left lower limb, including hip: Secondary | ICD-10-CM | POA: Diagnosis not present

## 2017-10-26 DIAGNOSIS — L821 Other seborrheic keratosis: Secondary | ICD-10-CM | POA: Diagnosis not present

## 2017-10-26 DIAGNOSIS — D2239 Melanocytic nevi of other parts of face: Secondary | ICD-10-CM | POA: Diagnosis not present

## 2017-10-26 DIAGNOSIS — D1801 Hemangioma of skin and subcutaneous tissue: Secondary | ICD-10-CM | POA: Diagnosis not present

## 2017-10-26 DIAGNOSIS — L570 Actinic keratosis: Secondary | ICD-10-CM | POA: Diagnosis not present

## 2018-02-09 DIAGNOSIS — Z961 Presence of intraocular lens: Secondary | ICD-10-CM | POA: Diagnosis not present

## 2018-03-06 DIAGNOSIS — I10 Essential (primary) hypertension: Secondary | ICD-10-CM | POA: Diagnosis not present

## 2018-03-06 DIAGNOSIS — R7309 Other abnormal glucose: Secondary | ICD-10-CM | POA: Diagnosis not present

## 2018-03-06 DIAGNOSIS — E782 Mixed hyperlipidemia: Secondary | ICD-10-CM | POA: Diagnosis not present

## 2018-03-06 DIAGNOSIS — K210 Gastro-esophageal reflux disease with esophagitis: Secondary | ICD-10-CM | POA: Diagnosis not present

## 2018-07-05 ENCOUNTER — Other Ambulatory Visit: Payer: Self-pay | Admitting: Nurse Practitioner

## 2018-07-05 ENCOUNTER — Ambulatory Visit
Admission: RE | Admit: 2018-07-05 | Discharge: 2018-07-05 | Disposition: A | Discharge status: Home / Self Care | Payer: Medicare Other | Source: Ambulatory Visit | Attending: Nurse Practitioner | Admitting: Nurse Practitioner

## 2018-07-05 DIAGNOSIS — Z23 Encounter for immunization: Secondary | ICD-10-CM | POA: Diagnosis not present

## 2018-07-05 DIAGNOSIS — R0781 Pleurodynia: Secondary | ICD-10-CM

## 2018-07-05 DIAGNOSIS — W19XXXA Unspecified fall, initial encounter: Secondary | ICD-10-CM | POA: Diagnosis not present

## 2018-08-10 ENCOUNTER — Telehealth (INDEPENDENT_AMBULATORY_CARE_PROVIDER_SITE_OTHER): Payer: Self-pay

## 2018-08-10 NOTE — Telephone Encounter (Signed)
Triage call: the patient had surgery many years ago on her ankle by Dr. Marlou Sa and has ?2 screws in that ankle.  Has had no problems until last night, after being on her feet all day with Christmas festivities.  There was no swelling, warmth nor redness to the foot/ankle. The foot is feeling much better today. She scheduled an appointment with Dr. Marlou Sa for 08/23/18 to check the ankle/xrays and to discuss if she should have the hardware removed. She wanted some reassurance that she could wait that long without doing any lasting harm.  I offered her a sooner appointment with one of the other providers on Monday 08/14/18 to get some xrays to see if the hardware has shifted. At this time, she chose to  wait until her appointment with Dr. Marlou Sa.  I told her to let us know if she has any more pain, develops redness/warmth/swelling in the ankle/foot and we will see her sooner.

## 2018-08-23 ENCOUNTER — Ambulatory Visit (INDEPENDENT_AMBULATORY_CARE_PROVIDER_SITE_OTHER): Payer: Medicare Other

## 2018-08-23 ENCOUNTER — Encounter (INDEPENDENT_AMBULATORY_CARE_PROVIDER_SITE_OTHER): Payer: Self-pay | Admitting: Orthopedic Surgery

## 2018-08-23 ENCOUNTER — Ambulatory Visit (INDEPENDENT_AMBULATORY_CARE_PROVIDER_SITE_OTHER): Payer: Medicare Other | Admitting: Orthopedic Surgery

## 2018-08-23 DIAGNOSIS — M25571 Pain in right ankle and joints of right foot: Secondary | ICD-10-CM

## 2018-08-23 NOTE — Progress Notes (Signed)
Office Visit Note   Patient: Sheila Hanson           Date of Birth: 1934-07-24           MRN: [id number] Visit Date: 08/23/2018 Requested by: Josetta Huddle, MD [address] [place], [place] [address] PCP: Josetta Huddle, MD  Subjective: Chief Complaint  Patient presents with  . Right Ankle - Pain    HPI: Sheila Hanson is a patient with right ankle and leg pain.  She actually had only 1 day of pain on December 25.  The next day she was fine.  She has a remote history of right ankle fracture fixation.  Localizes the pain primarily to the medial aspect of the ankle.  Denies any history of trauma or injury or swelling or fevers              ROS: All systems reviewed are negative as they relate to the chief complaint within the history of present illness.  Patient denies  fevers or chills.   Assessment & Plan: Visit Diagnoses:  1. Pain in right ankle and joints of right foot     Plan: Impression is right ankle pain unclear etiology with no evidence of hardware failure or loosening or backing out.  No effusion in the ankle joint.  Range of motion is excellent.  Radiographs normal.  Plan at this time is observation.  Sheila Hanson states that this visit was primarily preventative.  She has a lot of responsibility at home with her husband.  For now I think we can watch this.  I will see her back as needed  Follow-Up Instructions: Return if symptoms worsen or fail to improve.   Orders:  Orders Placed This Encounter  Procedures  . XR Ankle Complete Right   No orders of the defined types were placed in this encounter.     Procedures: No procedures performed   Clinical Data: No additional findings.  Objective: Vital Signs: There were no vitals taken for this visit.  Physical Exam:   Constitutional: Patient appears well-developed HEENT:  Head: Normocephalic Eyes:EOM are normal Neck: Normal range of motion Cardiovascular: Normal rate Pulmonary/chest: Effort  normal Neurologic: Patient is alert Skin: Skin is warm Psychiatric: Patient has normal mood and affect  T Ortho Exam: Ortho exam demonstrates excellent range of motion of that right ankle with dorsiflexion to 20 plantarflexion to 30 inversion eversion strength intact.  Pedal pulses palpable.  No prominence or palpable hardware present.  Specialty Comments:  No specialty comments available.  Imaging: Xr Ankle Complete Right  Result Date: 08/23/2018 AP lateral mortise right ankle reviewed.  Bimalleolar ankle fracture hardware fixation good position alignment.  Mortise is symmetric.  No arthritis.  No complicating features.    PMFS History: There are no active problems to display for this patient.  Past Medical History:  Diagnosis Date  . Cirrhosis ([facility])   . Colon adenoma 2007  . Coronary artery disease   . GERD (gastroesophageal reflux disease)   . Humeral head fracture   . Hypercholesteremia   . Hypertension   . Osteopenia   . Positive PPD   . Varicose veins   . Wrist fracture     Family History  Problem Relation Age of Onset  . Cancer - Colon Mother   . Heart attack Father   . Breast cancer Sister 42  . Heart disease Brother   . Cancer - Other Brother   . Heart disease Sister  History reviewed. No pertinent surgical history. Social History   Occupational History  . Not on file  Tobacco Use  . Smoking status: Never Smoker  . Smokeless tobacco: Never Used  Substance and Sexual Activity  . Alcohol use: No  . Drug use: No  . Sexual activity: Not on file

## 2018-09-27 ENCOUNTER — Other Ambulatory Visit: Payer: Self-pay | Admitting: Internal Medicine

## 2018-09-27 DIAGNOSIS — Z79899 Other long term (current) drug therapy: Secondary | ICD-10-CM | POA: Diagnosis not present

## 2018-09-27 DIAGNOSIS — K921 Melena: Secondary | ICD-10-CM | POA: Diagnosis not present

## 2018-09-27 DIAGNOSIS — K648 Other hemorrhoids: Secondary | ICD-10-CM | POA: Diagnosis not present

## 2018-09-27 DIAGNOSIS — E782 Mixed hyperlipidemia: Secondary | ICD-10-CM | POA: Diagnosis not present

## 2018-09-27 DIAGNOSIS — Z1389 Encounter for screening for other disorder: Secondary | ICD-10-CM | POA: Diagnosis not present

## 2018-09-27 DIAGNOSIS — I10 Essential (primary) hypertension: Secondary | ICD-10-CM | POA: Diagnosis not present

## 2018-09-27 DIAGNOSIS — M810 Age-related osteoporosis without current pathological fracture: Secondary | ICD-10-CM

## 2018-09-27 DIAGNOSIS — K7581 Nonalcoholic steatohepatitis (NASH): Secondary | ICD-10-CM | POA: Diagnosis not present

## 2018-09-27 DIAGNOSIS — Z13820 Encounter for screening for osteoporosis: Secondary | ICD-10-CM | POA: Diagnosis not present

## 2018-09-27 DIAGNOSIS — R7301 Impaired fasting glucose: Secondary | ICD-10-CM | POA: Diagnosis not present

## 2018-09-27 DIAGNOSIS — M8580 Other specified disorders of bone density and structure, unspecified site: Secondary | ICD-10-CM | POA: Diagnosis not present

## 2018-09-27 DIAGNOSIS — K7469 Other cirrhosis of liver: Secondary | ICD-10-CM | POA: Diagnosis not present

## 2018-09-27 DIAGNOSIS — Z0000 Encounter for general adult medical examination without abnormal findings: Secondary | ICD-10-CM | POA: Diagnosis not present

## 2018-10-05 ENCOUNTER — Other Ambulatory Visit: Payer: Self-pay | Admitting: Internal Medicine

## 2018-10-05 DIAGNOSIS — Z1231 Encounter for screening mammogram for malignant neoplasm of breast: Secondary | ICD-10-CM

## 2018-11-22 ENCOUNTER — Ambulatory Visit: Payer: Medicare Other

## 2018-11-22 ENCOUNTER — Other Ambulatory Visit: Payer: Medicare Other

## 2019-01-16 DIAGNOSIS — D1801 Hemangioma of skin and subcutaneous tissue: Secondary | ICD-10-CM | POA: Diagnosis not present

## 2019-01-16 DIAGNOSIS — Z85828 Personal history of other malignant neoplasm of skin: Secondary | ICD-10-CM | POA: Diagnosis not present

## 2019-01-16 DIAGNOSIS — L570 Actinic keratosis: Secondary | ICD-10-CM | POA: Diagnosis not present

## 2019-01-16 DIAGNOSIS — L821 Other seborrheic keratosis: Secondary | ICD-10-CM | POA: Diagnosis not present

## 2019-01-24 ENCOUNTER — Other Ambulatory Visit: Payer: Self-pay

## 2019-01-24 ENCOUNTER — Ambulatory Visit
Admission: RE | Admit: 2019-01-24 | Discharge: 2019-01-24 | Disposition: A | Discharge status: Home / Self Care | Payer: Medicare Other | Source: Ambulatory Visit | Attending: Internal Medicine | Admitting: Internal Medicine

## 2019-01-24 DIAGNOSIS — Z1231 Encounter for screening mammogram for malignant neoplasm of breast: Secondary | ICD-10-CM | POA: Diagnosis not present

## 2019-01-24 DIAGNOSIS — M810 Age-related osteoporosis without current pathological fracture: Secondary | ICD-10-CM

## 2019-01-24 DIAGNOSIS — Z780 Asymptomatic menopausal state: Secondary | ICD-10-CM | POA: Diagnosis not present

## 2019-01-24 DIAGNOSIS — M8589 Other specified disorders of bone density and structure, multiple sites: Secondary | ICD-10-CM | POA: Diagnosis not present

## 2019-01-24 IMAGING — MG DIGITAL SCREENING BILATERAL MAMMOGRAM WITH TOMO AND CAD
6 of 12 series · 6 of 36 positions shown · non-contrast
Comparison: Previous exam(s).

CLINICAL DATA: Screening.

EXAM:
DIGITAL SCREENING BILATERAL MAMMOGRAM WITH TOMO AND CAD

[L CC synth-2D]
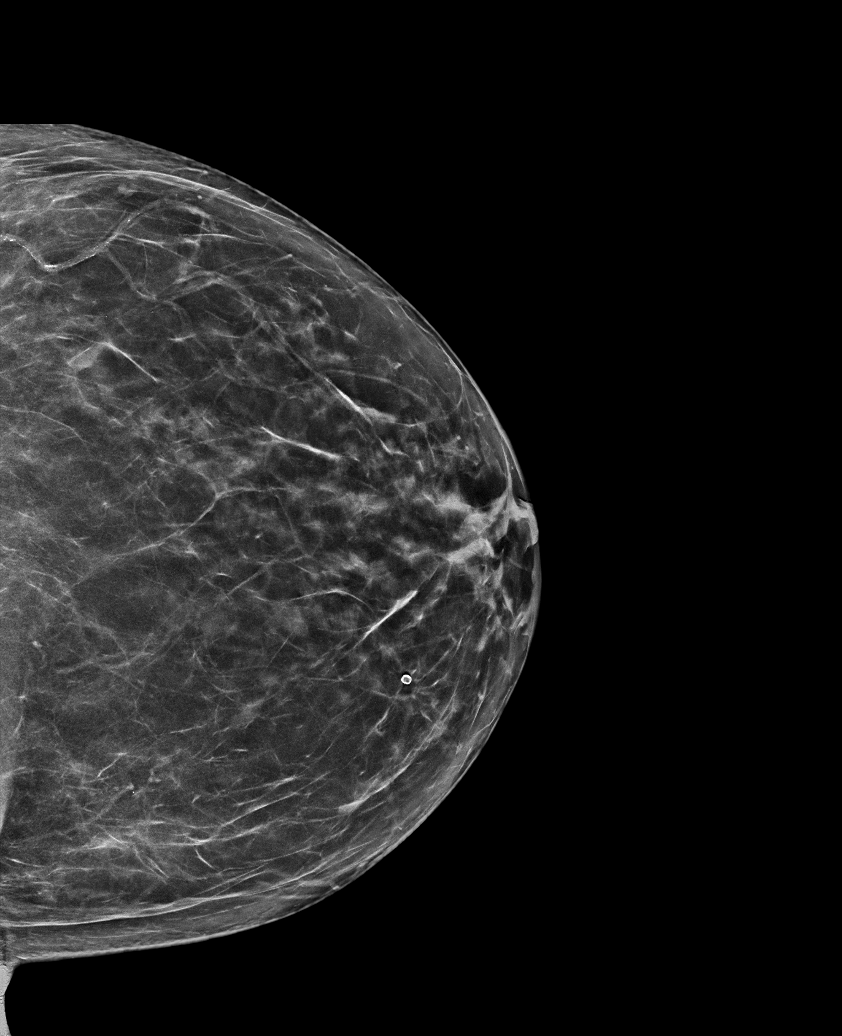

[R CC synth-2D (1 of 2)]
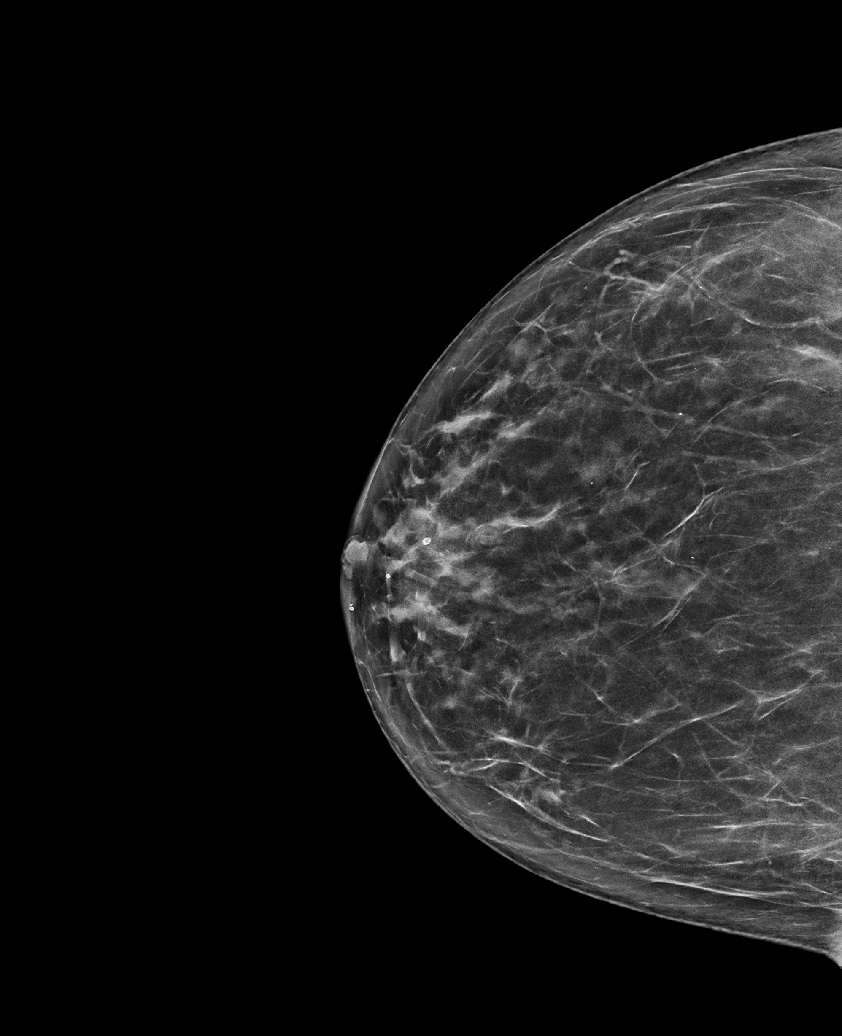

[R MLO synth-2D (1 of 2)]
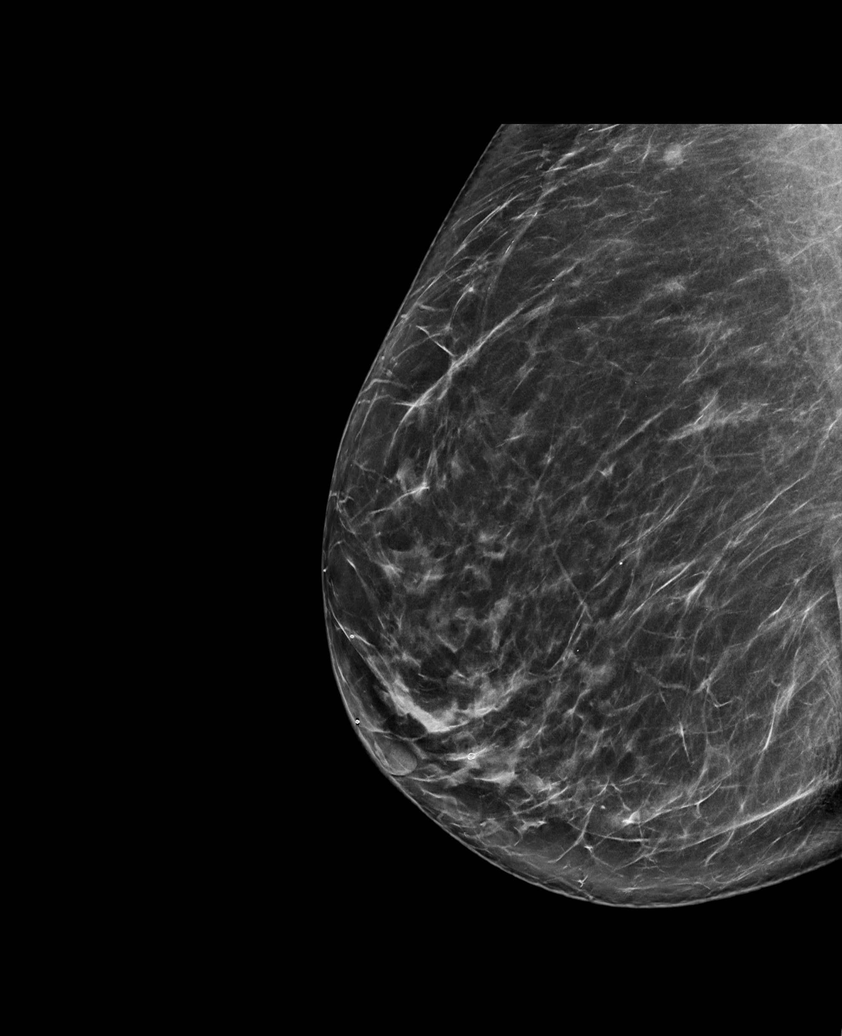

[R CC synth-2D (2 of 2)]
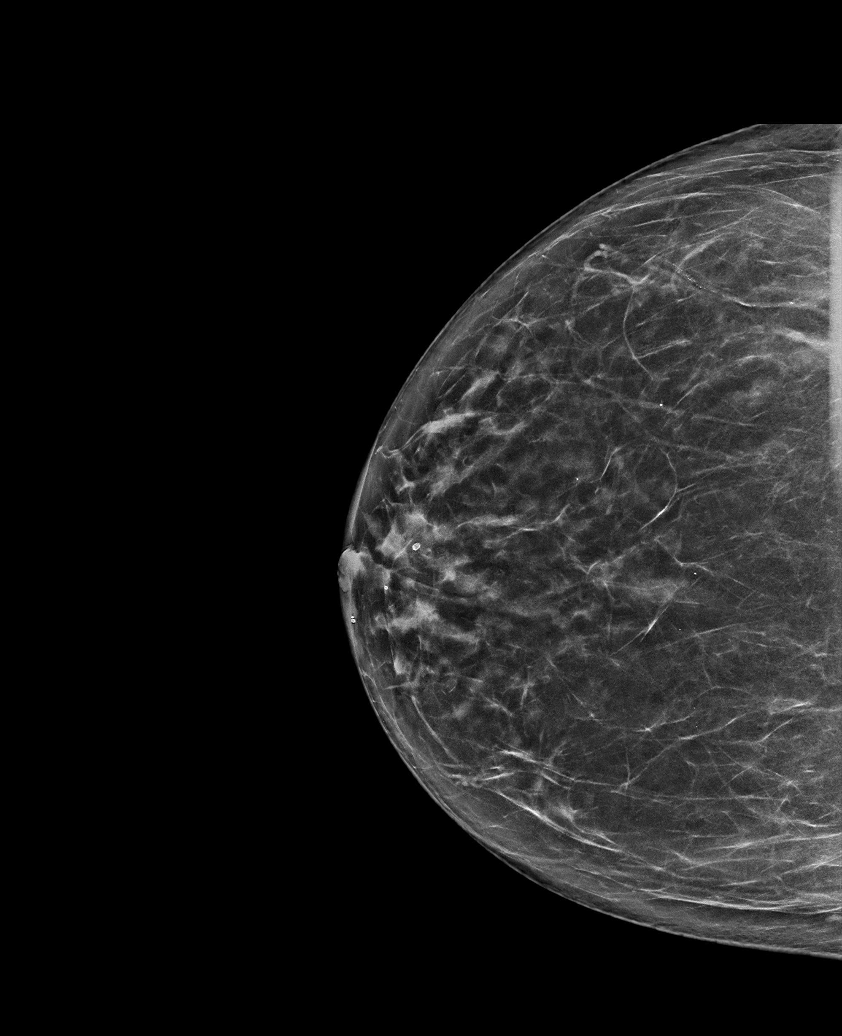

[R MLO synth-2D (2 of 2)]
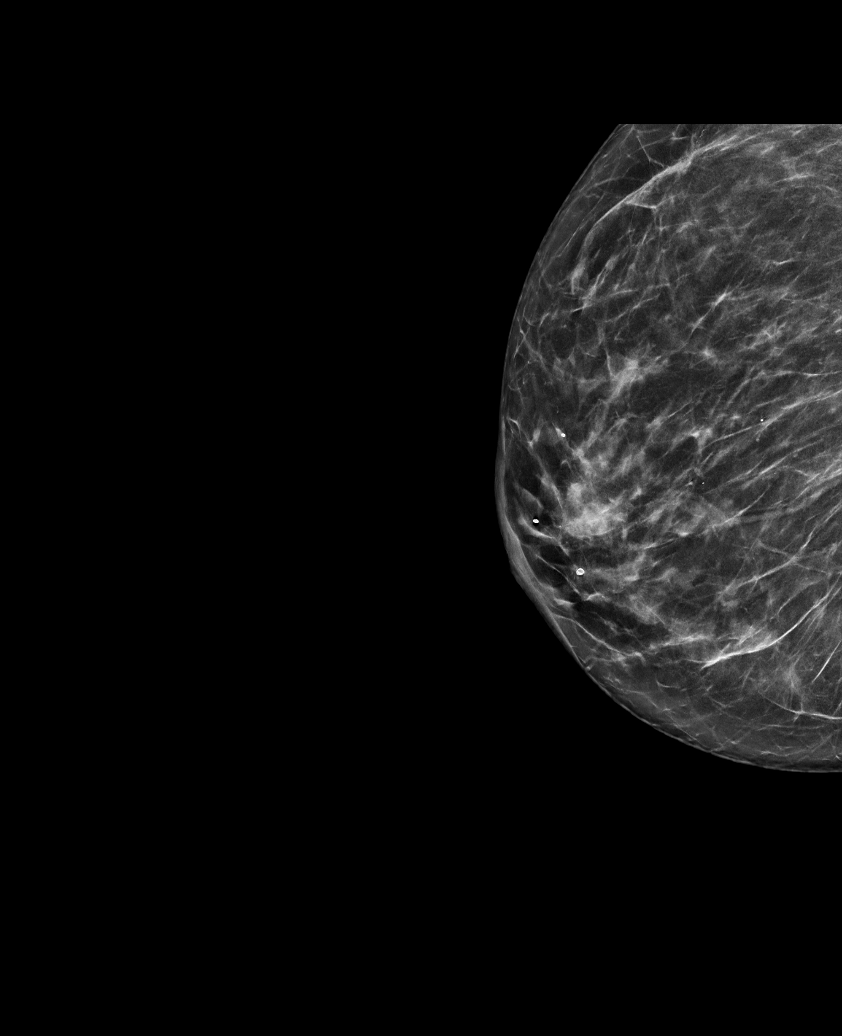

[L MLO synth-2D]
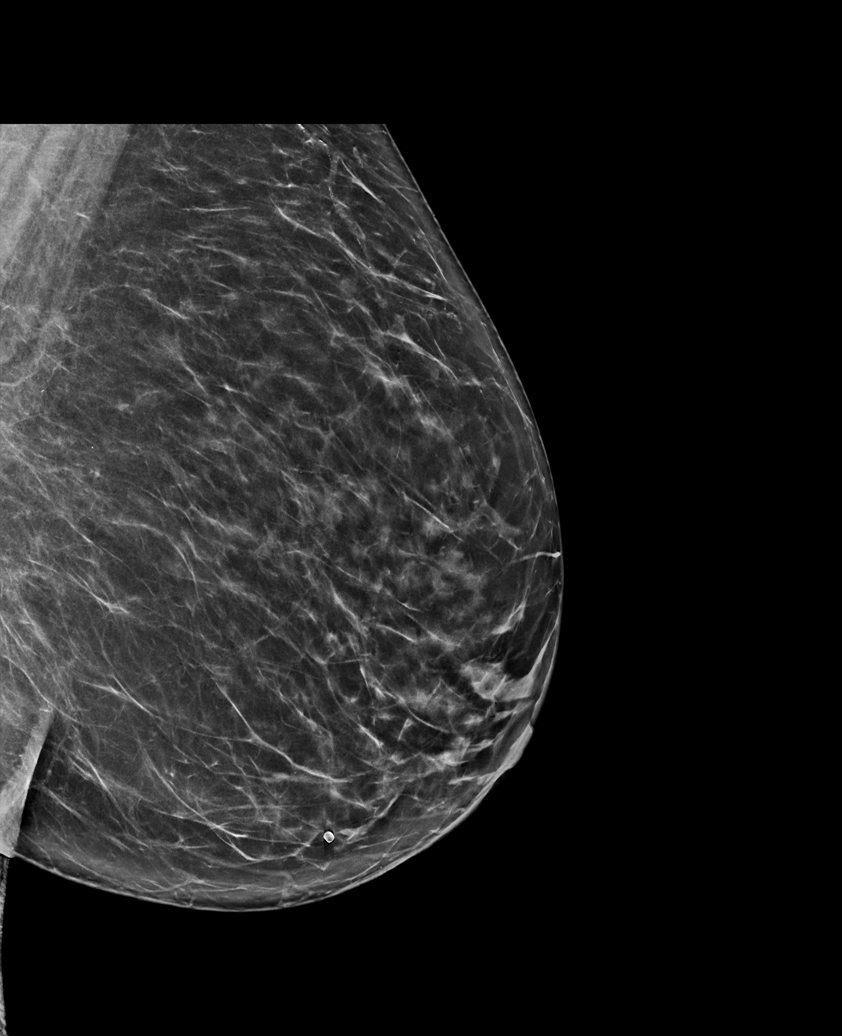

[6 of 36 positions shown; findings below may reference images not displayed]

ACR Breast Density Category b: There are scattered areas of
fibroglandular density.
FINDINGS: There are no findings suspicious for malignancy. Images were
processed with CAD.
IMPRESSION: No mammographic evidence of malignancy. A result letter of this
screening mammogram will be mailed directly to the patient.

RECOMMENDATION:
Screening mammogram in one year. (Code:[TQ])

BI-RADS CATEGORY  1: Negative.

## 2019-03-19 DIAGNOSIS — N390 Urinary tract infection, site not specified: Secondary | ICD-10-CM | POA: Diagnosis not present

## 2019-03-23 DIAGNOSIS — M25551 Pain in right hip: Secondary | ICD-10-CM | POA: Diagnosis not present

## 2019-03-23 DIAGNOSIS — Z96641 Presence of right artificial hip joint: Secondary | ICD-10-CM | POA: Diagnosis not present

## 2019-06-01 DIAGNOSIS — Z961 Presence of intraocular lens: Secondary | ICD-10-CM | POA: Diagnosis not present

## 2019-06-01 DIAGNOSIS — H5213 Myopia, bilateral: Secondary | ICD-10-CM | POA: Diagnosis not present

## 2019-06-01 DIAGNOSIS — H524 Presbyopia: Secondary | ICD-10-CM | POA: Diagnosis not present

## 2019-06-06 DIAGNOSIS — Z23 Encounter for immunization: Secondary | ICD-10-CM | POA: Diagnosis not present

## 2019-06-27 DIAGNOSIS — E782 Mixed hyperlipidemia: Secondary | ICD-10-CM | POA: Diagnosis not present

## 2019-06-27 DIAGNOSIS — M8580 Other specified disorders of bone density and structure, unspecified site: Secondary | ICD-10-CM | POA: Diagnosis not present

## 2019-06-27 DIAGNOSIS — I10 Essential (primary) hypertension: Secondary | ICD-10-CM | POA: Diagnosis not present

## 2019-09-11 DIAGNOSIS — E782 Mixed hyperlipidemia: Secondary | ICD-10-CM | POA: Diagnosis not present

## 2019-09-11 DIAGNOSIS — I10 Essential (primary) hypertension: Secondary | ICD-10-CM | POA: Diagnosis not present

## 2019-09-11 DIAGNOSIS — M8580 Other specified disorders of bone density and structure, unspecified site: Secondary | ICD-10-CM | POA: Diagnosis not present

## 2019-10-11 DIAGNOSIS — Z79899 Other long term (current) drug therapy: Secondary | ICD-10-CM | POA: Diagnosis not present

## 2019-10-11 DIAGNOSIS — G500 Trigeminal neuralgia: Secondary | ICD-10-CM | POA: Diagnosis not present

## 2019-10-11 DIAGNOSIS — J309 Allergic rhinitis, unspecified: Secondary | ICD-10-CM | POA: Diagnosis not present

## 2019-10-11 DIAGNOSIS — K219 Gastro-esophageal reflux disease without esophagitis: Secondary | ICD-10-CM | POA: Diagnosis not present

## 2019-10-11 DIAGNOSIS — K7581 Nonalcoholic steatohepatitis (NASH): Secondary | ICD-10-CM | POA: Diagnosis not present

## 2019-10-11 DIAGNOSIS — Z0000 Encounter for general adult medical examination without abnormal findings: Secondary | ICD-10-CM | POA: Diagnosis not present

## 2019-10-11 DIAGNOSIS — M8580 Other specified disorders of bone density and structure, unspecified site: Secondary | ICD-10-CM | POA: Diagnosis not present

## 2019-10-11 DIAGNOSIS — E782 Mixed hyperlipidemia: Secondary | ICD-10-CM | POA: Diagnosis not present

## 2019-10-11 DIAGNOSIS — R7309 Other abnormal glucose: Secondary | ICD-10-CM | POA: Diagnosis not present

## 2019-10-11 DIAGNOSIS — I10 Essential (primary) hypertension: Secondary | ICD-10-CM | POA: Diagnosis not present

## 2019-10-11 DIAGNOSIS — Z1389 Encounter for screening for other disorder: Secondary | ICD-10-CM | POA: Diagnosis not present

## 2019-10-14 DIAGNOSIS — I10 Essential (primary) hypertension: Secondary | ICD-10-CM | POA: Diagnosis not present

## 2019-10-14 DIAGNOSIS — M8580 Other specified disorders of bone density and structure, unspecified site: Secondary | ICD-10-CM | POA: Diagnosis not present

## 2019-10-14 DIAGNOSIS — E782 Mixed hyperlipidemia: Secondary | ICD-10-CM | POA: Diagnosis not present

## 2019-11-14 DIAGNOSIS — E782 Mixed hyperlipidemia: Secondary | ICD-10-CM | POA: Diagnosis not present

## 2019-11-14 DIAGNOSIS — I10 Essential (primary) hypertension: Secondary | ICD-10-CM | POA: Diagnosis not present

## 2019-11-14 DIAGNOSIS — M8580 Other specified disorders of bone density and structure, unspecified site: Secondary | ICD-10-CM | POA: Diagnosis not present

## 2019-12-14 DIAGNOSIS — E782 Mixed hyperlipidemia: Secondary | ICD-10-CM | POA: Diagnosis not present

## 2019-12-14 DIAGNOSIS — M8580 Other specified disorders of bone density and structure, unspecified site: Secondary | ICD-10-CM | POA: Diagnosis not present

## 2019-12-14 DIAGNOSIS — I10 Essential (primary) hypertension: Secondary | ICD-10-CM | POA: Diagnosis not present

## 2019-12-21 ENCOUNTER — Other Ambulatory Visit: Payer: Self-pay | Admitting: Pediatric Surgery

## 2019-12-21 ENCOUNTER — Other Ambulatory Visit: Payer: Self-pay | Admitting: Internal Medicine

## 2019-12-21 DIAGNOSIS — R109 Unspecified abdominal pain: Secondary | ICD-10-CM

## 2019-12-21 DIAGNOSIS — N1330 Unspecified hydronephrosis: Secondary | ICD-10-CM

## 2019-12-27 ENCOUNTER — Ambulatory Visit
Admission: RE | Admit: 2019-12-27 | Discharge: 2019-12-27 | Disposition: A | Discharge status: Home / Self Care | Payer: Medicare Other | Source: Ambulatory Visit | Attending: Internal Medicine | Admitting: Internal Medicine

## 2019-12-27 DIAGNOSIS — K8020 Calculus of gallbladder without cholecystitis without obstruction: Secondary | ICD-10-CM | POA: Diagnosis not present

## 2019-12-27 DIAGNOSIS — N1330 Unspecified hydronephrosis: Secondary | ICD-10-CM

## 2019-12-27 DIAGNOSIS — R109 Unspecified abdominal pain: Secondary | ICD-10-CM

## 2019-12-27 IMAGING — US US ABDOMEN COMPLETE
1 series · 14 of 25 positions shown · non-contrast
Comparison: [DATE]

CLINICAL DATA: Left flank pain for 3 weeks

EXAM:
ABDOMEN ULTRASOUND COMPLETE

[Series 1: us abdomen complete · 0.23mm/px · 14 of 76 slices shown]
[im 1/76]
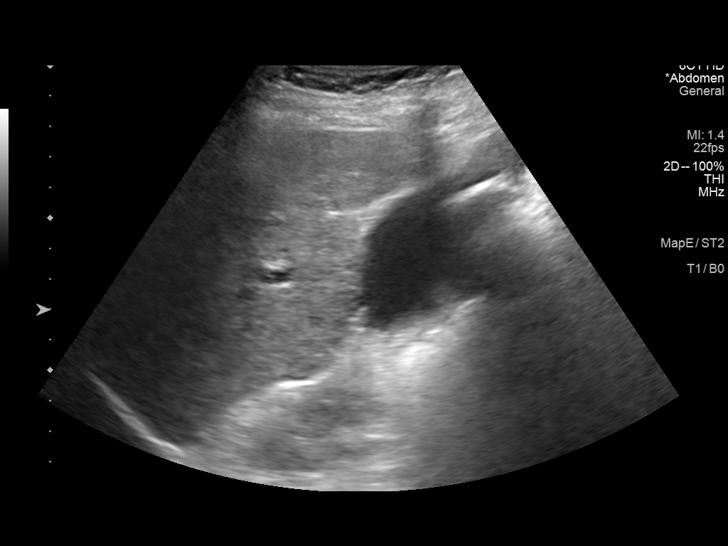
[im 7/76]
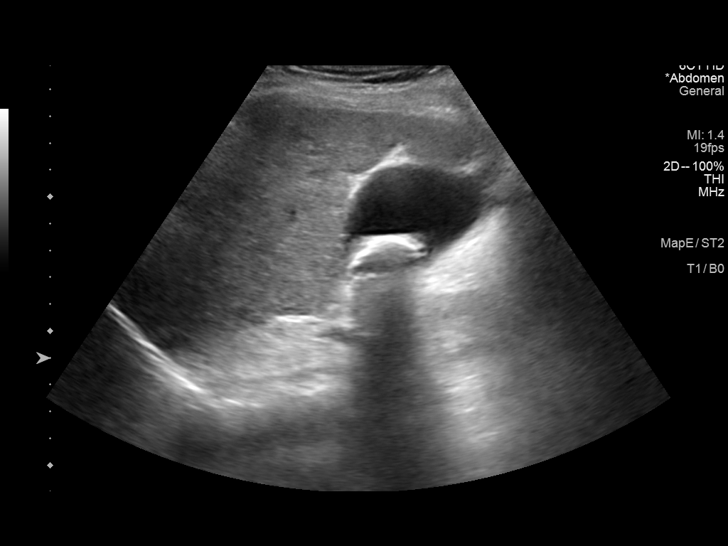
[im 13/76]
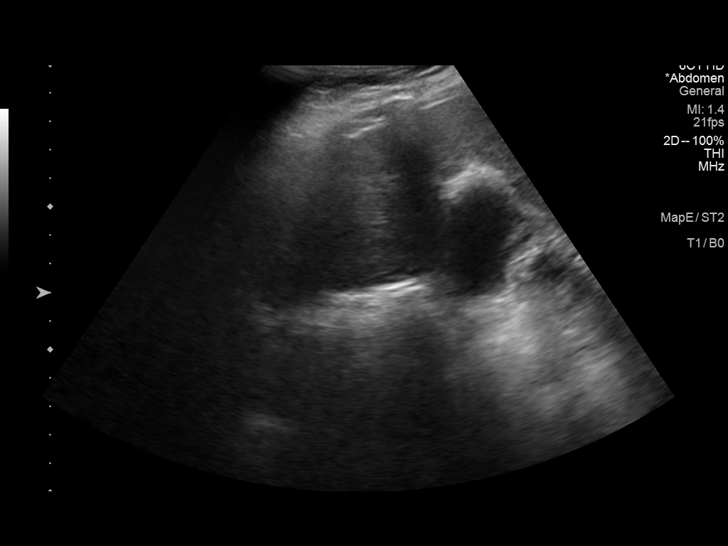
[im 19/76]
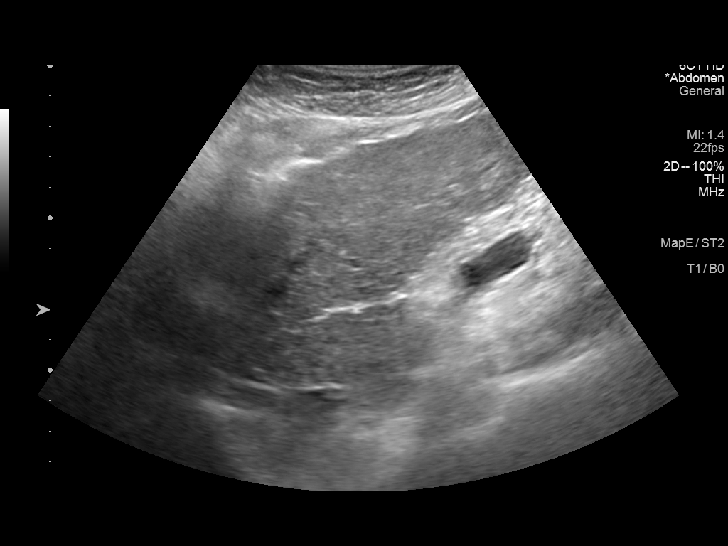
[im 26/76]
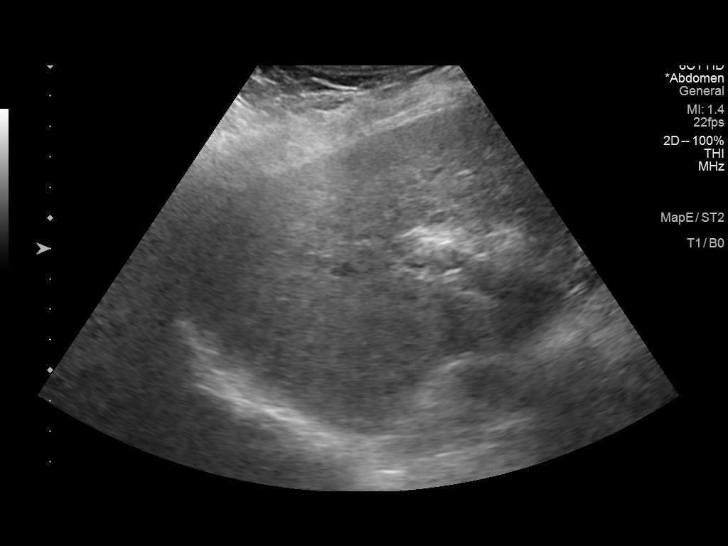
[im 29/76]
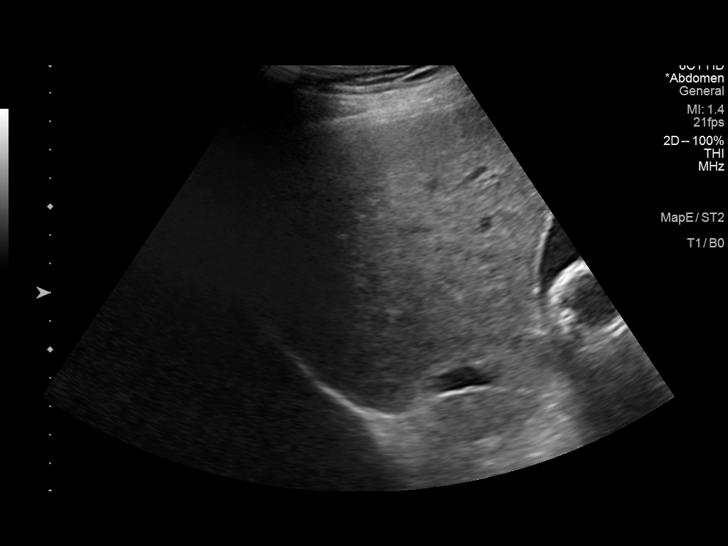
[im 35/76]
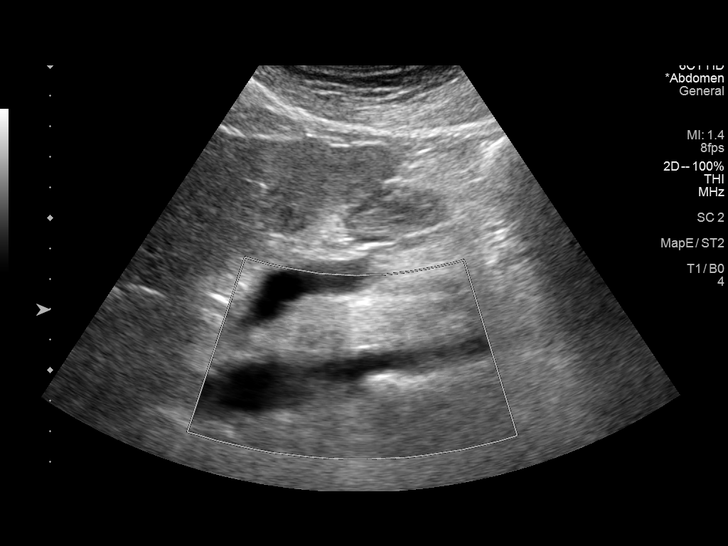
[im 41/76]
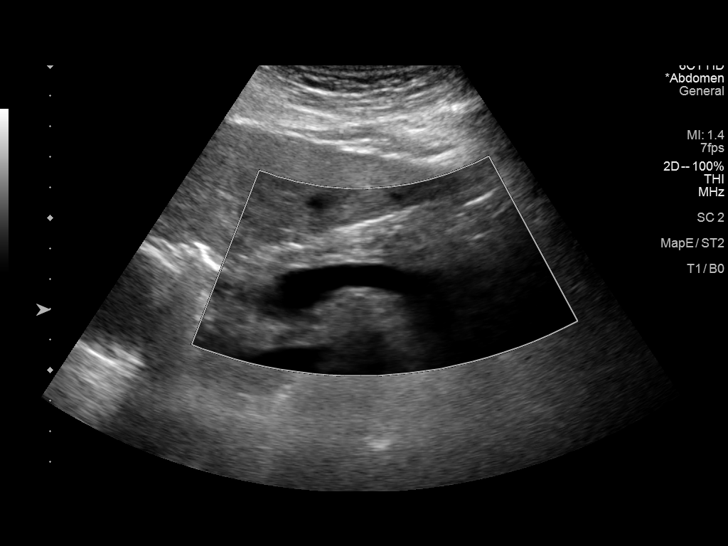
[im 47/76]
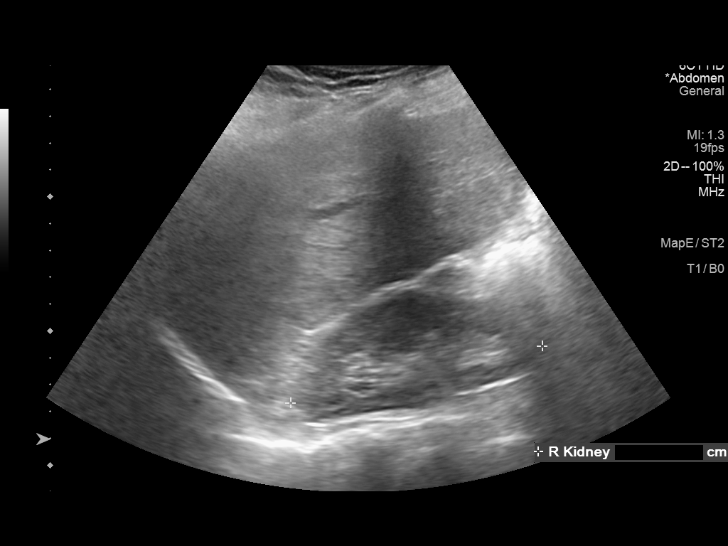
[im 51/76]
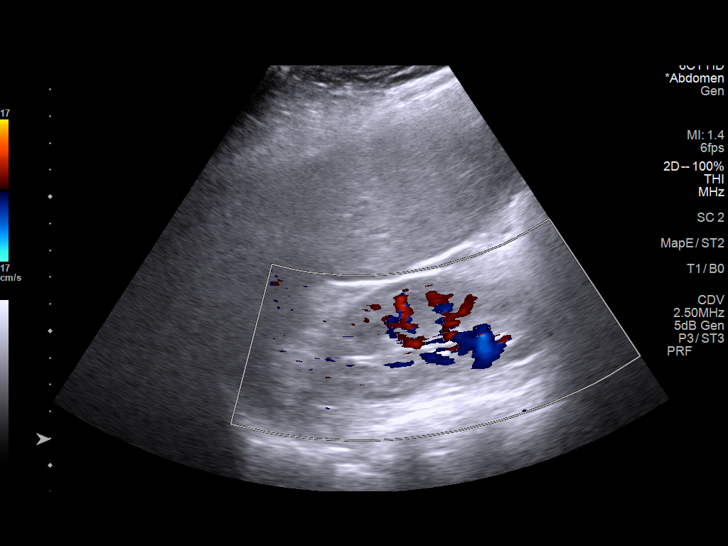
[im 57/76]
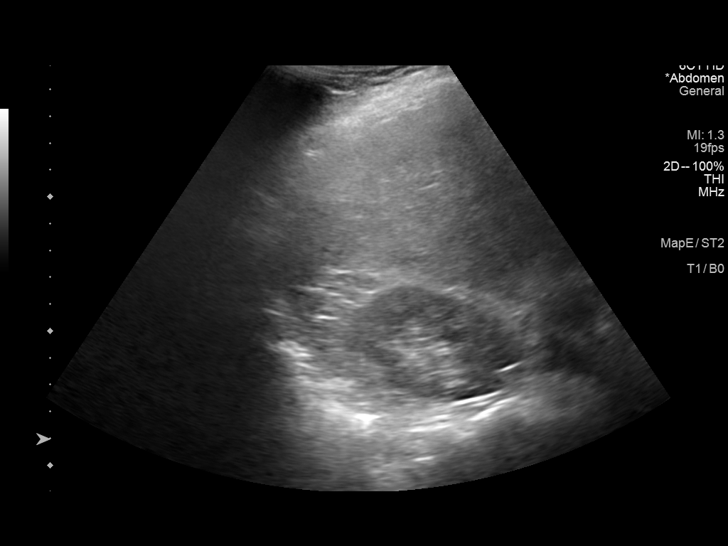
[im 63/76]
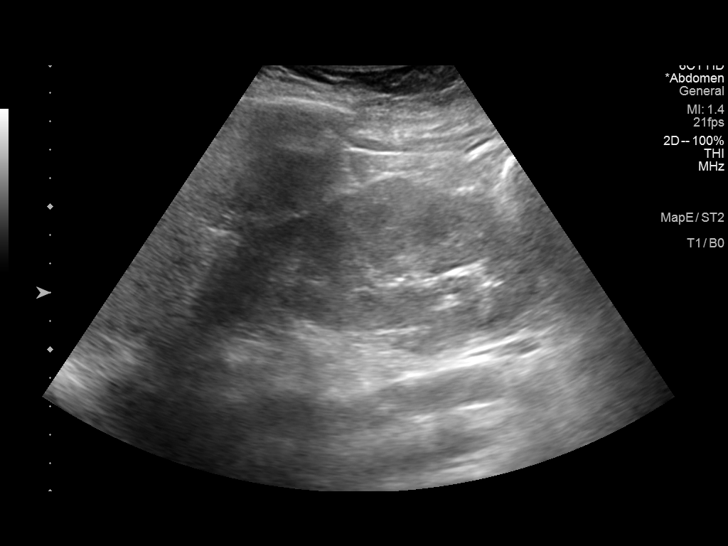
[im 69/76]
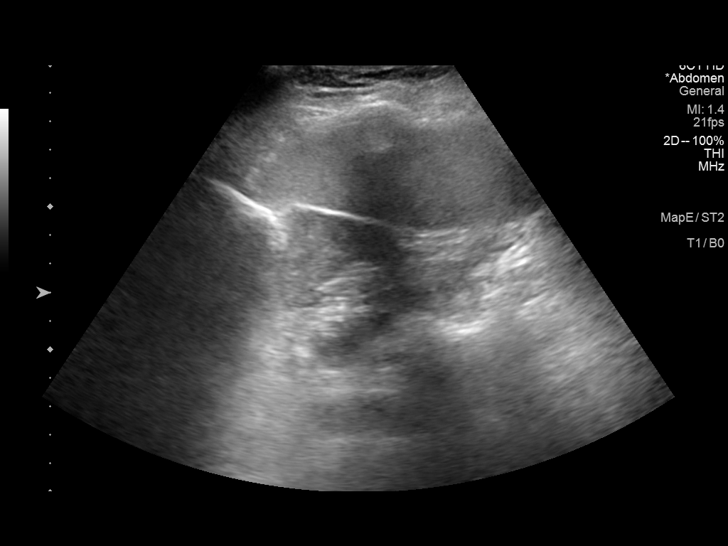
[im 76/76]
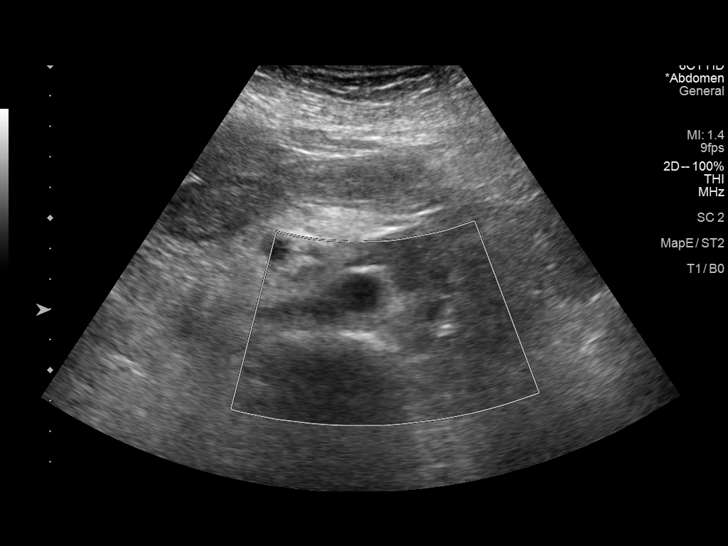

[14 of 25 positions shown; findings below may reference images not displayed]

FINDINGS: Gallbladder: Multiple shadowing gallstones are seen within the
gallbladder, largest measuring 2.9 cm. No gallbladder wall
thickening or pericholecystic fluid.

Common bile duct: Diameter: 4 mm

Liver: Liver demonstrates heterogeneous echotexture with slight
nodularity of the liver capsule, which may reflect cirrhosis. No
focal liver abnormality. No intrahepatic biliary duct dilation.
Portal vein is patent on color Doppler imaging with normal direction
of blood flow towards the liver.

IVC: No abnormality visualized.

Pancreas: Visualized portion unremarkable.

Spleen: Size and appearance within normal limits.

Right Kidney: Length: 9.6 cm. Echogenicity within normal limits. No
mass or hydronephrosis visualized.

Left Kidney: Length: 12.3 cm. Echogenicity within normal limits. No
mass or hydronephrosis visualized.

Abdominal aorta: No aneurysm visualized.

Other findings: None.
IMPRESSION: 1. Cholelithiasis without cholecystitis.
2. Heterogeneous liver echotexture and capsular nodularity,
compatible with cirrhosis.
3. Otherwise unremarkable exam.

## 2019-12-28 DIAGNOSIS — M79602 Pain in left arm: Secondary | ICD-10-CM | POA: Diagnosis not present

## 2019-12-28 DIAGNOSIS — R109 Unspecified abdominal pain: Secondary | ICD-10-CM | POA: Diagnosis not present

## 2020-01-01 DIAGNOSIS — R109 Unspecified abdominal pain: Secondary | ICD-10-CM | POA: Diagnosis not present

## 2020-01-01 DIAGNOSIS — M79602 Pain in left arm: Secondary | ICD-10-CM | POA: Diagnosis not present

## 2020-02-13 DIAGNOSIS — I10 Essential (primary) hypertension: Secondary | ICD-10-CM | POA: Diagnosis not present

## 2020-02-13 DIAGNOSIS — E782 Mixed hyperlipidemia: Secondary | ICD-10-CM | POA: Diagnosis not present

## 2020-02-13 DIAGNOSIS — M8580 Other specified disorders of bone density and structure, unspecified site: Secondary | ICD-10-CM | POA: Diagnosis not present

## 2020-03-10 DIAGNOSIS — E782 Mixed hyperlipidemia: Secondary | ICD-10-CM | POA: Diagnosis not present

## 2020-03-10 DIAGNOSIS — I10 Essential (primary) hypertension: Secondary | ICD-10-CM | POA: Diagnosis not present

## 2020-03-10 DIAGNOSIS — M8580 Other specified disorders of bone density and structure, unspecified site: Secondary | ICD-10-CM | POA: Diagnosis not present

## 2020-04-02 DIAGNOSIS — E782 Mixed hyperlipidemia: Secondary | ICD-10-CM | POA: Diagnosis not present

## 2020-04-02 DIAGNOSIS — M8580 Other specified disorders of bone density and structure, unspecified site: Secondary | ICD-10-CM | POA: Diagnosis not present

## 2020-04-02 DIAGNOSIS — I10 Essential (primary) hypertension: Secondary | ICD-10-CM | POA: Diagnosis not present

## 2020-04-09 DIAGNOSIS — Z23 Encounter for immunization: Secondary | ICD-10-CM | POA: Diagnosis not present

## 2020-05-05 DIAGNOSIS — M8580 Other specified disorders of bone density and structure, unspecified site: Secondary | ICD-10-CM | POA: Diagnosis not present

## 2020-05-05 DIAGNOSIS — E782 Mixed hyperlipidemia: Secondary | ICD-10-CM | POA: Diagnosis not present

## 2020-05-05 DIAGNOSIS — I10 Essential (primary) hypertension: Secondary | ICD-10-CM | POA: Diagnosis not present

## 2020-05-23 DIAGNOSIS — E782 Mixed hyperlipidemia: Secondary | ICD-10-CM | POA: Diagnosis not present

## 2020-05-23 DIAGNOSIS — I10 Essential (primary) hypertension: Secondary | ICD-10-CM | POA: Diagnosis not present

## 2020-05-23 DIAGNOSIS — M8580 Other specified disorders of bone density and structure, unspecified site: Secondary | ICD-10-CM | POA: Diagnosis not present

## 2020-06-13 ENCOUNTER — Other Ambulatory Visit: Payer: Self-pay | Admitting: Internal Medicine

## 2020-06-13 DIAGNOSIS — Z1231 Encounter for screening mammogram for malignant neoplasm of breast: Secondary | ICD-10-CM

## 2020-06-16 ENCOUNTER — Ambulatory Visit
Admission: RE | Admit: 2020-06-16 | Discharge: 2020-06-16 | Disposition: A | Discharge status: Home / Self Care | Payer: Medicare Other | Source: Ambulatory Visit | Attending: Internal Medicine | Admitting: Internal Medicine

## 2020-06-16 ENCOUNTER — Other Ambulatory Visit: Payer: Self-pay

## 2020-06-16 DIAGNOSIS — Z1231 Encounter for screening mammogram for malignant neoplasm of breast: Secondary | ICD-10-CM

## 2020-06-16 DIAGNOSIS — H04123 Dry eye syndrome of bilateral lacrimal glands: Secondary | ICD-10-CM | POA: Diagnosis not present

## 2020-06-16 DIAGNOSIS — Z961 Presence of intraocular lens: Secondary | ICD-10-CM | POA: Diagnosis not present

## 2020-06-16 IMAGING — MG DIGITAL SCREENING BILAT W/ TOMO W/ CAD
8 series · 8 of 24 positions shown · non-contrast
Comparison: Previous exam(s).

CLINICAL DATA: Screening.

EXAM:
DIGITAL SCREENING BILATERAL MAMMOGRAM WITH TOMO AND CAD

[R CC synth-2D]
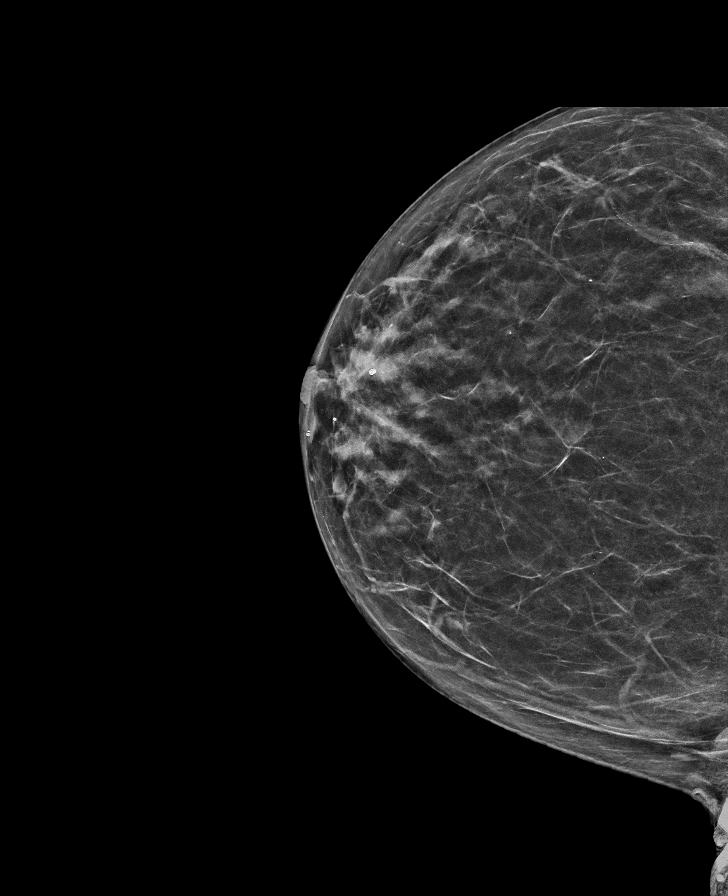

[R MLO synth-2D]
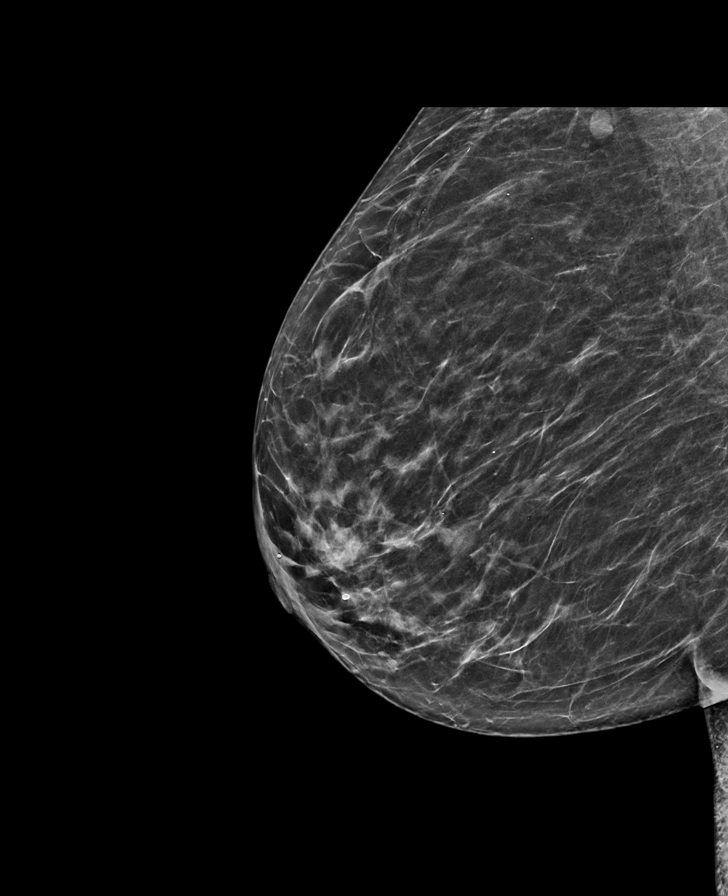

[L MLO synth-2D]
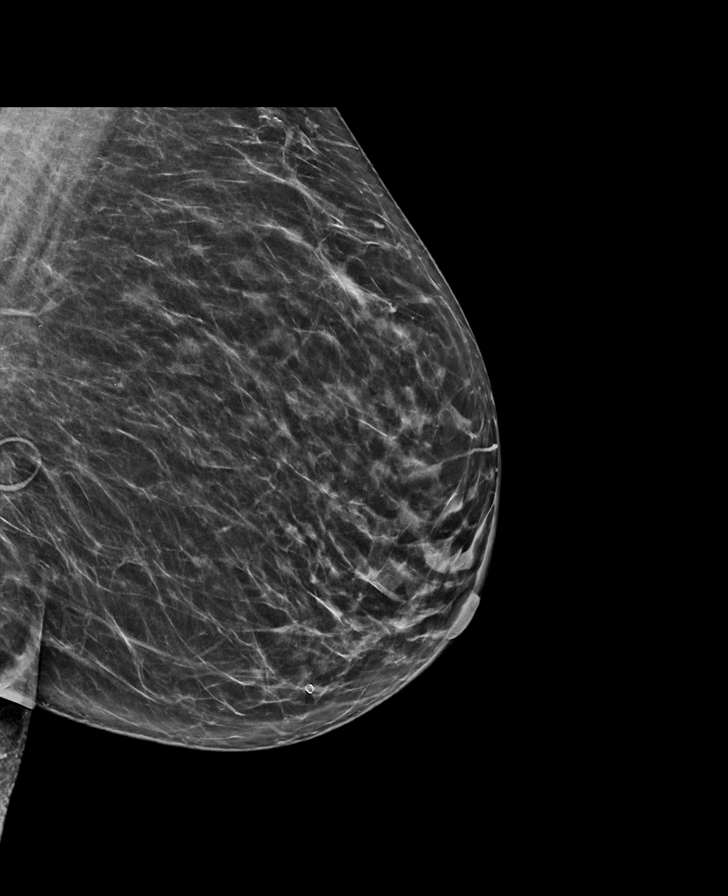

[L CC synth-2D]
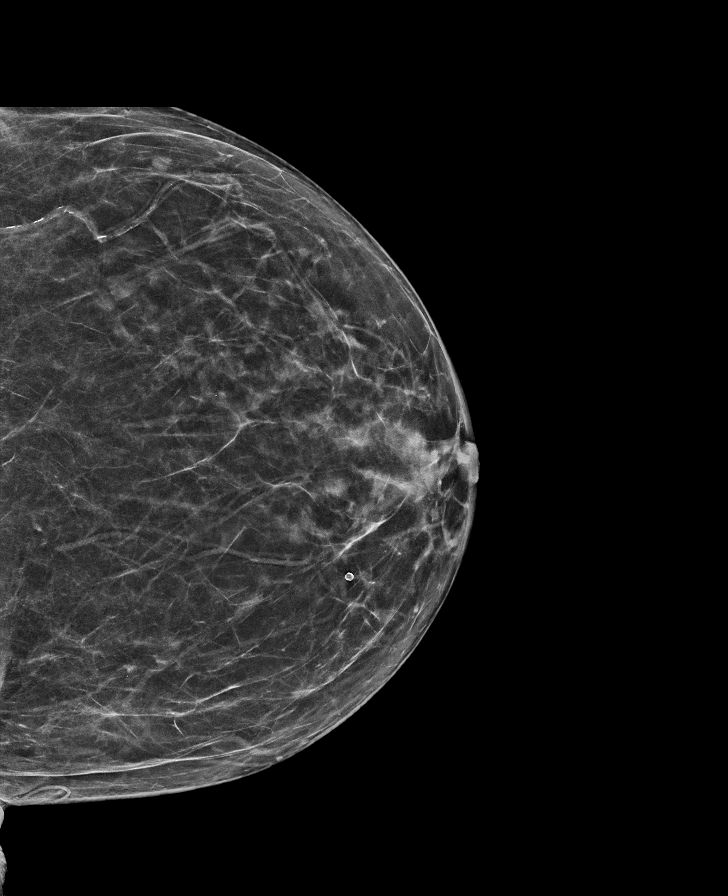

[R CC tomo · tomo slice 32/63.0]
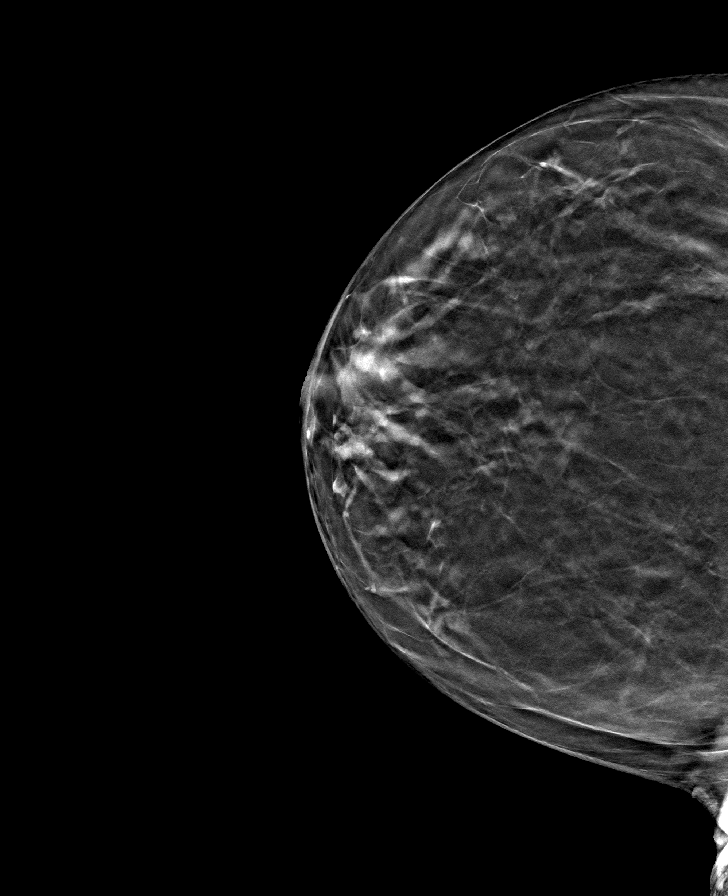

[L CC tomo · tomo slice 35/68.0]
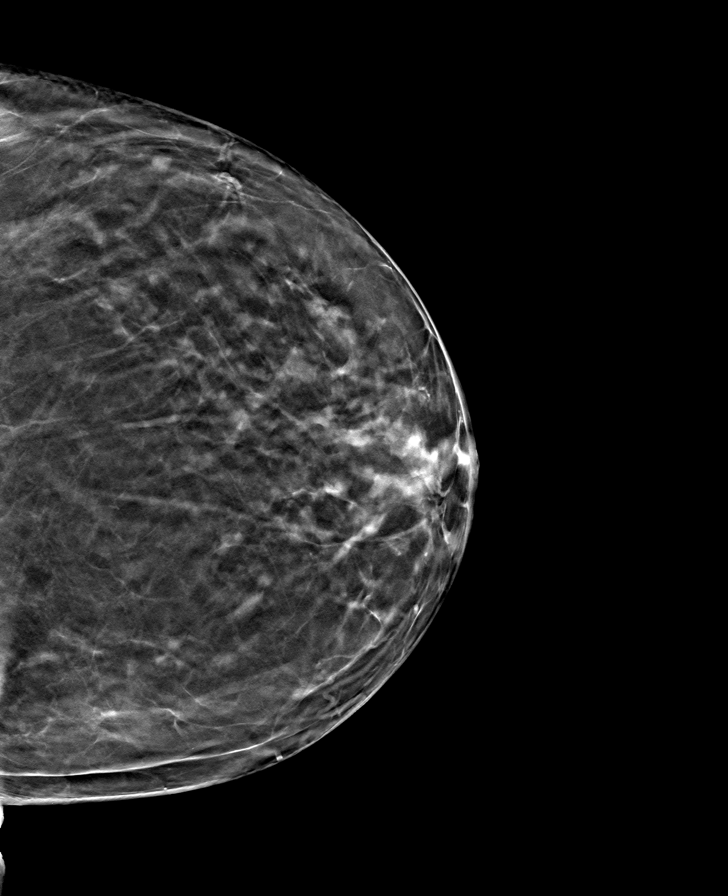

[R MLO tomo · tomo slice 34/67.0]
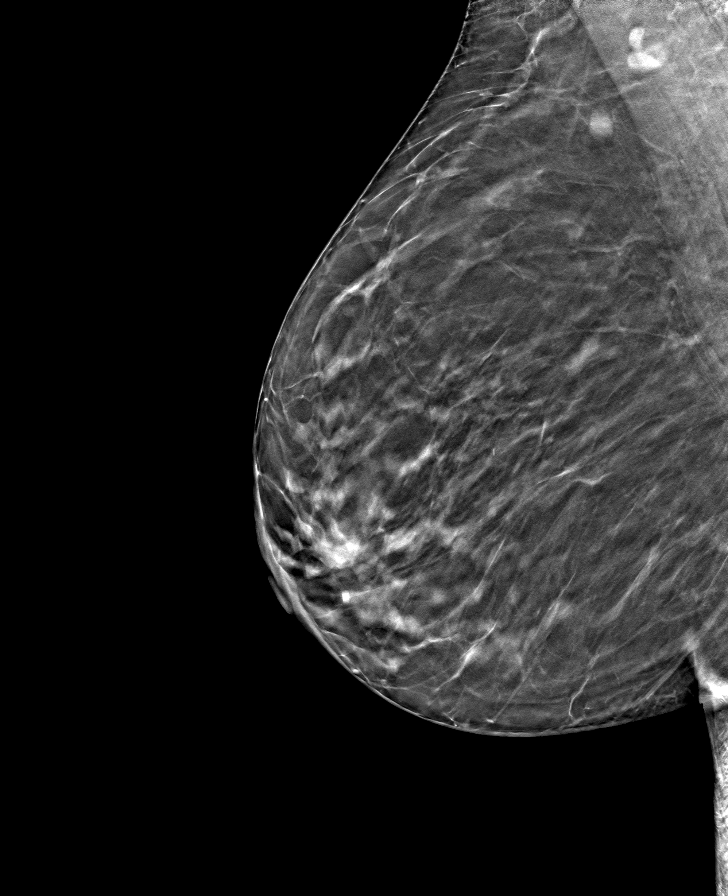

[L MLO tomo · tomo slice 35/70.0]
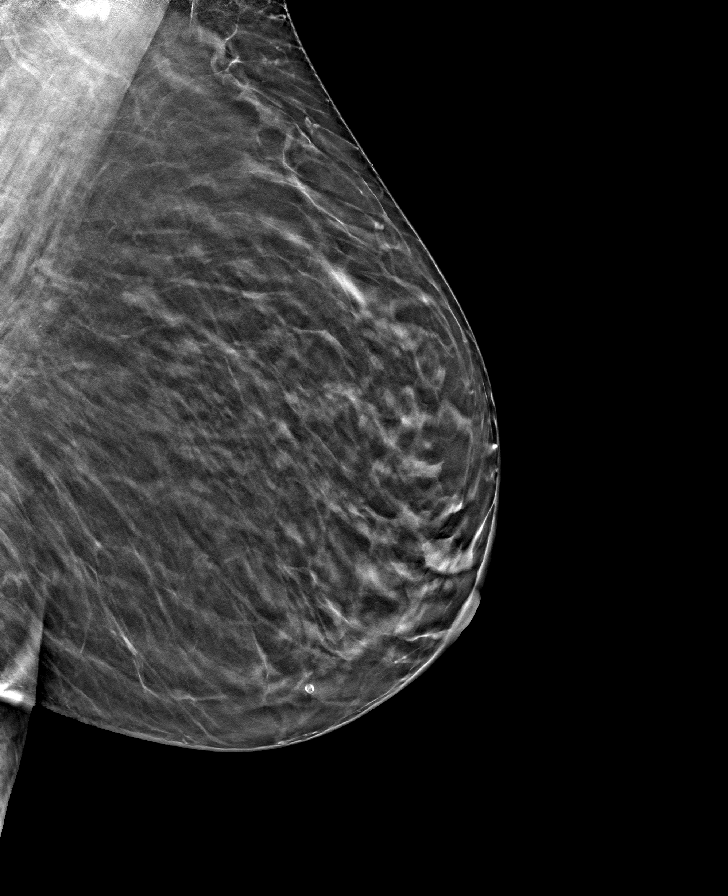

[8 of 24 positions shown; findings below may reference images not displayed]

ACR Breast Density Category b: There are scattered areas of
fibroglandular density.
FINDINGS: There are no findings suspicious for malignancy. Images were
processed with CAD.
IMPRESSION: No mammographic evidence of malignancy. A result letter of this
screening mammogram will be mailed directly to the patient.

RECOMMENDATION:
Screening mammogram in one year. (Code:[TQ])

BI-RADS CATEGORY  1: Negative.

## 2020-06-24 DIAGNOSIS — M8580 Other specified disorders of bone density and structure, unspecified site: Secondary | ICD-10-CM | POA: Diagnosis not present

## 2020-06-24 DIAGNOSIS — E782 Mixed hyperlipidemia: Secondary | ICD-10-CM | POA: Diagnosis not present

## 2020-06-24 DIAGNOSIS — K219 Gastro-esophageal reflux disease without esophagitis: Secondary | ICD-10-CM | POA: Diagnosis not present

## 2020-06-24 DIAGNOSIS — I10 Essential (primary) hypertension: Secondary | ICD-10-CM | POA: Diagnosis not present

## 2020-07-17 DIAGNOSIS — Z23 Encounter for immunization: Secondary | ICD-10-CM | POA: Diagnosis not present

## 2020-08-01 DIAGNOSIS — Z23 Encounter for immunization: Secondary | ICD-10-CM | POA: Diagnosis not present

## 2020-08-13 DIAGNOSIS — Z23 Encounter for immunization: Secondary | ICD-10-CM | POA: Diagnosis not present

## 2020-08-13 DIAGNOSIS — M8580 Other specified disorders of bone density and structure, unspecified site: Secondary | ICD-10-CM | POA: Diagnosis not present

## 2020-08-13 DIAGNOSIS — K219 Gastro-esophageal reflux disease without esophagitis: Secondary | ICD-10-CM | POA: Diagnosis not present

## 2020-08-13 DIAGNOSIS — E782 Mixed hyperlipidemia: Secondary | ICD-10-CM | POA: Diagnosis not present

## 2020-08-13 DIAGNOSIS — I10 Essential (primary) hypertension: Secondary | ICD-10-CM | POA: Diagnosis not present

## 2020-09-09 DIAGNOSIS — I10 Essential (primary) hypertension: Secondary | ICD-10-CM | POA: Diagnosis not present

## 2020-09-09 DIAGNOSIS — K219 Gastro-esophageal reflux disease without esophagitis: Secondary | ICD-10-CM | POA: Diagnosis not present

## 2020-09-09 DIAGNOSIS — M8580 Other specified disorders of bone density and structure, unspecified site: Secondary | ICD-10-CM | POA: Diagnosis not present

## 2020-09-09 DIAGNOSIS — E782 Mixed hyperlipidemia: Secondary | ICD-10-CM | POA: Diagnosis not present

## 2020-09-24 DIAGNOSIS — K219 Gastro-esophageal reflux disease without esophagitis: Secondary | ICD-10-CM | POA: Diagnosis not present

## 2020-09-24 DIAGNOSIS — I10 Essential (primary) hypertension: Secondary | ICD-10-CM | POA: Diagnosis not present

## 2020-09-24 DIAGNOSIS — M8580 Other specified disorders of bone density and structure, unspecified site: Secondary | ICD-10-CM | POA: Diagnosis not present

## 2020-09-24 DIAGNOSIS — E782 Mixed hyperlipidemia: Secondary | ICD-10-CM | POA: Diagnosis not present

## 2020-10-24 DIAGNOSIS — K219 Gastro-esophageal reflux disease without esophagitis: Secondary | ICD-10-CM | POA: Diagnosis not present

## 2020-10-24 DIAGNOSIS — M8580 Other specified disorders of bone density and structure, unspecified site: Secondary | ICD-10-CM | POA: Diagnosis not present

## 2020-10-24 DIAGNOSIS — E782 Mixed hyperlipidemia: Secondary | ICD-10-CM | POA: Diagnosis not present

## 2020-10-24 DIAGNOSIS — I10 Essential (primary) hypertension: Secondary | ICD-10-CM | POA: Diagnosis not present

## 2020-10-30 DIAGNOSIS — R7309 Other abnormal glucose: Secondary | ICD-10-CM | POA: Diagnosis not present

## 2020-10-30 DIAGNOSIS — K219 Gastro-esophageal reflux disease without esophagitis: Secondary | ICD-10-CM | POA: Diagnosis not present

## 2020-10-30 DIAGNOSIS — K7581 Nonalcoholic steatohepatitis (NASH): Secondary | ICD-10-CM | POA: Diagnosis not present

## 2020-10-30 DIAGNOSIS — J309 Allergic rhinitis, unspecified: Secondary | ICD-10-CM | POA: Diagnosis not present

## 2020-10-30 DIAGNOSIS — Z1389 Encounter for screening for other disorder: Secondary | ICD-10-CM | POA: Diagnosis not present

## 2020-10-30 DIAGNOSIS — M8580 Other specified disorders of bone density and structure, unspecified site: Secondary | ICD-10-CM | POA: Diagnosis not present

## 2020-10-30 DIAGNOSIS — E782 Mixed hyperlipidemia: Secondary | ICD-10-CM | POA: Diagnosis not present

## 2020-10-30 DIAGNOSIS — I10 Essential (primary) hypertension: Secondary | ICD-10-CM | POA: Diagnosis not present

## 2020-10-30 DIAGNOSIS — Z0000 Encounter for general adult medical examination without abnormal findings: Secondary | ICD-10-CM | POA: Diagnosis not present

## 2020-12-03 DIAGNOSIS — E782 Mixed hyperlipidemia: Secondary | ICD-10-CM | POA: Diagnosis not present

## 2020-12-03 DIAGNOSIS — K219 Gastro-esophageal reflux disease without esophagitis: Secondary | ICD-10-CM | POA: Diagnosis not present

## 2020-12-03 DIAGNOSIS — I10 Essential (primary) hypertension: Secondary | ICD-10-CM | POA: Diagnosis not present

## 2020-12-03 DIAGNOSIS — M8580 Other specified disorders of bone density and structure, unspecified site: Secondary | ICD-10-CM | POA: Diagnosis not present

## 2020-12-19 DIAGNOSIS — R10821 Right upper quadrant rebound abdominal tenderness: Secondary | ICD-10-CM | POA: Diagnosis not present

## 2020-12-19 DIAGNOSIS — M545 Low back pain, unspecified: Secondary | ICD-10-CM | POA: Diagnosis not present

## 2020-12-23 ENCOUNTER — Other Ambulatory Visit: Payer: Self-pay | Admitting: Internal Medicine

## 2020-12-23 DIAGNOSIS — R1011 Right upper quadrant pain: Secondary | ICD-10-CM

## 2020-12-25 ENCOUNTER — Ambulatory Visit
Admission: RE | Admit: 2020-12-25 | Discharge: 2020-12-25 | Disposition: A | Discharge status: Home / Self Care | Payer: Medicare Other | Source: Ambulatory Visit | Attending: Internal Medicine | Admitting: Internal Medicine

## 2020-12-25 DIAGNOSIS — R1011 Right upper quadrant pain: Secondary | ICD-10-CM

## 2020-12-25 DIAGNOSIS — K8020 Calculus of gallbladder without cholecystitis without obstruction: Secondary | ICD-10-CM | POA: Diagnosis not present

## 2020-12-25 IMAGING — US US ABDOMEN LIMITED RUQ/ASCITES
1 series · 14 of 25 positions shown · non-contrast
Comparison: Abdominal ultrasound dated [DATE] and CT dated
[DATE].

CLINICAL DATA: 86-year-old female with right upper quadrant
abdominal pain.

EXAM:
ULTRASOUND ABDOMEN LIMITED RIGHT UPPER QUADRANT

[Series 1: us abdomen limited ruq/ascites · 0.20mm/px · 14 of 67 slices shown]
[im 1/67]
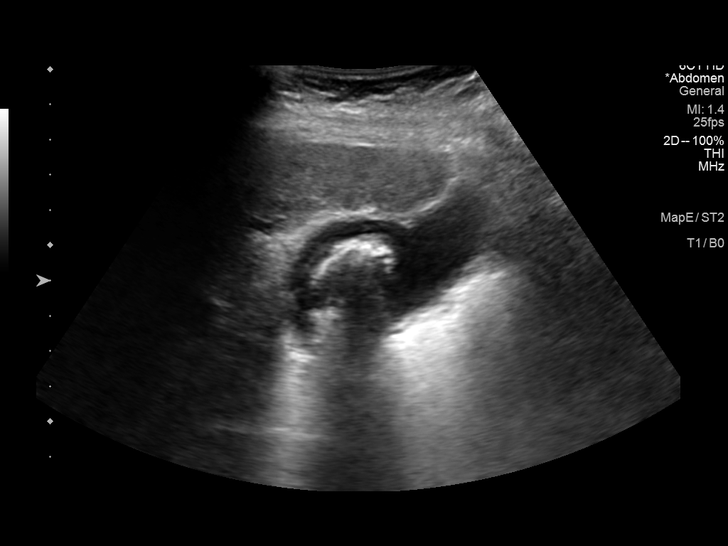
[im 6/67]
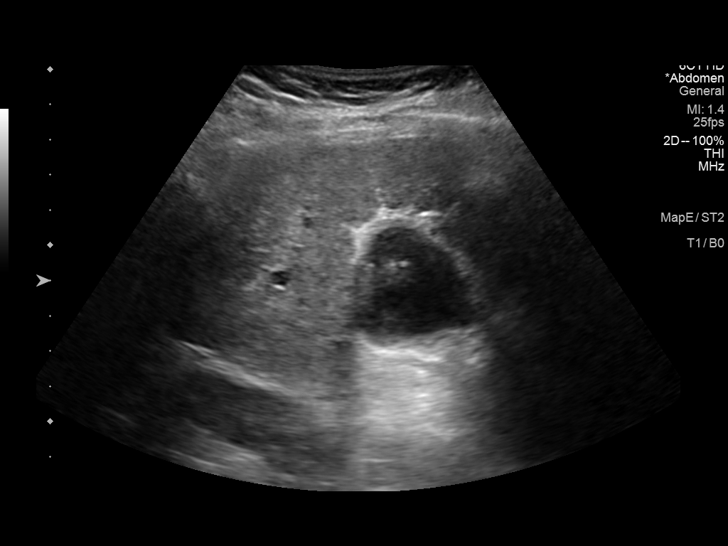
[im 12/67]
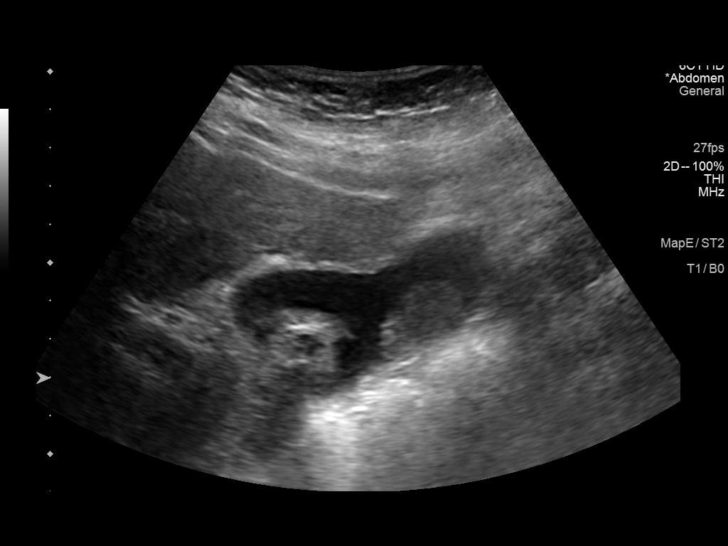
[im 17/67]
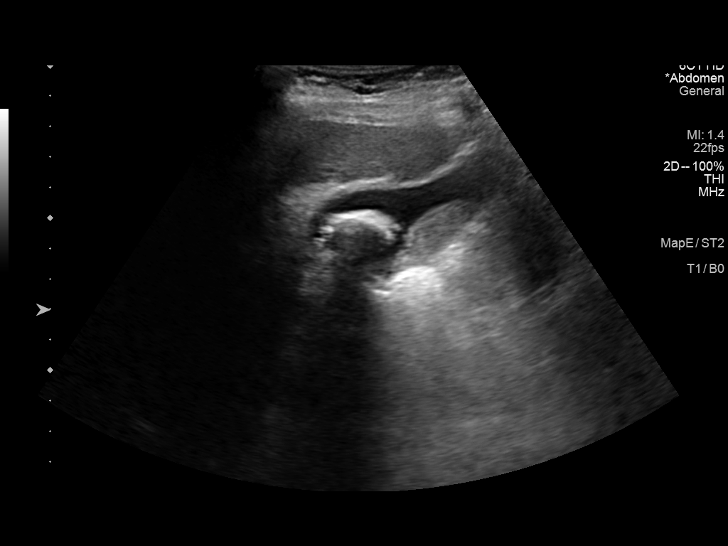
[im 23/67]
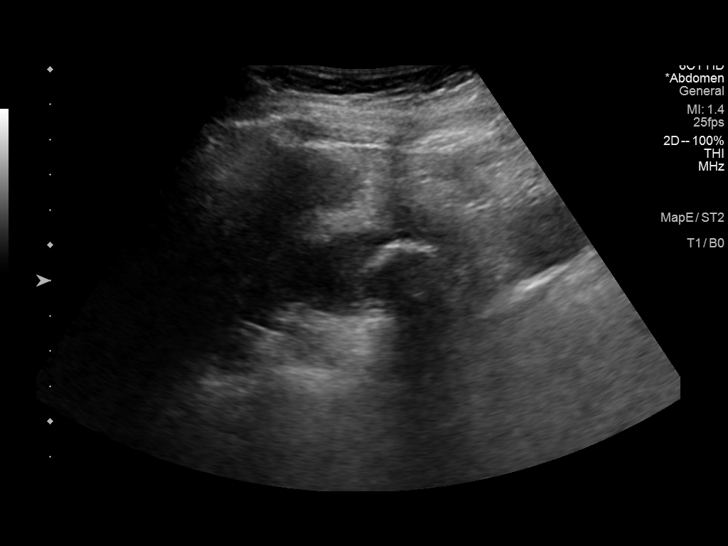
[im 25/67]
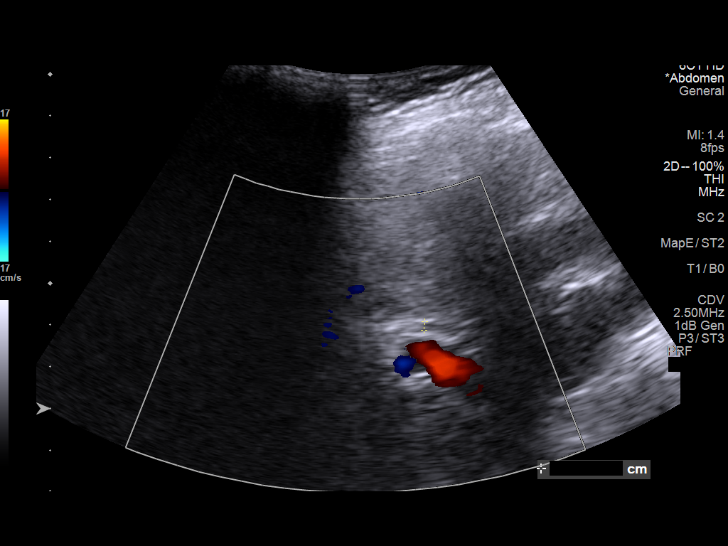
[im 31/67]
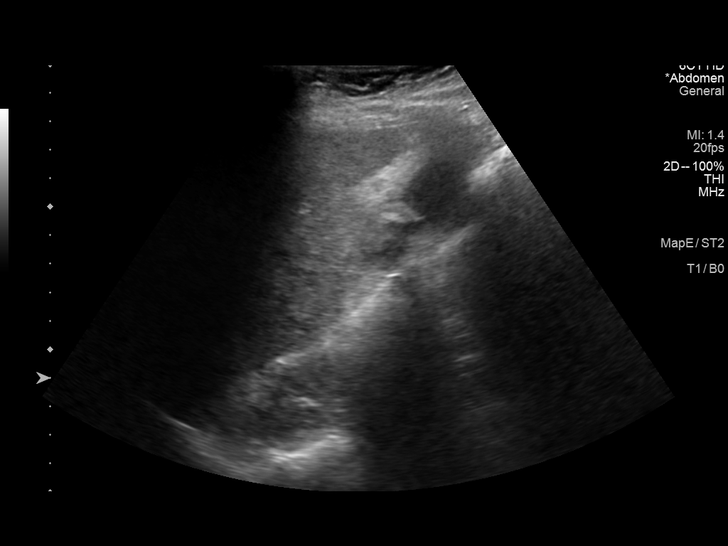
[im 36/67]
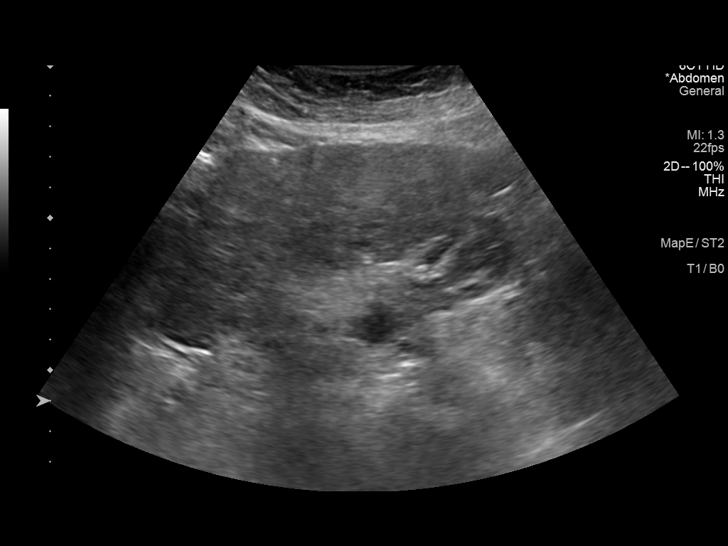
[im 42/67]
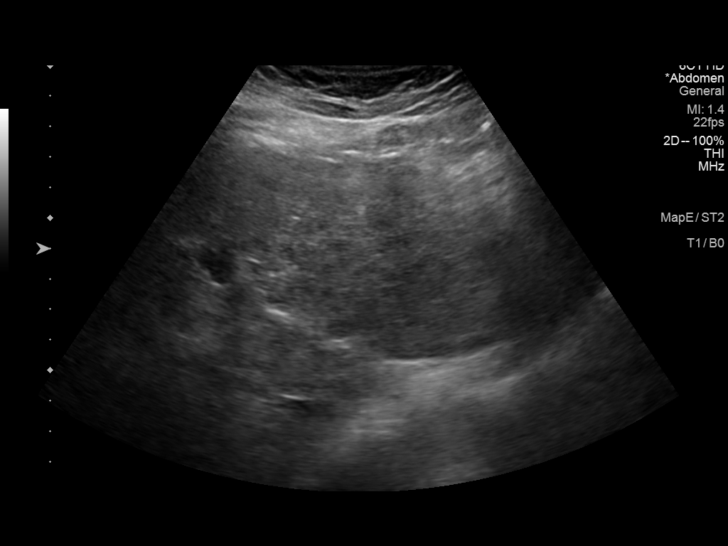
[im 45/67]
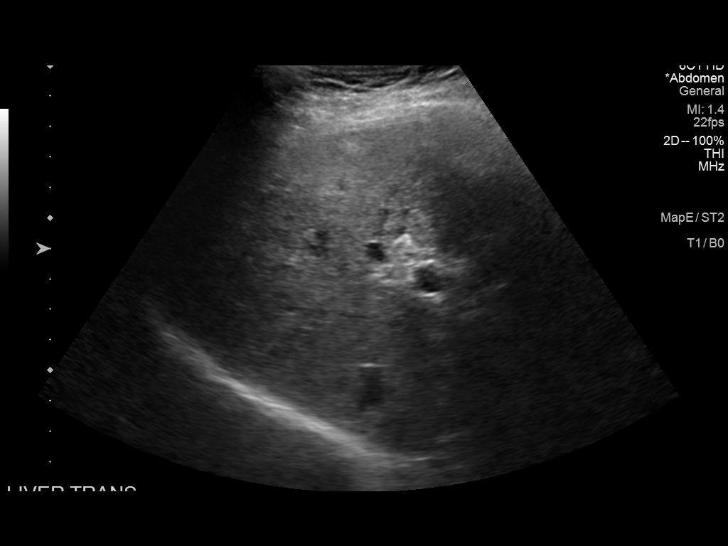
[im 50/67]
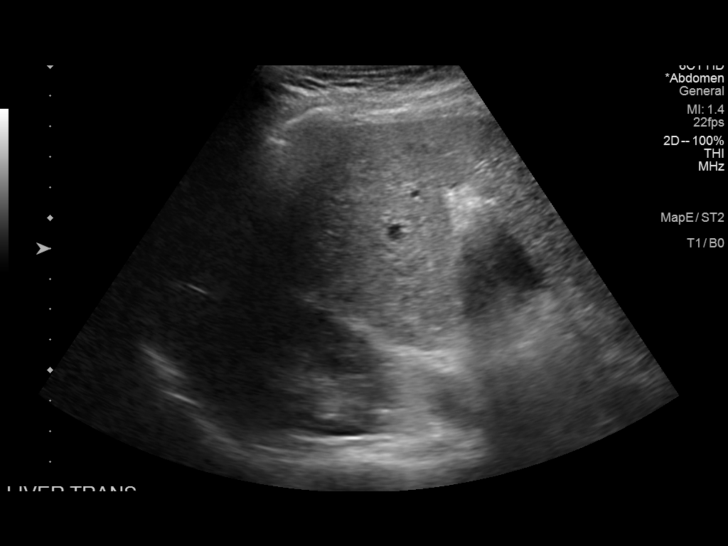
[im 56/67]
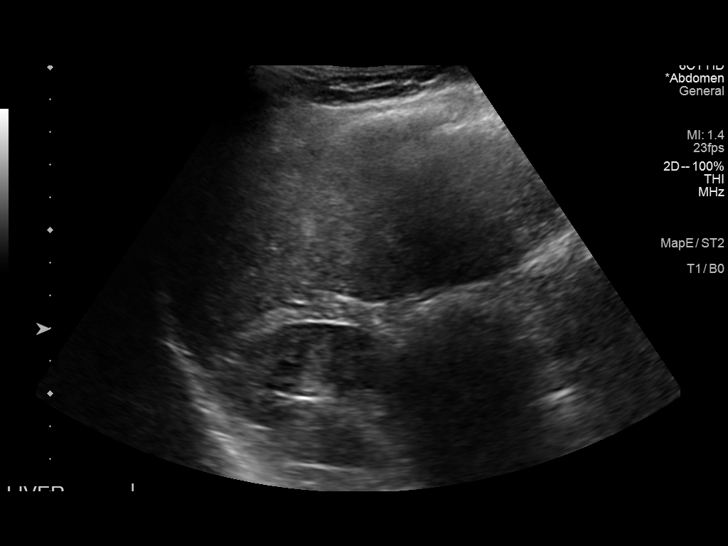
[im 61/67]
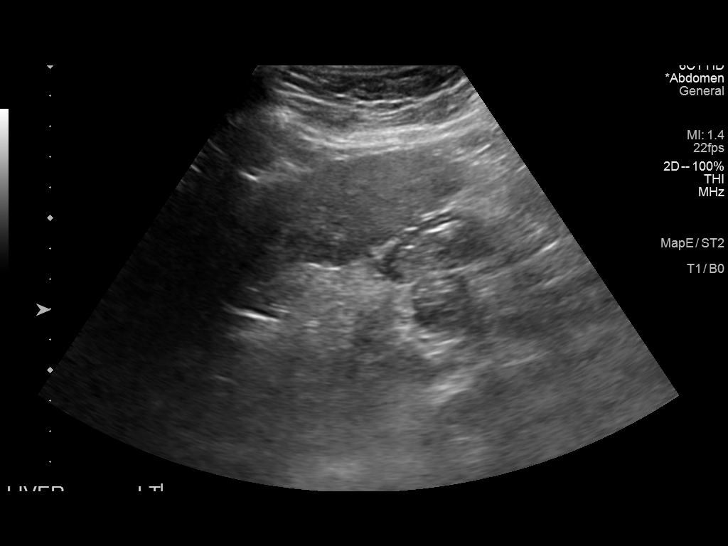
[im 67/67]
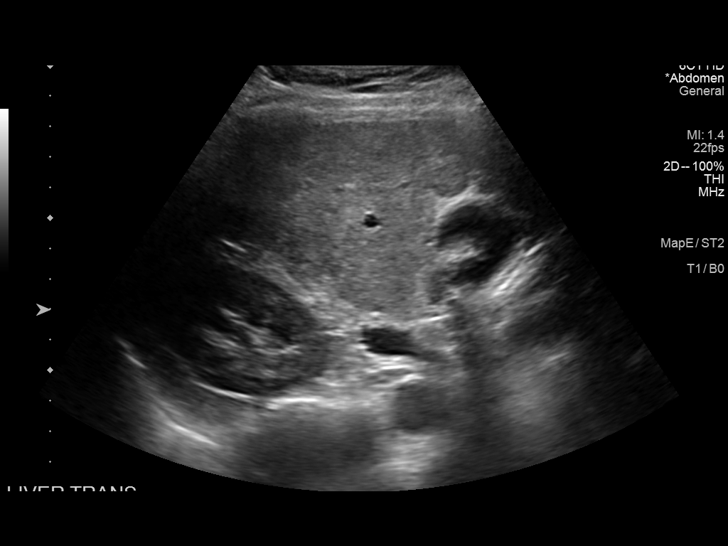

[14 of 25 positions shown; findings below may reference images not displayed]

FINDINGS: Gallbladder:

There is sludge and stone within the gallbladder. No gallbladder
wall thickening or pericholecystic fluid. Negative sonographic
Murphy's sign.

Common bile duct:

Diameter: 2 mm

Liver:

Morphologic changes of cirrhosis. Portal vein is patent on color
Doppler imaging with normal direction of blood flow towards the
liver.

Other: None.
IMPRESSION: 1. Cholelithiasis without sonographic evidence of acute
cholecystitis.
2. Cirrhosis.
3. Patent main portal vein with hepatopetal flow.
4. No ascites.

## 2021-01-02 ENCOUNTER — Other Ambulatory Visit: Payer: Self-pay | Admitting: Internal Medicine

## 2021-01-02 DIAGNOSIS — R1011 Right upper quadrant pain: Secondary | ICD-10-CM

## 2021-01-06 DIAGNOSIS — M8580 Other specified disorders of bone density and structure, unspecified site: Secondary | ICD-10-CM | POA: Diagnosis not present

## 2021-01-06 DIAGNOSIS — I10 Essential (primary) hypertension: Secondary | ICD-10-CM | POA: Diagnosis not present

## 2021-01-06 DIAGNOSIS — E782 Mixed hyperlipidemia: Secondary | ICD-10-CM | POA: Diagnosis not present

## 2021-01-06 DIAGNOSIS — K219 Gastro-esophageal reflux disease without esophagitis: Secondary | ICD-10-CM | POA: Diagnosis not present

## 2021-01-07 ENCOUNTER — Other Ambulatory Visit: Payer: Self-pay | Admitting: Internal Medicine

## 2021-01-07 ENCOUNTER — Ambulatory Visit
Admission: RE | Admit: 2021-01-07 | Discharge: 2021-01-07 | Disposition: A | Discharge status: Home / Self Care | Payer: Medicare Other | Source: Ambulatory Visit | Attending: Internal Medicine | Admitting: Internal Medicine

## 2021-01-07 DIAGNOSIS — R1011 Right upper quadrant pain: Secondary | ICD-10-CM

## 2021-01-07 DIAGNOSIS — K7689 Other specified diseases of liver: Secondary | ICD-10-CM | POA: Diagnosis not present

## 2021-01-07 DIAGNOSIS — K8020 Calculus of gallbladder without cholecystitis without obstruction: Secondary | ICD-10-CM | POA: Diagnosis not present

## 2021-01-07 DIAGNOSIS — S22070A Wedge compression fracture of T9-T10 vertebra, initial encounter for closed fracture: Secondary | ICD-10-CM | POA: Diagnosis not present

## 2021-01-07 DIAGNOSIS — K5900 Constipation, unspecified: Secondary | ICD-10-CM | POA: Diagnosis not present

## 2021-01-07 IMAGING — CT CT ABDOMEN W/O CM
2 of 4 series · 13 of 46 positions shown, 15 images · non-contrast
Comparison: [DATE]

CLINICAL DATA: Right upper abdominal pain, constipation

EXAM:
CT ABDOMEN WITHOUT CONTRAST
TECHNIQUE: Multidetector CT imaging of the abdomen was performed following the
standard protocol without IV contrast.

[Series 2: abd without 5.00 br40 s3 axial · axial · non-contrast · 0.58mm/px · z∈[+1315,+1575]mm · 10 of 58 slices shown, 12 images]
[im 3/58  soft-tissue]
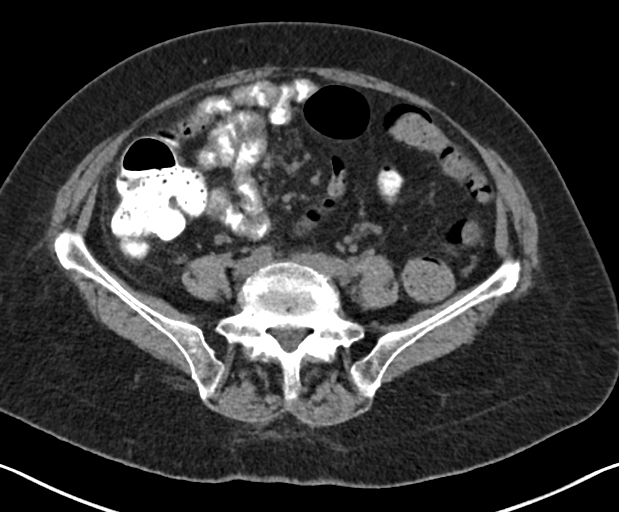
[im 3/58  bone]
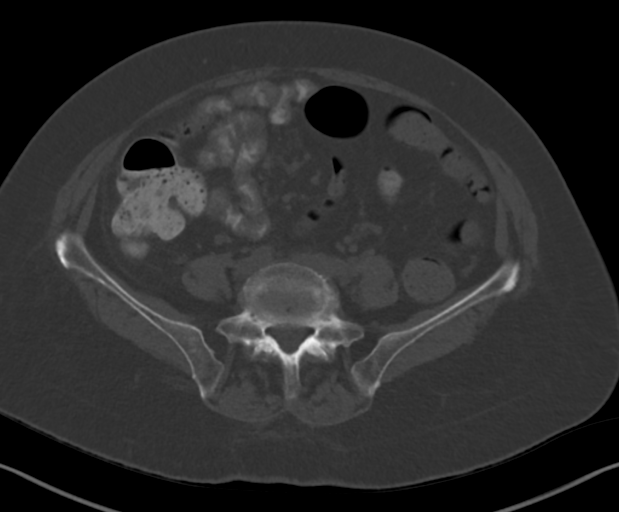
[im 9/58  soft-tissue]
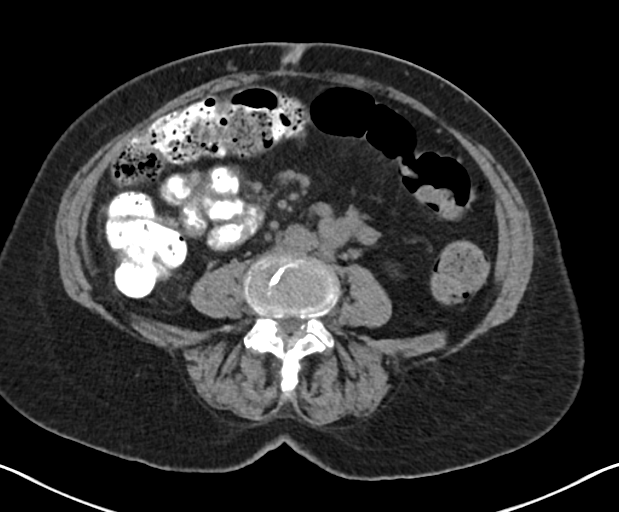
[im 15/58  soft-tissue]
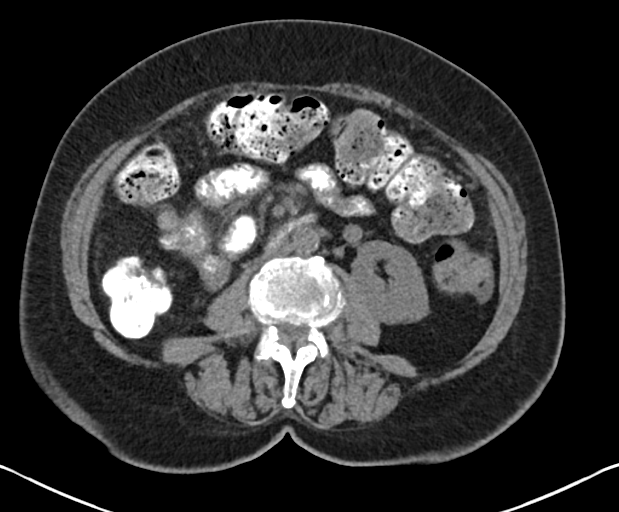
[im 20/58  soft-tissue]
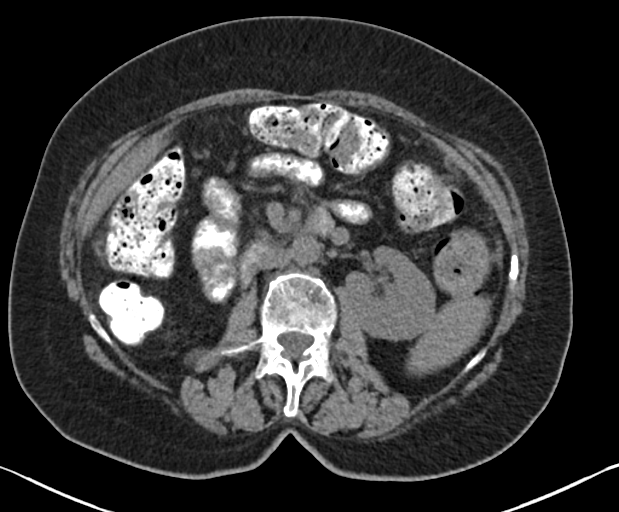
[im 26/58  soft-tissue]
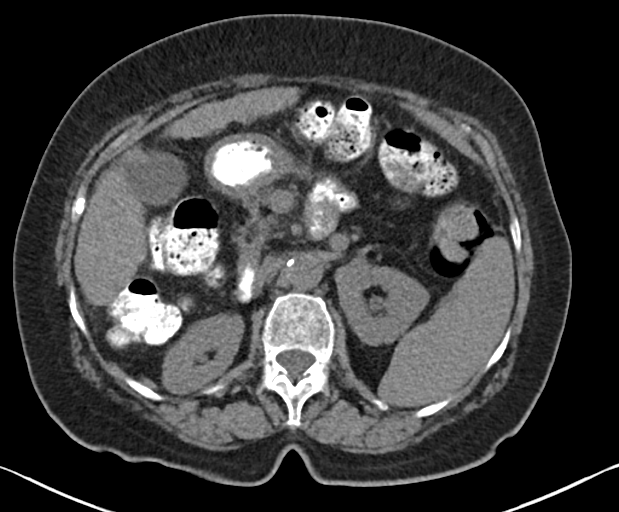
[im 32/58  soft-tissue]
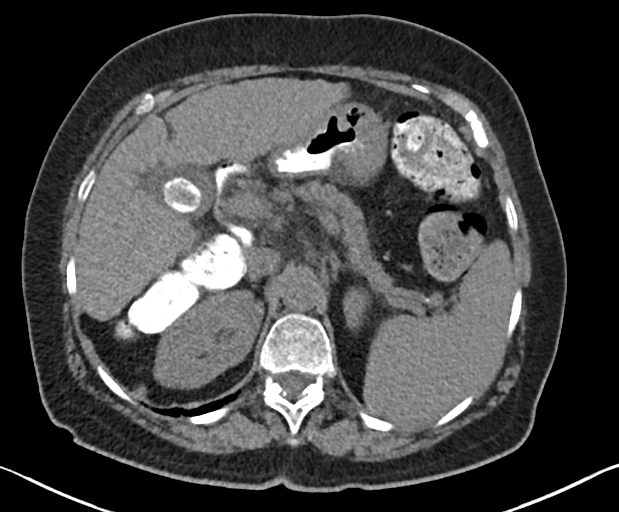
[im 38/58  soft-tissue]
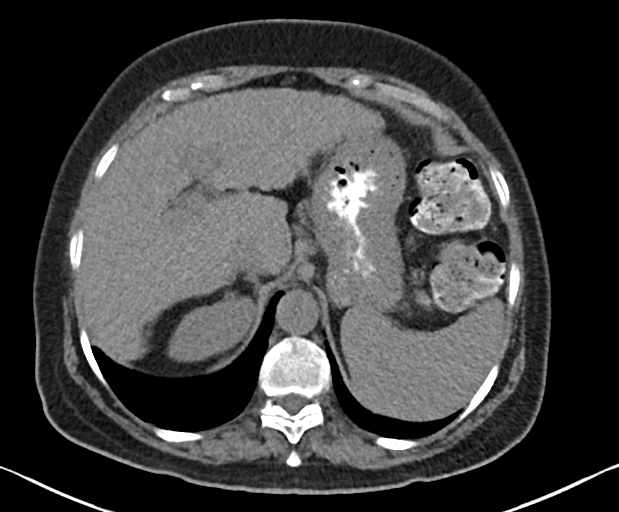
[im 43/58  soft-tissue]
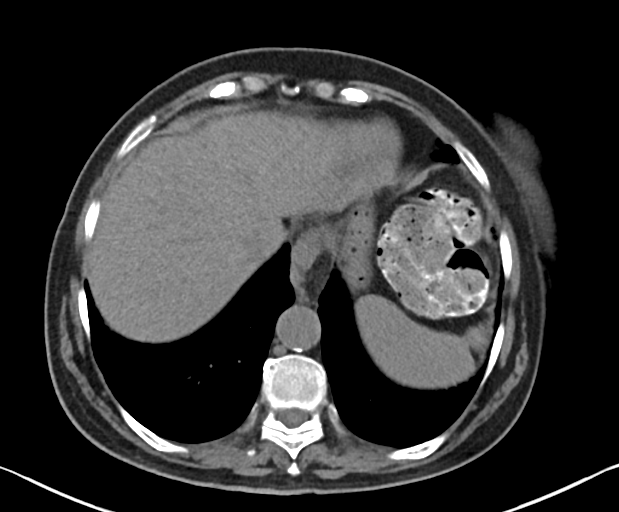
[im 49/58  soft-tissue]
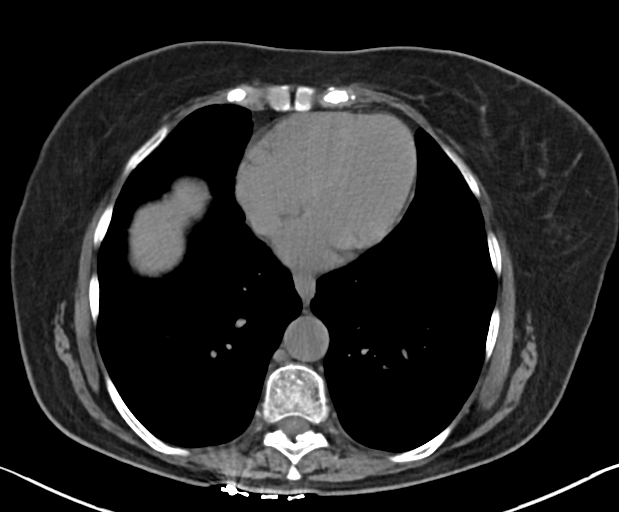
[im 49/58  bone]
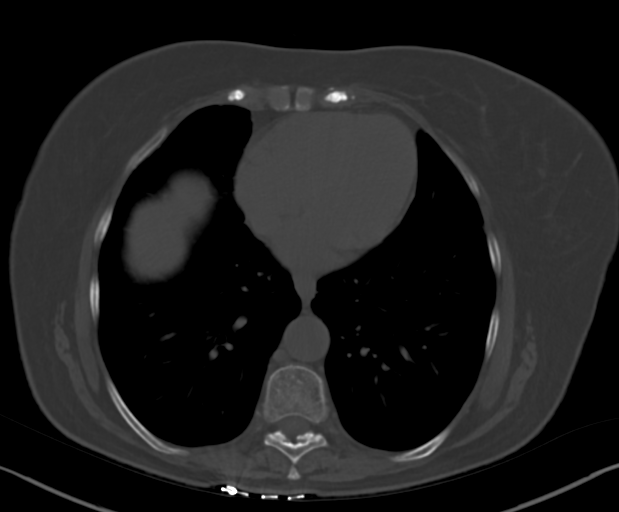
[im 55/58  soft-tissue]
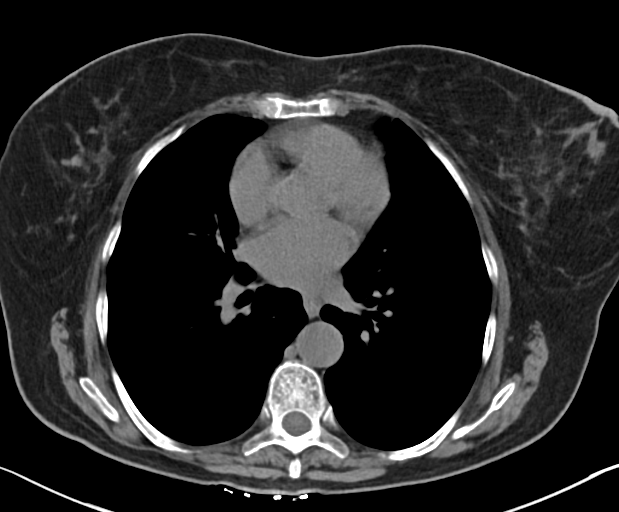

[Series 8: abd without 2.00 br40 s3 cor · coronal · non-contrast · 0.58mm/px · 3 of 147 slices shown]
[im 49/147  soft-tissue]
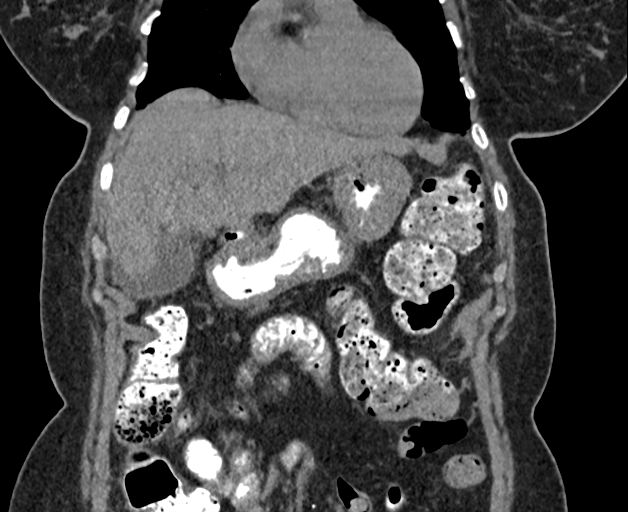
[im 65/147  soft-tissue]
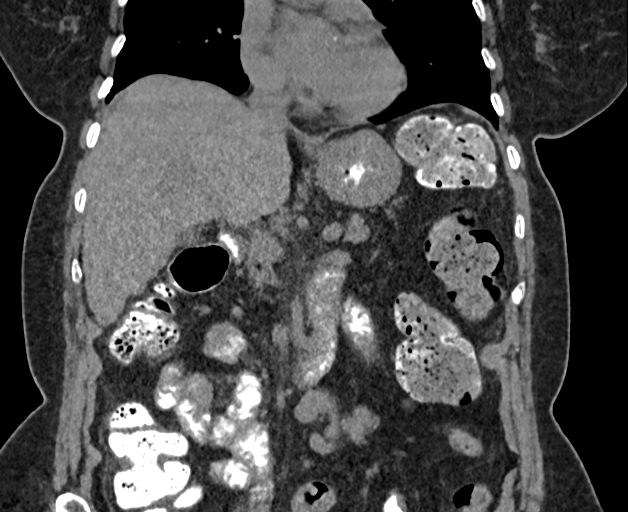
[im 82/147  soft-tissue]
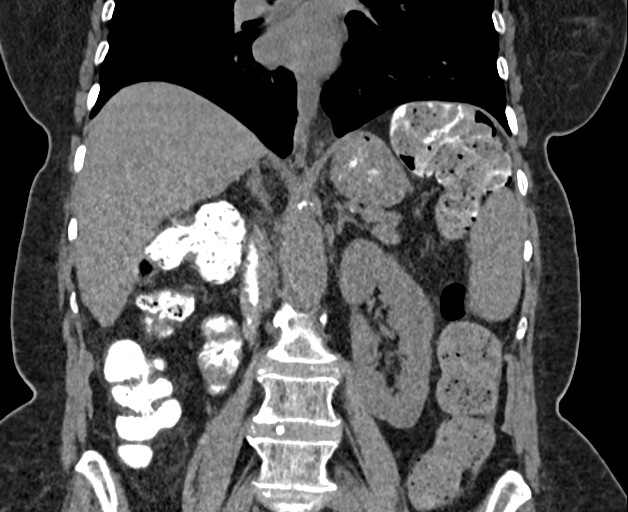

[13 of 46 positions shown; findings below may reference images not displayed]

FINDINGS: Lower chest: No pleural or pericardial effusion. Coronary
calcifications.

Hepatobiliary: Nodular liver contour suggesting cirrhosis, without
focal lesion. 2.6 cm partially calcified stone in the nondistended
gallbladder. No biliary ductal dilatation. A few scattered punctate
calcified granulomas.

Pancreas: Unremarkable. No pancreatic ductal dilatation or
surrounding inflammatory changes.

Spleen: Borderline splenomegaly, 14.1 cm craniocaudal length. No
focal lesion.

Adrenals/Urinary Tract: Adrenal glands are unremarkable. Kidneys are
normal, without renal calculi, focal lesion, or hydronephrosis.

Stomach/Bowel: The stomach is incompletely distended. Visualized
small bowel and colon are nondilated, unremarkable.

Vascular/Lymphatic: Scattered calcified aortic plaque without
aneurysm. Tortuous left retroperitoneal vessels confluent with the
IMV at the level of the aortic bifurcation are new since [5E] and
may represent an atypical portal-systemic collateral venous shunt.
No abdominal or retroperitoneal adenopathy.

Other: No ascites.  No free air.

Musculoskeletal: Progression of the T10 compression fracture
deformity since prior study. No new fracture or worrisome bone
lesion.
IMPRESSION: 1. No acute findings.
2. Nodular liver contour suggesting cirrhosis, with stigmata of
portal venous hypertension.
3. Cholelithiasis
4. Coronary and Aortic Atherosclerosis ([5E]-170.0).

## 2021-01-13 ENCOUNTER — Other Ambulatory Visit: Payer: Self-pay | Admitting: Internal Medicine

## 2021-01-13 ENCOUNTER — Other Ambulatory Visit (HOSPITAL_COMMUNITY): Payer: Self-pay | Admitting: Internal Medicine

## 2021-01-13 DIAGNOSIS — K8020 Calculus of gallbladder without cholecystitis without obstruction: Secondary | ICD-10-CM

## 2021-01-13 DIAGNOSIS — R1011 Right upper quadrant pain: Secondary | ICD-10-CM

## 2021-01-30 ENCOUNTER — Ambulatory Visit (HOSPITAL_COMMUNITY)
Admission: RE | Admit: 2021-01-30 | Discharge: 2021-01-30 | Disposition: A | Discharge status: Home / Self Care | Payer: Medicare Other | Source: Ambulatory Visit | Attending: Internal Medicine | Admitting: Internal Medicine

## 2021-01-30 ENCOUNTER — Other Ambulatory Visit: Payer: Self-pay

## 2021-01-30 DIAGNOSIS — K8020 Calculus of gallbladder without cholecystitis without obstruction: Secondary | ICD-10-CM | POA: Insufficient documentation

## 2021-01-30 DIAGNOSIS — R1011 Right upper quadrant pain: Secondary | ICD-10-CM | POA: Diagnosis not present

## 2021-01-30 DIAGNOSIS — Z8719 Personal history of other diseases of the digestive system: Secondary | ICD-10-CM | POA: Diagnosis not present

## 2021-01-30 IMAGING — NM NM HEPATO W/GB/PHARM/[PERSON_NAME]
2 series · 12 of 12 positions shown · non-contrast
Comparison: Ultrasound from [DATE]

CLINICAL DATA: History of cholelithiasis and gallbladder sludge.

EXAM:
NUCLEAR MEDICINE HEPATOBILIARY IMAGING WITH GALLBLADDER EF
TECHNIQUE: Sequential images of the abdomen were obtained [DATE] minutes
following intravenous administration of radiopharmaceutical. After
oral ingestion of Ensure, gallbladder ejection fraction was
determined. At 60 min, normal ejection fraction is greater than 33%.
RADIOPHARMACEUTICALS:  5.4 mCi [CY]  Choletec IV

[he hepatobiliary · 4.52mm/px · 6 of 59 frames shown (1 of 2)]
[frame 5/59]
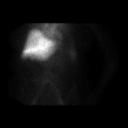
[frame 15/59]
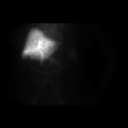
[frame 25/59]
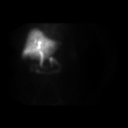
[frame 35/59]
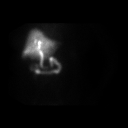
[frame 45/59]
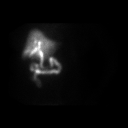
[frame 55/59]
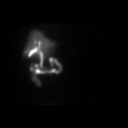

[he hepatobiliary · 4.52mm/px · 6 of 60 frames shown (2 of 2)]
[frame 6/60]
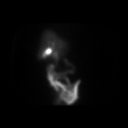
[frame 16/60]
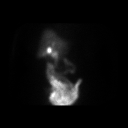
[frame 26/60]
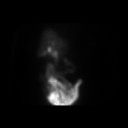
[frame 36/60]
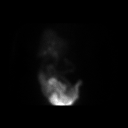
[frame 46/60]
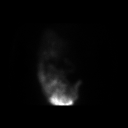
[frame 56/60]
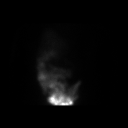

[12 of 12 positions shown; findings below may reference images not displayed]

FINDINGS: Prompt uptake and biliary excretion of activity by the liver is
seen. Gallbladder activity is visualized, consistent with patency of
cystic duct. Biliary activity passes into small bowel, consistent
with patent common bile duct.

Calculated gallbladder ejection fraction is 88%. (Normal gallbladder
ejection fraction with Ensure is greater than 33%.)
IMPRESSION: Normal uptake and excretion of biliary tracer.

Normal gallbladder ejection fraction.

## 2021-01-30 MED ORDER — TECHNETIUM TC 99M MEBROFENIN IV KIT
5.4000 | PACK | Freq: Once | INTRAVENOUS | Status: AC | PRN
  Administered 2021-01-30: 10:00:00 5.4 via INTRAVENOUS

## 2021-02-04 ENCOUNTER — Other Ambulatory Visit (HOSPITAL_COMMUNITY): Payer: Self-pay | Admitting: Internal Medicine

## 2021-02-04 ENCOUNTER — Other Ambulatory Visit: Payer: Self-pay | Admitting: Internal Medicine

## 2021-02-04 DIAGNOSIS — R1011 Right upper quadrant pain: Secondary | ICD-10-CM

## 2021-02-11 ENCOUNTER — Other Ambulatory Visit (HOSPITAL_COMMUNITY): Payer: Self-pay | Admitting: Internal Medicine

## 2021-02-11 ENCOUNTER — Other Ambulatory Visit: Payer: Self-pay

## 2021-02-11 ENCOUNTER — Ambulatory Visit (HOSPITAL_COMMUNITY)
Admission: RE | Admit: 2021-02-11 | Discharge: 2021-02-11 | Disposition: A | Discharge status: Home / Self Care | Payer: Medicare Other | Source: Ambulatory Visit | Attending: Internal Medicine | Admitting: Internal Medicine

## 2021-02-11 DIAGNOSIS — R1011 Right upper quadrant pain: Secondary | ICD-10-CM

## 2021-02-11 DIAGNOSIS — K8020 Calculus of gallbladder without cholecystitis without obstruction: Secondary | ICD-10-CM | POA: Diagnosis not present

## 2021-02-11 DIAGNOSIS — K7460 Unspecified cirrhosis of liver: Secondary | ICD-10-CM | POA: Diagnosis not present

## 2021-02-11 IMAGING — MR MR 3D RECON AT [facility]
17 of 19 series · 45 of 48 positions shown · IV contrast (7ML GADAVIST)
Comparison: None.

CLINICAL DATA: Right upper quadrant pain. Cirrhosis.
Cholelithiasis.

EXAM:
MRI ABDOMEN WITHOUT AND WITH CONTRAST (INCLUDING MRCP)
TECHNIQUE: Multiplanar multisequence MR imaging of the abdomen was performed
both before and after the administration of intravenous contrast.
Heavily T2-weighted images of the biliary and pancreatic ducts were
obtained, and three-dimensional MRCP images were rendered by post
processing.
CONTRAST:  7mL GADAVIST GADOBUTROL 1 MMOL/ML IV SOLN

[Series 3: DWI · axial · 6.0mm · 1.49mm/px · z∈[-115,+173]mm · 4 of 82 slices shown (1 of 2)]
[im 1/82]
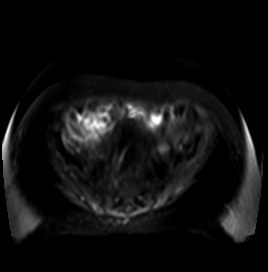
[im 28/82]
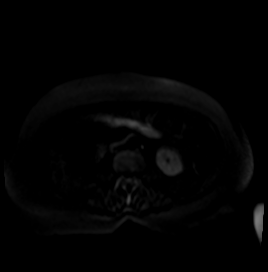
[im 55/82]
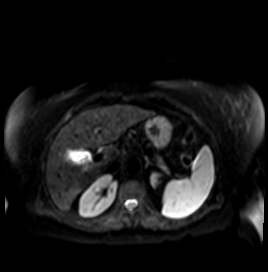
[im 82/82]
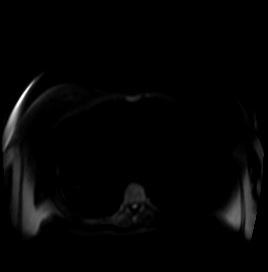

[Series 4: DWI · axial · 6.0mm · 1.49mm/px · z∈[-115,+173]mm · 3 of 41 slices shown (2 of 2)]
[im 1/41]
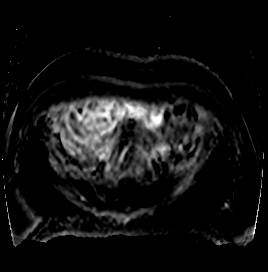
[im 21/41]
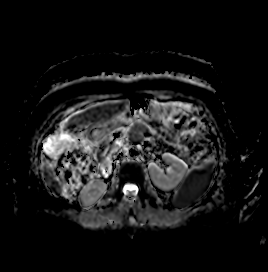
[im 41/41]
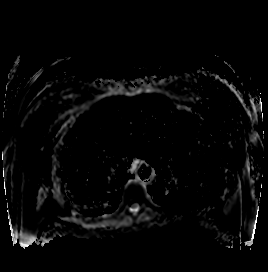

[Series 5: T2 fat-sat · axial · 6.0mm · 1.25mm/px · z∈[-101,+166]mm · 2 of 38 slices shown]
[im 1/38]
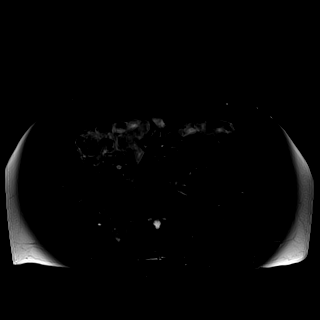
[im 38/38]
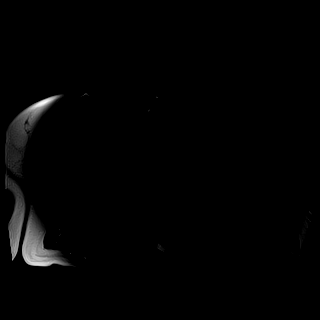

[Series 7: T2 · coronal · 7.0mm · 1.56mm/px · 1 of 30 slices shown (1 of 2)]
[im 1/30]
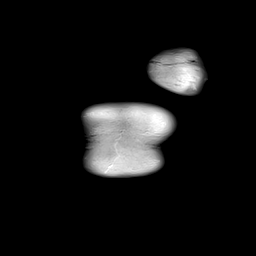

[Series 8: T1 · axial · 3.1mm · 1.25mm/px · z∈[-100,+170]mm · 3 of 88 slices shown (1 of 2)]
[im 1/88]
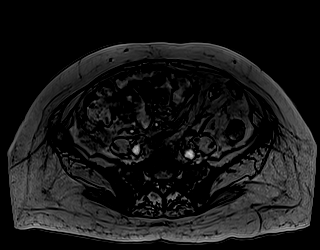
[im 44/88]
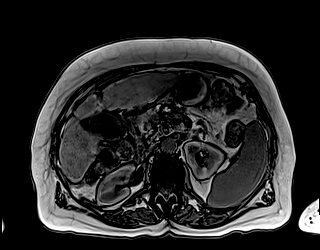
[im 88/88]
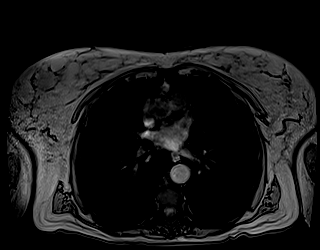

[Series 9: T1 · axial · 3.1mm · 1.25mm/px · z∈[-100,+170]mm · 3 of 88 slices shown (2 of 2)]
[im 1/88]
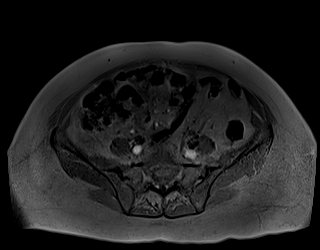
[im 44/88]
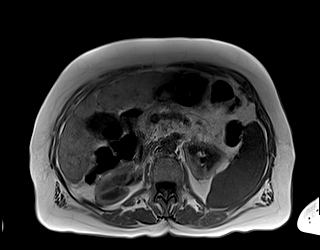
[im 88/88]
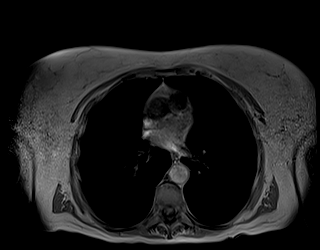

[Series 10: bSSFP · axial · 7.0mm · 1.25mm/px · 1 of 33 slices shown]
[im 1/33]
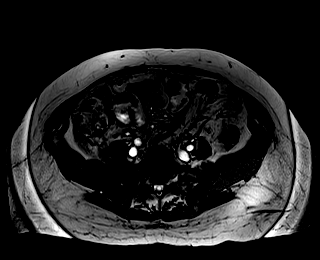

[Series 13: T1 dynamic · axial · 3.0mm · 1.25mm/px · z∈[-87,+174]mm · 3 of 88 slices shown (1 of 9)]
[im 1/88]
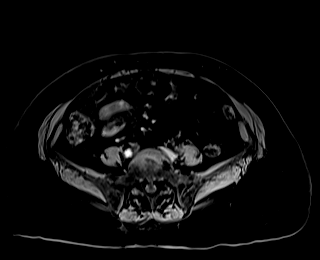
[im 44/88]
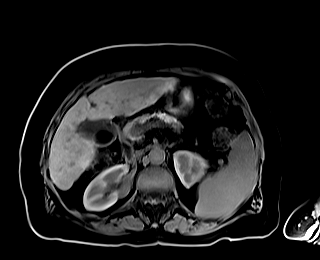
[im 88/88]
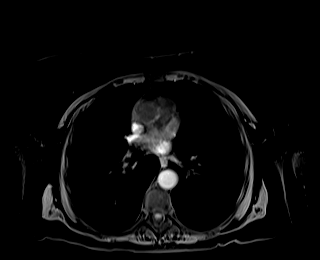

[Series 17: T1 dynamic · axial · 3.0mm · 1.25mm/px · z∈[-87,+174]mm · 3 of 88 slices shown (2 of 9)]
[im 1/88]
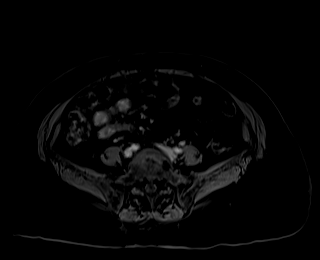
[im 44/88]
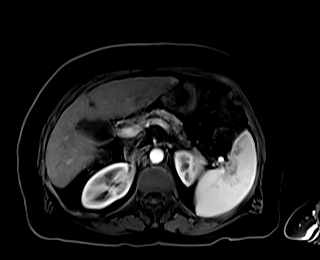
[im 88/88]
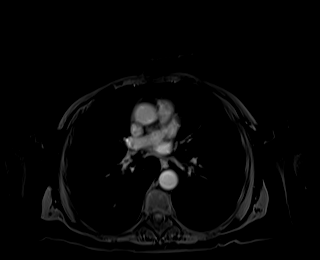

[Series 18: T1 dynamic · axial · 3.0mm · 1.25mm/px · z∈[-87,+174]mm · 3 of 88 slices shown (3 of 9)]
[im 1/88]
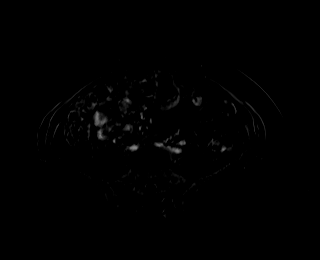
[im 44/88]
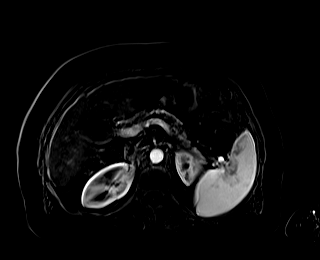
[im 88/88]
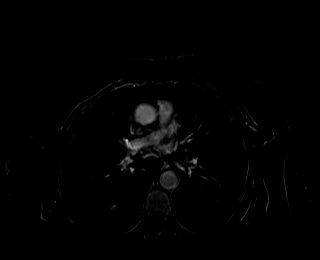

[Series 21: T1 dynamic · axial · 3.0mm · 1.25mm/px · z∈[-87,+174]mm · 3 of 88 slices shown (4 of 9)]
[im 1/88]
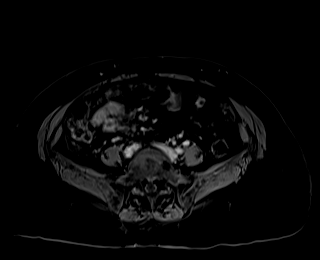
[im 44/88]
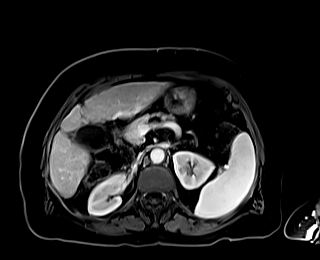
[im 88/88]
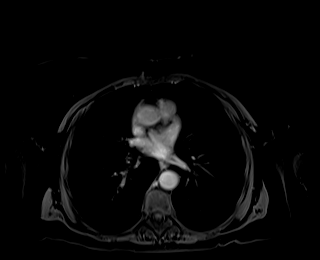

[Series 22: T1 dynamic · axial · 3.0mm · 1.25mm/px · z∈[-87,+174]mm · 3 of 88 slices shown (5 of 9)]
[im 1/88]
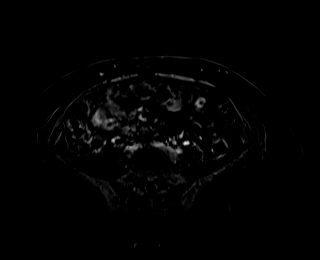
[im 44/88]
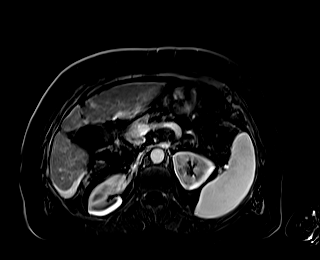
[im 88/88]
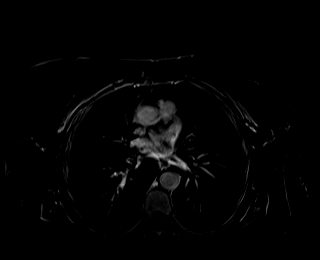

[Series 25: T1 dynamic · axial · 3.0mm · 1.25mm/px · z∈[-87,+174]mm · 3 of 88 slices shown (6 of 9)]
[im 1/88]
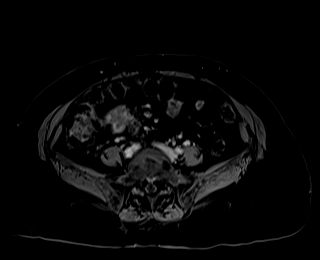
[im 44/88]
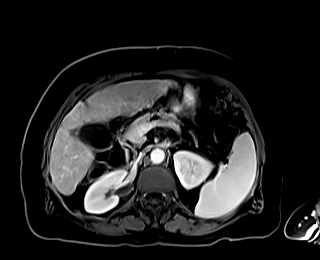
[im 88/88]
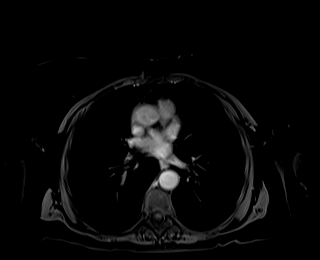

[Series 26: T1 dynamic · axial · 3.0mm · 1.25mm/px · z∈[-87,+174]mm · 3 of 88 slices shown (7 of 9)]
[im 1/88]
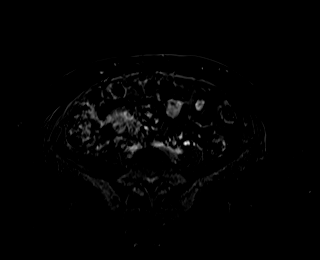
[im 44/88]
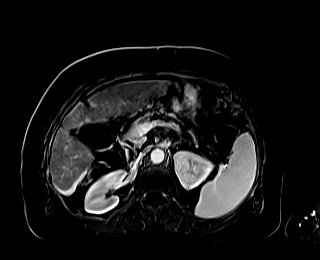
[im 88/88]
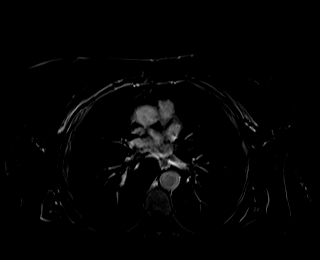

[Series 29: T2 · axial · 6.0mm · 1.56mm/px · 1 of 38 slices shown (2 of 2)]
[im 1/38]
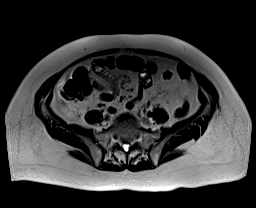

[Series 33: T1 dynamic · axial · 3.0mm · 1.25mm/px · z∈[-87,+174]mm · 3 of 88 slices shown (8 of 9)]
[im 1/88]
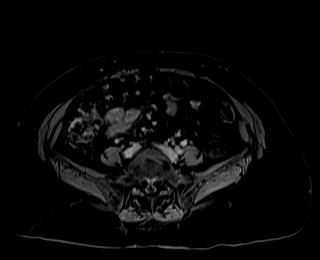
[im 44/88]
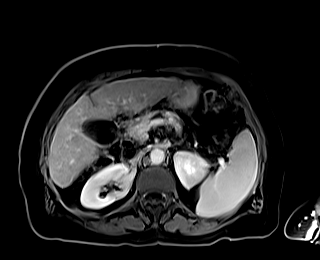
[im 88/88]
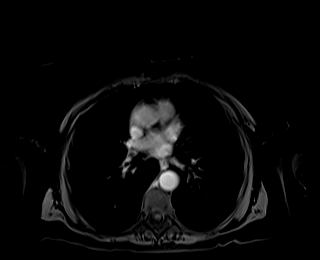

[Series 34: T1 dynamic · axial · 3.0mm · 1.25mm/px · z∈[-87,+174]mm · 3 of 88 slices shown (9 of 9)]
[im 1/88]
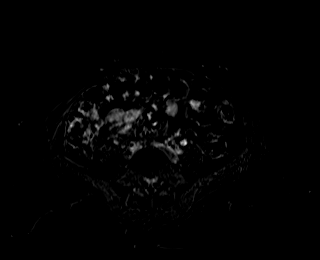
[im 44/88]
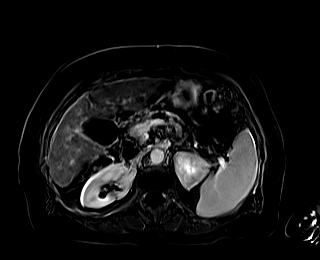
[im 88/88]
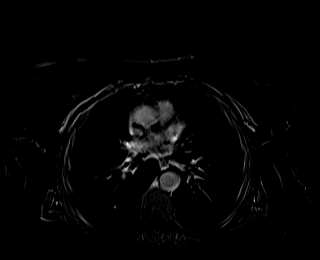

[45 of 48 positions shown; findings below may reference images not displayed]

FINDINGS: Lower chest: No acute findings.

Hepatobiliary: Hepatic cirrhosis is demonstrated, however no hepatic
masses are identified. Gallstones are seen measuring up to 2.4 cm,
however there is no evidence of cholecystitis or biliary ductal
dilatation.

Pancreas: No evidence of pancreatic mass, acute pancreatitis, or
pancreatic ductal dilatation. The pancreas shows diffuse atrophy and
there is a beaded appearance of the pancreatic duct, suggesting
chronic pancreatitis.

Spleen: Mild splenomegaly, consistent with portal venous
hypertension. No splenic masses identified.

Adrenals/Urinary Tract: No masses identified. Tiny sub-cm left renal
cyst noted. No evidence of hydronephrosis.

Stomach/Bowel: Visualized portion unremarkable.

Vascular/Lymphatic: No pathologically enlarged lymph nodes
identified. No acute vascular findings. Mild portosystemic venous
collaterals and esophageal varices, consistent with portal venous
hypertension.

Other:  No evidence of abdominal ascites.

Musculoskeletal:  No suspicious bone lesions identified.
IMPRESSION: No acute findings.

Hepatic cirrhosis. No evidence of hepatic neoplasm.

Findings of portal venous hypertension including mild splenomegaly,
portosystemic venous collaterals, and esophageal varices.

Cholelithiasis. No radiographic evidence of cholecystitis or biliary
ductal dilatation.

## 2021-02-11 IMAGING — MR MR ABDOMEN WO/W CM
18 of 20 series · 45 of 48 positions shown · IV contrast (7ML GADAVIST)
Comparison: None.

CLINICAL DATA: Right upper quadrant pain. Cirrhosis.
Cholelithiasis.

EXAM:
MRI ABDOMEN WITHOUT AND WITH CONTRAST (INCLUDING MRCP)
TECHNIQUE: Multiplanar multisequence MR imaging of the abdomen was performed
both before and after the administration of intravenous contrast.
Heavily T2-weighted images of the biliary and pancreatic ducts were
obtained, and three-dimensional MRCP images were rendered by post
processing.
CONTRAST:  7mL GADAVIST GADOBUTROL 1 MMOL/ML IV SOLN

[Series 3: DWI · axial · 6.0mm · 1.49mm/px · z∈[-115,+173]mm · 4 of 82 slices shown (1 of 2)]
[im 1/82]
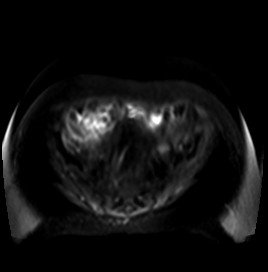
[im 28/82]
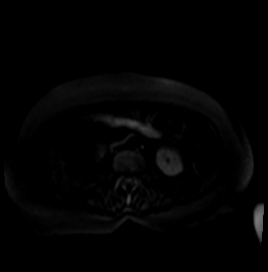
[im 55/82]
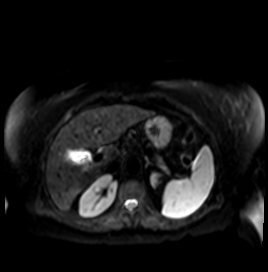
[im 82/82]
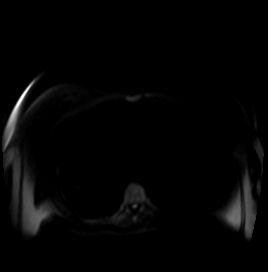

[Series 4: DWI · axial · 6.0mm · 1.49mm/px · z∈[-115,+173]mm · 2 of 41 slices shown (2 of 2)]
[im 1/41]
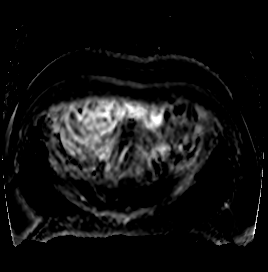
[im 41/41]
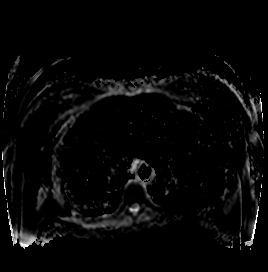

[Series 5: T2 fat-sat · axial · 6.0mm · 1.25mm/px · 1 of 38 slices shown]
[im 1/38]
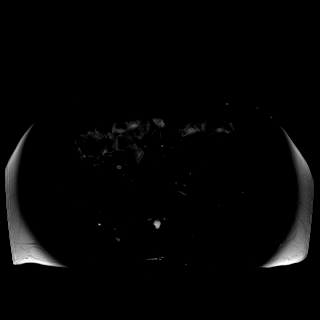

[Series 7: T2 · coronal · 7.0mm · 1.56mm/px · 1 of 30 slices shown (1 of 2)]
[im 1/30]
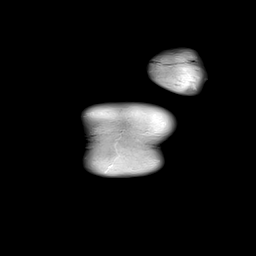

[Series 8: T1 · axial · 3.1mm · 1.25mm/px · z∈[-100,+170]mm · 3 of 88 slices shown (1 of 2)]
[im 1/88]
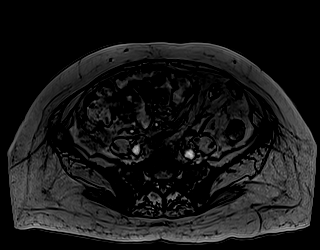
[im 44/88]
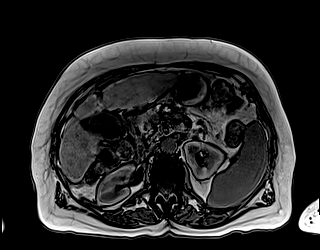
[im 88/88]
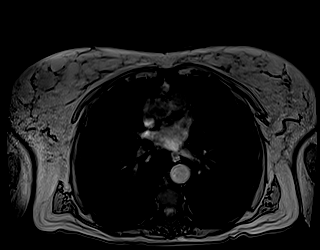

[Series 9: T1 · axial · 3.1mm · 1.25mm/px · z∈[-100,+170]mm · 3 of 88 slices shown (2 of 2)]
[im 1/88]
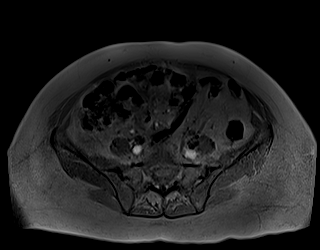
[im 44/88]
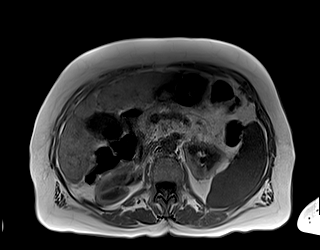
[im 88/88]
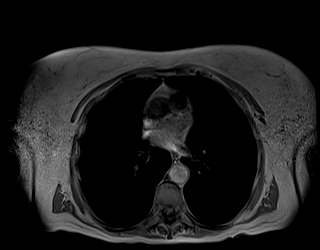

[Series 10: bSSFP · axial · 7.0mm · 1.25mm/px · 1 of 33 slices shown]
[im 1/33]
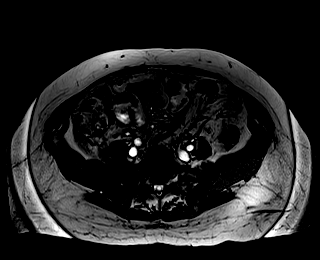

[Series 13: T1 dynamic · axial · 3.0mm · 1.25mm/px · z∈[-87,+174]mm · 3 of 88 slices shown (1 of 10)]
[im 1/88]
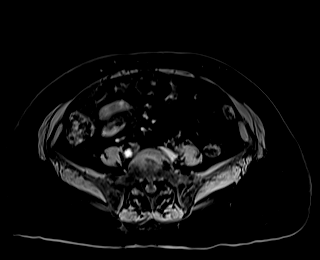
[im 44/88]
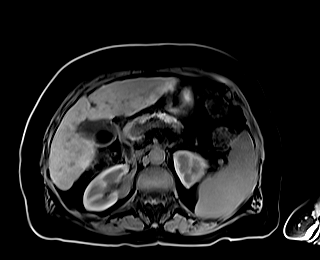
[im 88/88]
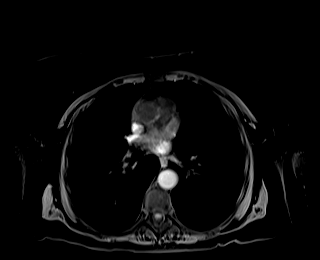

[Series 17: T1 dynamic · axial · 3.0mm · 1.25mm/px · z∈[-87,+174]mm · 3 of 88 slices shown (2 of 10)]
[im 1/88]
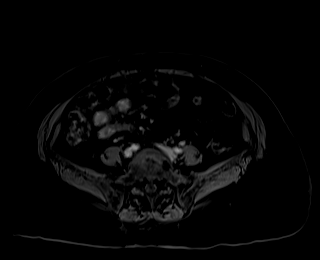
[im 44/88]
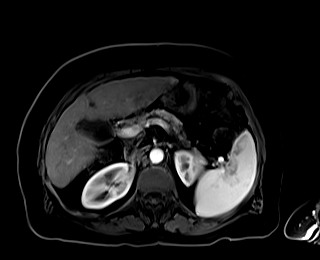
[im 88/88]
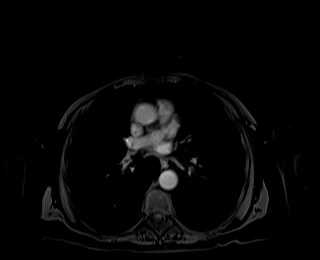

[Series 18: T1 dynamic · axial · 3.0mm · 1.25mm/px · z∈[-87,+174]mm · 3 of 88 slices shown (3 of 10)]
[im 1/88]
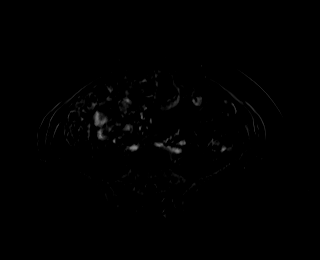
[im 44/88]
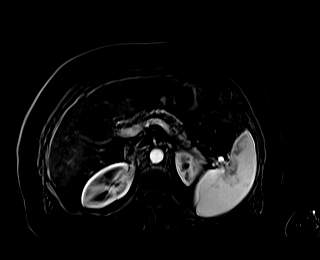
[im 88/88]
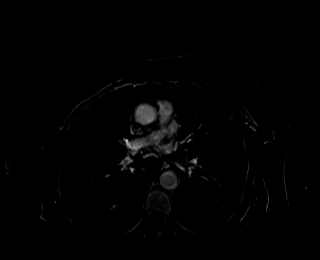

[Series 21: T1 dynamic · axial · 3.0mm · 1.25mm/px · z∈[-87,+174]mm · 3 of 88 slices shown (4 of 10)]
[im 1/88]
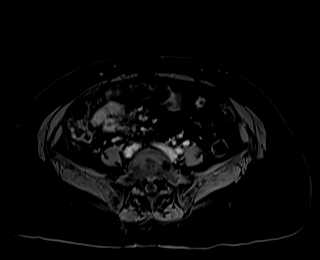
[im 44/88]
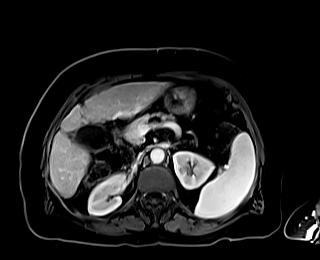
[im 88/88]
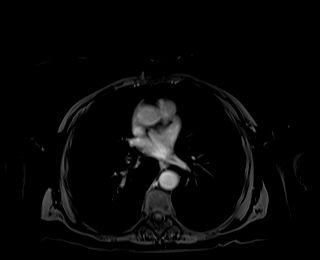

[Series 22: T1 dynamic · axial · 3.0mm · 1.25mm/px · z∈[-87,+174]mm · 3 of 88 slices shown (5 of 10)]
[im 1/88]
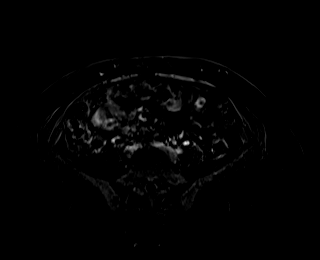
[im 44/88]
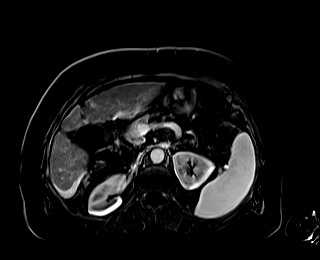
[im 88/88]
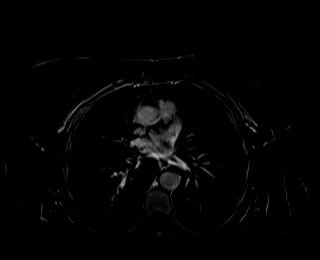

[Series 25: T1 dynamic · axial · 3.0mm · 1.25mm/px · z∈[-87,+174]mm · 3 of 88 slices shown (6 of 10)]
[im 1/88]
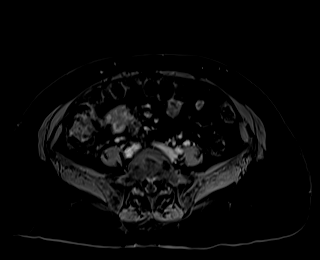
[im 44/88]
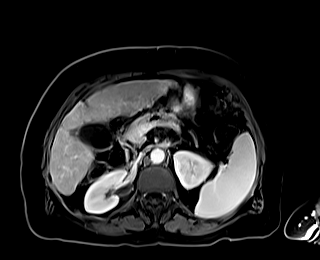
[im 88/88]
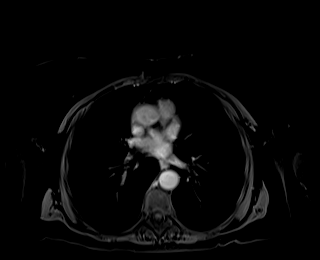

[Series 26: T1 dynamic · axial · 3.0mm · 1.25mm/px · z∈[-87,+174]mm · 3 of 88 slices shown (7 of 10)]
[im 1/88]
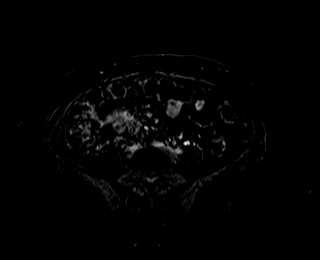
[im 44/88]
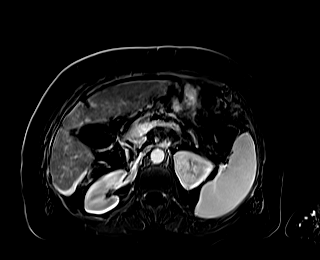
[im 88/88]
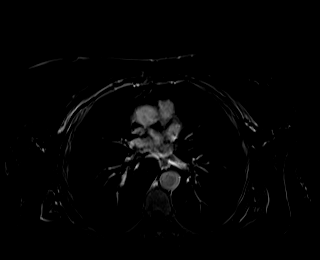

[Series 28: T1 dynamic · coronal · 5.0mm · 1.41mm/px · 2 of 52 slices shown (8 of 10)]
[im 1/52]
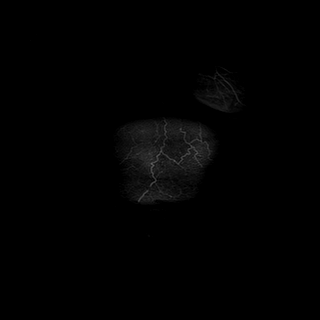
[im 52/52]
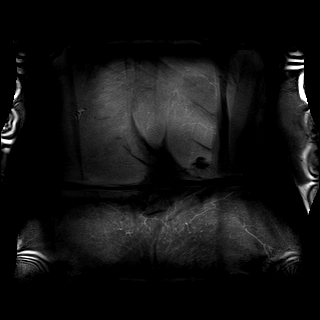

[Series 29: T2 · axial · 6.0mm · 1.56mm/px · 1 of 38 slices shown (2 of 2)]
[im 1/38]
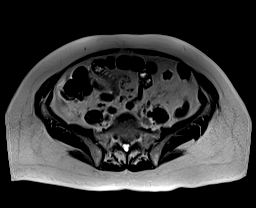

[Series 33: T1 dynamic · axial · 3.0mm · 1.25mm/px · z∈[-87,+174]mm · 3 of 88 slices shown (9 of 10)]
[im 1/88]
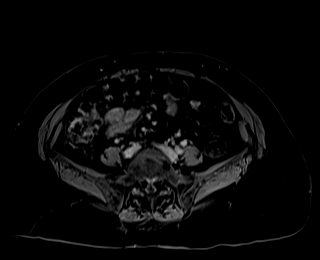
[im 44/88]
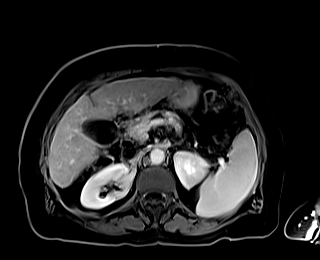
[im 88/88]
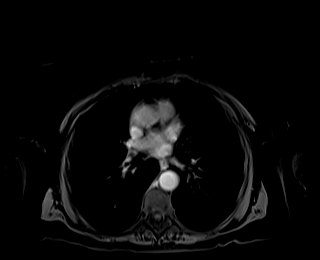

[Series 34: T1 dynamic · axial · 3.0mm · 1.25mm/px · z∈[-87,+174]mm · 3 of 88 slices shown (10 of 10)]
[im 1/88]
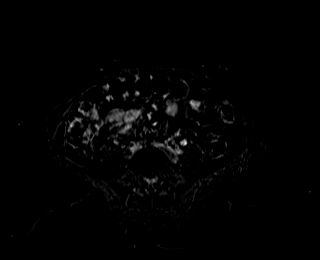
[im 44/88]
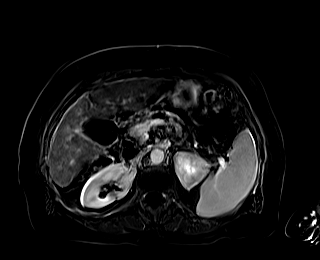
[im 88/88]
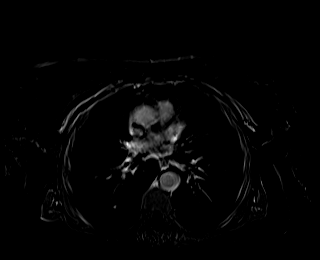

[45 of 48 positions shown; findings below may reference images not displayed]

FINDINGS: Lower chest: No acute findings.

Hepatobiliary: Hepatic cirrhosis is demonstrated, however no hepatic
masses are identified. Gallstones are seen measuring up to 2.4 cm,
however there is no evidence of cholecystitis or biliary ductal
dilatation.

Pancreas: No evidence of pancreatic mass, acute pancreatitis, or
pancreatic ductal dilatation. The pancreas shows diffuse atrophy and
there is a beaded appearance of the pancreatic duct, suggesting
chronic pancreatitis.

Spleen: Mild splenomegaly, consistent with portal venous
hypertension. No splenic masses identified.

Adrenals/Urinary Tract: No masses identified. Tiny sub-cm left renal
cyst noted. No evidence of hydronephrosis.

Stomach/Bowel: Visualized portion unremarkable.

Vascular/Lymphatic: No pathologically enlarged lymph nodes
identified. No acute vascular findings. Mild portosystemic venous
collaterals and esophageal varices, consistent with portal venous
hypertension.

Other:  No evidence of abdominal ascites.

Musculoskeletal:  No suspicious bone lesions identified.
IMPRESSION: No acute findings.

Hepatic cirrhosis. No evidence of hepatic neoplasm.

Findings of portal venous hypertension including mild splenomegaly,
portosystemic venous collaterals, and esophageal varices.

Cholelithiasis. No radiographic evidence of cholecystitis or biliary
ductal dilatation.

## 2021-02-11 MED ORDER — GADOBUTROL 1 MMOL/ML IV SOLN
7.0000 mL | Freq: Once | INTRAVENOUS | Status: AC | PRN
  Administered 2021-02-11: 10:00:00 7 mL via INTRAVENOUS

## 2021-02-18 DIAGNOSIS — K5900 Constipation, unspecified: Secondary | ICD-10-CM | POA: Diagnosis not present

## 2021-02-18 DIAGNOSIS — K8020 Calculus of gallbladder without cholecystitis without obstruction: Secondary | ICD-10-CM | POA: Diagnosis not present

## 2021-02-18 DIAGNOSIS — R1011 Right upper quadrant pain: Secondary | ICD-10-CM | POA: Diagnosis not present

## 2021-02-18 DIAGNOSIS — K7460 Unspecified cirrhosis of liver: Secondary | ICD-10-CM | POA: Diagnosis not present

## 2021-02-18 DIAGNOSIS — R109 Unspecified abdominal pain: Secondary | ICD-10-CM | POA: Diagnosis not present

## 2021-02-18 DIAGNOSIS — K7581 Nonalcoholic steatohepatitis (NASH): Secondary | ICD-10-CM | POA: Diagnosis not present

## 2021-03-11 DIAGNOSIS — Z20822 Contact with and (suspected) exposure to covid-19: Secondary | ICD-10-CM | POA: Diagnosis not present

## 2021-03-18 DIAGNOSIS — D649 Anemia, unspecified: Secondary | ICD-10-CM | POA: Diagnosis not present

## 2021-03-18 DIAGNOSIS — K8020 Calculus of gallbladder without cholecystitis without obstruction: Secondary | ICD-10-CM | POA: Diagnosis not present

## 2021-03-18 DIAGNOSIS — K7460 Unspecified cirrhosis of liver: Secondary | ICD-10-CM | POA: Diagnosis not present

## 2021-03-18 DIAGNOSIS — D696 Thrombocytopenia, unspecified: Secondary | ICD-10-CM | POA: Diagnosis not present

## 2021-03-19 DIAGNOSIS — D649 Anemia, unspecified: Secondary | ICD-10-CM | POA: Diagnosis not present

## 2021-05-04 DIAGNOSIS — R7989 Other specified abnormal findings of blood chemistry: Secondary | ICD-10-CM | POA: Diagnosis not present

## 2021-05-07 ENCOUNTER — Encounter: Payer: Self-pay | Admitting: *Deleted

## 2021-05-07 NOTE — Progress Notes (Signed)
Referral received and chart under review for appropriate scheduling

## 2021-05-08 ENCOUNTER — Other Ambulatory Visit: Payer: Self-pay | Admitting: Internal Medicine

## 2021-05-08 DIAGNOSIS — Z1231 Encounter for screening mammogram for malignant neoplasm of breast: Secondary | ICD-10-CM

## 2021-05-11 ENCOUNTER — Other Ambulatory Visit: Payer: Self-pay | Admitting: *Deleted

## 2021-05-11 DIAGNOSIS — Z23 Encounter for immunization: Secondary | ICD-10-CM | POA: Diagnosis not present

## 2021-05-11 DIAGNOSIS — D61818 Other pancytopenia: Secondary | ICD-10-CM

## 2021-05-11 NOTE — Progress Notes (Signed)
New Patient appt made for 10/18 with labs on 10/14.

## 2021-05-29 ENCOUNTER — Inpatient Hospital Stay: Payer: Medicare Other | Attending: Oncology

## 2021-05-29 ENCOUNTER — Other Ambulatory Visit: Payer: Self-pay

## 2021-05-29 DIAGNOSIS — D61818 Other pancytopenia: Secondary | ICD-10-CM | POA: Insufficient documentation

## 2021-05-29 LAB — CBC WITH DIFFERENTIAL (CANCER CENTER ONLY)
Abs Immature Granulocytes: 0.01 10*3/uL (ref 0.00–0.07)
Basophils Absolute: 0 10*3/uL (ref 0.0–0.1)
Basophils Relative: 1 %
Eosinophils Absolute: 0.1 10*3/uL (ref 0.0–0.5)
Eosinophils Relative: 2 %
HCT: 35.7 % — ABNORMAL LOW (ref 36.0–46.0)
Hemoglobin: 11.7 g/dL — ABNORMAL LOW (ref 12.0–15.0)
Immature Granulocytes: 0 %
Lymphocytes Relative: 28 %
Lymphs Abs: 1 10*3/uL (ref 0.7–4.0)
MCH: 28.7 pg (ref 26.0–34.0)
MCHC: 32.8 g/dL (ref 30.0–36.0)
MCV: 87.5 fL (ref 80.0–100.0)
Monocytes Absolute: 0.3 10*3/uL (ref 0.1–1.0)
Monocytes Relative: 8 %
Neutro Abs: 2.2 10*3/uL (ref 1.7–7.7)
Neutrophils Relative %: 61 %
Platelet Count: 65 10*3/uL — ABNORMAL LOW (ref 150–400)
RBC: 4.08 MIL/uL (ref 3.87–5.11)
RDW: 13.7 % (ref 11.5–15.5)
WBC Count: 3.6 10*3/uL — ABNORMAL LOW (ref 4.0–10.5)
nRBC: 0 % (ref 0.0–0.2)

## 2021-05-29 LAB — SAVE SMEAR(SSMR), FOR PROVIDER SLIDE REVIEW

## 2021-06-01 NOTE — Progress Notes (Signed)
New Hematology/Oncology Consult   Requesting MD: Dr. Josetta Huddle  [phone]  Reason for Consult: Pancytopenia  HPI: Ms. Schmit has been referred for evaluation of pancytopenia.  Most recent labs from 05/04/2021 show hemoglobin 11.0, MCV 86, total white count 3.6, platelet count 65,000.  Review of other labs dating to 12/21/2019 show hemoglobin 12.4, MCV 88.3, total white count 3.8 and platelet count 89,000.  Hepatic panel from 02/18/2021 shows a mildly elevated total bilirubin at 1.4; ferritin 18.  She had labs done in our office on 05/29/2021-hemoglobin 11.7, white count 3.6, platelet count 65,000.  Past medical history significant for cirrhosis.   Past Medical History:  Diagnosis Date   Cirrhosis ([place])    Colon adenoma 2007   Coronary artery disease    GERD (gastroesophageal reflux disease)    Humeral head fracture    Hypercholesteremia    Hypertension    Osteopenia    Positive PPD    Varicose veins    Wrist fracture   :  History reviewed. No pertinent surgical history.:   Current Outpatient Medications:    Docusate Sodium (COLACE PO), Take 3 capsules by mouth at bedtime.  , Disp: , Rfl:    estrogens, conjugated, (PREMARIN) 0.625 MG tablet, Take 0.625 mg by mouth daily. , Disp: , Rfl:    hydrochlorothiazide (HYDRODIURIL) 25 MG tablet, Take 25 mg by mouth daily.  , Disp: , Rfl:    loratadine (CLARITIN) 10 MG tablet, Take 10 mg by mouth daily.  , Disp: , Rfl:    Calcium-Magnesium-Vitamin D 600-40-500 MG-MG-UNIT TB24, Take 1 tablet by mouth daily.   (Patient not taking: Reported on 06/02/2021), Disp: , Rfl:    estradiol (ESTRACE) 0.5 MG tablet, Take 0.5 mg by mouth daily., Disp: , Rfl:    fish oil-omega-3 fatty acids 1000 MG capsule, Take 1 g by mouth daily.   (Patient not taking: Reported on 06/02/2021), Disp: , Rfl:    omeprazole (PRILOSEC) 20 MG capsule, Take 20 mg by mouth daily.  , Disp: , Rfl:    pantoprazole (PROTONIX) 20 MG tablet, Take 20 mg by mouth daily.  (Patient not taking: Reported on 06/02/2021), Disp: , Rfl:    simvastatin (ZOCOR) 20 MG tablet, Take 20 mg by mouth daily.  (Patient not taking: Reported on 06/02/2021), Disp: , Rfl: :    Allergies  Allergen Reactions   Penicillins Anaphylaxis   Claritin [Loratadine]    Codeine Sulfate Other (See Comments)    unknown   Diphenhydramine Hcl Other (See Comments)    Suspected allergy due to other symptoms worsening   Iohexol Hives    Patient breaks out in whelps and needs 13 hour prep //rls   Oxycodone Other (See Comments)    unknown   Percocet [Oxycodone-Acetaminophen] Other (See Comments)    unknown   Azithromycin Other (See Comments)    Knots on the skin   Methocarbamol Rash   Vancomycin Rash    "red man disease" (needs to be given slower)    FH: Father deceased with leukemia in his 16s, brother deceased with leukemia in his 97s.  SOCIAL HISTORY: She lives in [place].  She is married.  Her husband has dementia.  She has 2 sons.  She is retired from [employer].  No tobacco or alcohol use.  Review of Systems: No fevers or sweats.  Recent loss of appetite.  No weight loss.  She denies pain except at the right upper chest/shoulder.  No bleeding except with toothbrushing.  No nausea  or vomiting.  No hematemesis.  No change in bowel habits.  No bloody or black bowel movements.  Mild dyspnea on exertion.  No cough.  No skin changes.  No rash.  Physical Exam:  Blood pressure 140/64, pulse 86, temperature 97.8 F (36.6 C), resp. rate 18, height 5\' 2"  (1.575 m), weight 163 lb 3.2 oz (74 kg), SpO2 98 %.  HEENT: Sclera anicteric.  No thrush or ulcers. Lungs: Lungs clear bilaterally. Cardiac: Regular rate and rhythm. Abdomen: No hepatosplenomegaly. Vascular: No significant leg edema. Lymph nodes: No palpable cervical, supraclavicular, axillary or inguinal lymph nodes. Neurologic: Alert and oriented. Skin: No rash.  LABS: 05/29/2021-hemoglobin 11.7, white count 3.6,  platelet count 65,000; 02/18/2021 ferritin 18, total bilirubin 1.4, INR 1.1; peripheral blood smear-moderate ovalocytes, rare teardrop and helmet cells, no increase in polychromasia; mild decrease in platelets, no clumps; normal white blood cells.  RADIOLOGY: 02/11/2021 MRI abdomen-hepatic cirrhosis, no hepatic masses.  Pancreas shows diffuse atrophy and beaded appearance of the pancreatic duct.  Mild splenomegaly.  No splenic masses.  Mild portosystemic venous collaterals and esophageal varices.  No abdominal ascites.  Assessment and Plan:   Pancytopenia Cirrhosis Mild splenomegaly on MRI 02/11/2021 Mild portosystemic venous collaterals and esophageal varices on MRI 02/11/2021 02/18/2021 ferritin 18  Ms. Mcclane has been referred for evaluation of pancytopenia.  Dr. Benay Spice feels this is likely due to cirrhosis/effects of portal hypertension.  She had mild splenomegaly and esophageal varices on an MRI earlier this year.  We recommend she follow-up with gastroenterology for management of cirrhosis and evaluation of the low ferritin level.  She will return for a CBC and follow-up visit in 6 months.  We are available to see her sooner if needed.  Patient seen with Dr. Benay Spice.    Ned Card, NP 06/02/2021, 12:12 PM   This was a shared visit with Ned Card.  Ms. Choy was interviewed and examined.  I reviewed the peripheral blood smear.  She is referred for evaluation of pancytopenia.  The pancytopenia is very likely secondary to cirrhosis with liver dysfunction and hypersplenism contributing to the cytopenias.  The anemia may be in part related to bleeding from portal hypertension.  She should follow-up with gastroenterology for management of cirrhosis and to evaluate for bleeding.  I was present for greater than 50% of today's visit.  I performed medical decision making.  Julieanne Manson, MD

## 2021-06-02 ENCOUNTER — Encounter: Payer: Self-pay | Admitting: Nurse Practitioner

## 2021-06-02 ENCOUNTER — Other Ambulatory Visit: Payer: Self-pay

## 2021-06-02 ENCOUNTER — Inpatient Hospital Stay (HOSPITAL_BASED_OUTPATIENT_CLINIC_OR_DEPARTMENT_OTHER): Payer: Medicare Other | Admitting: Nurse Practitioner

## 2021-06-02 VITALS — BP 140/64 | HR 86 | Temp 97.8°F | Resp 18 | Ht 62.0 in | Wt 163.2 lb

## 2021-06-02 DIAGNOSIS — D61818 Other pancytopenia: Secondary | ICD-10-CM

## 2021-06-16 DIAGNOSIS — Z961 Presence of intraocular lens: Secondary | ICD-10-CM | POA: Diagnosis not present

## 2021-06-17 ENCOUNTER — Other Ambulatory Visit: Payer: Self-pay

## 2021-06-17 ENCOUNTER — Ambulatory Visit
Admission: RE | Admit: 2021-06-17 | Discharge: 2021-06-17 | Disposition: A | Discharge status: Home / Self Care | Payer: Medicare Other | Source: Ambulatory Visit | Attending: Internal Medicine | Admitting: Internal Medicine

## 2021-06-17 DIAGNOSIS — Z1231 Encounter for screening mammogram for malignant neoplasm of breast: Secondary | ICD-10-CM | POA: Diagnosis not present

## 2021-06-24 DIAGNOSIS — D696 Thrombocytopenia, unspecified: Secondary | ICD-10-CM | POA: Diagnosis not present

## 2021-06-24 DIAGNOSIS — K8020 Calculus of gallbladder without cholecystitis without obstruction: Secondary | ICD-10-CM | POA: Diagnosis not present

## 2021-06-24 DIAGNOSIS — K7460 Unspecified cirrhosis of liver: Secondary | ICD-10-CM | POA: Diagnosis not present

## 2021-06-24 DIAGNOSIS — K7581 Nonalcoholic steatohepatitis (NASH): Secondary | ICD-10-CM | POA: Diagnosis not present

## 2021-06-24 DIAGNOSIS — D61818 Other pancytopenia: Secondary | ICD-10-CM | POA: Diagnosis not present

## 2021-06-30 DIAGNOSIS — B354 Tinea corporis: Secondary | ICD-10-CM | POA: Diagnosis not present

## 2021-06-30 DIAGNOSIS — E1140 Type 2 diabetes mellitus with diabetic neuropathy, unspecified: Secondary | ICD-10-CM | POA: Diagnosis not present

## 2021-06-30 DIAGNOSIS — Z7984 Long term (current) use of oral hypoglycemic drugs: Secondary | ICD-10-CM | POA: Diagnosis not present

## 2021-06-30 DIAGNOSIS — E1165 Type 2 diabetes mellitus with hyperglycemia: Secondary | ICD-10-CM | POA: Diagnosis not present

## 2021-06-30 DIAGNOSIS — L298 Other pruritus: Secondary | ICD-10-CM | POA: Diagnosis not present

## 2021-07-30 DIAGNOSIS — R051 Acute cough: Secondary | ICD-10-CM | POA: Diagnosis not present

## 2021-07-30 DIAGNOSIS — Z03818 Encounter for observation for suspected exposure to other biological agents ruled out: Secondary | ICD-10-CM | POA: Diagnosis not present

## 2021-08-03 DIAGNOSIS — E1140 Type 2 diabetes mellitus with diabetic neuropathy, unspecified: Secondary | ICD-10-CM | POA: Diagnosis not present

## 2021-08-03 DIAGNOSIS — D61818 Other pancytopenia: Secondary | ICD-10-CM | POA: Diagnosis not present

## 2021-08-03 DIAGNOSIS — K7460 Unspecified cirrhosis of liver: Secondary | ICD-10-CM | POA: Diagnosis not present

## 2021-08-03 DIAGNOSIS — B354 Tinea corporis: Secondary | ICD-10-CM | POA: Diagnosis not present

## 2021-08-03 DIAGNOSIS — L298 Other pruritus: Secondary | ICD-10-CM | POA: Diagnosis not present

## 2021-08-03 DIAGNOSIS — K7581 Nonalcoholic steatohepatitis (NASH): Secondary | ICD-10-CM | POA: Diagnosis not present

## 2021-08-03 DIAGNOSIS — E1165 Type 2 diabetes mellitus with hyperglycemia: Secondary | ICD-10-CM | POA: Diagnosis not present

## 2021-08-03 DIAGNOSIS — R051 Acute cough: Secondary | ICD-10-CM | POA: Diagnosis not present

## 2021-08-06 ENCOUNTER — Encounter: Payer: Medicare Other | Attending: Internal Medicine | Admitting: Dietician

## 2021-08-06 ENCOUNTER — Encounter: Payer: Self-pay | Admitting: Dietician

## 2021-08-06 ENCOUNTER — Other Ambulatory Visit: Payer: Self-pay

## 2021-08-06 DIAGNOSIS — E118 Type 2 diabetes mellitus with unspecified complications: Secondary | ICD-10-CM | POA: Insufficient documentation

## 2021-08-06 NOTE — Progress Notes (Signed)
Diabetes Self-Management Education  Visit Type: First/Initial  Appt. Start Time: 0930 Appt. End Time: 1100  08/06/2021  [name], identified by name and date of birth, is a 87 y.o. female with a diagnosis of Diabetes: Type 2.   ASSESSMENT Patient is here today with her daughter-in-law Sheila Hanson for education for newly diagnosed diabetes.  History includes Type 2 diabetes (06/2021), neuropathy, COVID 02/2021, NASH, GERD, HTN, HLD Alb 8.4% 08/03/2021 decreased from 11.1% 06/30/2021 Medication includes Metformin, vitamin D  Weight hx:   159 lbs 08/06/21 (154 lbs at home this am) 160 lbs 06/2021 at home Blood glucose meter provided: Accu-Chek Guide Me Lot [id number] Expiration 09/25/2022 Glucose 161 (2 PB crackers for breakfast)   Patient lives with her husband.  She is his caretaker.  He has Louie Body dementia.  She drives.  Her husband has an aide for part of the day.  She is retired from the [employer].  Her 3 sons and their wives live on the same block. States that she has felt sick since 11/2020.  Increased stress.  Not sleeping well - "afraid my husband will die in bed with me".  Height 5\' 2"  (1.575 m), weight 159 lb (72.1 kg). Body mass index is 29.08 kg/m.   Diabetes Self-Management Education - 08/06/21 0952       Visit Information   Visit Type First/Initial      Initial Visit   Diabetes Type Type 2    Are you currently following a meal plan? No    Are you taking your medications as prescribed? Yes    Date Diagnosed 06/2021      Health Coping   How would you rate your overall health? Good      Psychosocial Assessment   Patient Belief/Attitude about Diabetes Other (comment)   surprised, dismayed, anxious, MAD   Self-care barriers None    Self-management support Doctor's office;Family    Other persons present Patient;Family Member    Patient Concerns Nutrition/Meal planning;Glycemic Control    Special Needs None    Preferred Learning Style No preference indicated     Learning Readiness Ready    How often do you need to have someone help you when you read instructions, pamphlets, or other written materials from your doctor or pharmacy? 1 - Never    What is the last grade level you completed in school? 14      Pre-Education Assessment   Patient understands the diabetes disease and treatment process. Needs Instruction    Patient understands incorporating nutritional management into lifestyle. Needs Instruction    Patient undertands incorporating physical activity into lifestyle. Needs Instruction    Patient understands using medications safely. Needs Instruction    Patient understands monitoring blood glucose, interpreting and using results Needs Instruction    Patient understands prevention, detection, and treatment of acute complications. Needs Instruction    Patient understands prevention, detection, and treatment of chronic complications. Needs Instruction    Patient understands how to develop strategies to address psychosocial issues. Needs Instruction    Patient understands how to develop strategies to promote health/change behavior. Needs Instruction      Complications   Last HgB A1C per patient/outside source 8.4 %   08/03/21 decreased from 11.1% on 06/30/2021   How often do you check your blood sugar? 0 times/day (not testing)    Have you had a dilated eye exam in the past 12 months? Yes    Have you had a dental exam in the past  12 months? Yes    Are you checking your feet? Yes    How many days per week are you checking your feet? 5      Dietary Intake   Breakfast Granola or cornflakes with splenda or fiber one with 2% milk OR occasional oatmeal OR 2 boiled eggs and 2 slices of [employer] toast OR activia yogurt   9 am   Snack (morning) none    Lunch skips at times OR leftovers OR half of a banana or ham or cheese sandwich OR soup AND greens    Snack (afternoon) occasional peanuts    Dinner greens or other nonstarchy vegetables, skinless chicken  (baked), baked sweet potato    Snack (evening) none    Beverage(s) water, diet Pepsi, crystal lite tea      Exercise   Exercise Type ADL's      Patient Education   Previous Diabetes Education No    Disease state  Definition of diabetes, type 1 and 2, and the diagnosis of diabetes;Factors that contribute to the development of diabetes    Nutrition management  Role of diet in the treatment of diabetes and the relationship between the three main macronutrients and blood glucose level;Food label reading, portion sizes and measuring food.;Meal options for control of blood glucose level and chronic complications.;Meal timing in regards to the patients' current diabetes medication.    Physical activity and exercise  Role of exercise on diabetes management, blood pressure control and cardiac health.;Helped patient identify appropriate exercises in relation to his/her diabetes, diabetes complications and other health issue.    Medications Reviewed patients medication for diabetes, action, purpose, timing of dose and side effects.    Monitoring Taught/evaluated SMBG meter.;Purpose and frequency of SMBG.;Taught/discussed recording of test results and interpretation of SMBG.;Identified appropriate SMBG and/or A1C goals.;Daily foot exams;Yearly dilated eye exam    Acute complications Taught treatment of hypoglycemia - the 15 rule.;Discussed and identified patients' treatment of hyperglycemia.    Chronic complications Relationship between chronic complications and blood glucose control;Retinopathy and reason for yearly dilated eye exams    Psychosocial adjustment Worked with patient to identify barriers to care and solutions;Role of stress on diabetes;Identified and addressed patients feelings and concerns about diabetes    Personal strategies to promote health Lifestyle issues that need to be addressed for better diabetes care      Individualized Goals (developed by patient)   Nutrition General guidelines for  healthy choices and portions discussed    Physical Activity Exercise 3-5 times per week;15 minutes per day    Medications take my medication as prescribed    Monitoring  test my blood glucose as discussed    Reducing Risk examine blood glucose patterns;increase portions of healthy fats      Post-Education Assessment   Patient understands the diabetes disease and treatment process. Demonstrates understanding / competency    Patient understands incorporating nutritional management into lifestyle. Needs Review    Patient undertands incorporating physical activity into lifestyle. Demonstrates understanding / competency    Patient understands using medications safely. Demonstrates understanding / competency    Patient understands monitoring blood glucose, interpreting and using results Demonstrates understanding / competency    Patient understands prevention, detection, and treatment of acute complications. Demonstrates understanding / competency    Patient understands prevention, detection, and treatment of chronic complications. Demonstrates understanding / competency    Patient understands how to develop strategies to address psychosocial issues. Needs Review    Patient understands how to develop strategies  to promote health/change behavior. Needs Review      Outcomes   Expected Outcomes Demonstrated interest in learning. Expect positive outcomes    Future DMSE 2 months    Program Status Not Completed             Individualized Plan for Diabetes Self-Management Training:   Learning Objective:  Patient will have a greater understanding of diabetes self-management. Patient education plan is to attend individual and/or group sessions per assessed needs and concerns.   Plan:   Patient Instructions  Consider 600 mg ALA (alpha lipoic acid) if you continue to have neuropathy pain.  Be active.  Consider walking 10 minutes after a meal and increasing slowly.  Continue to be mindful of  your food choices. 1/2 your plate should be non-starchy vegetables Small amount of protein with meals 1/4 plate starch or other carbohydrate  Expected Outcomes:  Demonstrated interest in learning. Expect positive outcomes  Education material provided: ADA - How to Thrive: A Guide for Your Journey with Diabetes, Food label handouts, Snack sheet, and Diabetes Resources; Heart Healthy Consistent Carbohydrate Nutrition Therapy from [employer] (wanted a list of foods best to choose and not choose), and Carbohydrate Counting with Diabetes from [employer] (meal planning tips)  If problems or questions, patient to contact team via:  Phone  Future DSME appointment: 2 months

## 2021-08-06 NOTE — Patient Instructions (Addendum)
Consider 600 mg ALA (alpha lipoic acid) if you continue to have neuropathy pain.  Be active.  Consider walking 10 minutes after a meal and increasing slowly.  Continue to be mindful of your food choices. 1/2 your plate should be non-starchy vegetables Small amount of protein with meals 1/4 plate starch or other carbohydrate

## 2021-08-28 ENCOUNTER — Ambulatory Visit: Payer: Medicare Other | Admitting: Registered"

## 2021-10-08 ENCOUNTER — Other Ambulatory Visit: Payer: Self-pay

## 2021-10-08 ENCOUNTER — Encounter: Payer: Medicare Other | Attending: Internal Medicine | Admitting: Dietician

## 2021-10-08 DIAGNOSIS — E119 Type 2 diabetes mellitus without complications: Secondary | ICD-10-CM | POA: Diagnosis not present

## 2021-10-08 NOTE — Patient Instructions (Signed)
Consider rotating your blood sugar test  Before breakfast or 2 hours after you start dinner. Blood glucose goals  80-130 first thing in the morning (fasting)  Less than 180 2 hours after you start a meal.

## 2021-10-08 NOTE — Progress Notes (Signed)
Diabetes Self-Management Education  Visit Type: Follow-up  Appt. Start Time: 1050 Appt. End Time: 1115  10/12/2021  [name], identified by name and date of birth, is a 88 y.o. female with a diagnosis of Diabetes: Type 2.   ASSESSMENT Patient is here today alone.  She was last seen by this RD on 08/06/2021. She has lost 13 lbs since 06/2021. She states energy is poor and has high stress Snacks on peanuts and tries to avoid snacking overall Glucose 116 this am and usual fasting glucose is 106-128.  She is caring for her husband with Lewy body dementia Her neighbors bring food which is helpful but may not always fit her needs.  History includes Type 2 diabetes (06/2021), neuropathy, COVID 02/2021, NASH, GERD, HTN, HLD Alb 8.4% 08/03/2021 decreased from 11.1% 06/30/2021 Medication includes Metformin, vitamin D   Weight hx:   151 lbs in office 2/243/2023 (147 lbs at home) 159 lbs 08/06/21 (154 lbs at home this am) 160 lbs 06/2021 at home Blood glucose meter provided: Accu-Chek Guide Me Lot [id number] Expiration 09/25/2022 Glucose 161 (2 PB crackers for breakfast)   Patient lives with her husband.  She is his caretaker.  He has Louie Body dementia.  She drives.  Her husband has an aide for part of the day.  She is retired from the [employer].  Her 3 sons and their wives live on the same block. States that she has felt sick since 11/2020.  Increased stress.  Not sleeping well - "afraid my husband will die in bed with me".   There were no vitals taken for this visit. There is no height or weight on file to calculate BMI.   Diabetes Self-Management Education - 10/12/21 1652       Visit Information   Visit Type Follow-up      Initial Visit   Diabetes Type Type 2    Are you taking your medications as prescribed? Yes      Psychosocial Assessment   Self-care barriers Other (comment)   increased stress   Self-management support Doctor's office;Family    Other persons present  Patient    Patient Concerns Nutrition/Meal planning    Special Needs None      Pre-Education Assessment   Patient understands the diabetes disease and treatment process. Demonstrates understanding / competency    Patient understands incorporating nutritional management into lifestyle. Needs Review    Patient undertands incorporating physical activity into lifestyle. Demonstrates understanding / competency    Patient understands using medications safely. Demonstrates understanding / competency    Patient understands monitoring blood glucose, interpreting and using results Demonstrates understanding / competency    Patient understands prevention, detection, and treatment of acute complications. Demonstrates understanding / competency    Patient understands prevention, detection, and treatment of chronic complications. Demonstrates understanding / competency    Patient understands how to develop strategies to address psychosocial issues. Demonstrates understanding / competency    Patient understands how to develop strategies to promote health/change behavior. Needs Review      Complications   How often do you check your blood sugar? 1-2 times/day    Fasting Blood glucose range (mg/dL) 70-129      Dietary Intake   Breakfast Fiber One Cereal, 2% milk with splenda, blueberries    Lunch leftovers or sandwich on [employer], occasional fruit    Snack (afternoon) peanuts, occasional cheese crackers    Dinner pasta, greens OR chicken and rice soup    Beverage(s) water, diet  Pepsi, crystal lite tea      Exercise   Exercise Type ADL's      Patient Education   Previous Diabetes Education Yes (please comment)   07/2021   Nutrition management  Meal options for control of blood glucose level and chronic complications.    Monitoring Identified appropriate SMBG and/or A1C goals.      Individualized Goals (developed by patient)   Nutrition General guidelines for healthy choices and portions discussed     Physical Activity Exercise 3-5 times per week;15 minutes per day    Medications take my medication as prescribed    Monitoring  test my blood glucose as discussed      Patient Self-Evaluation of Goals - Patient rates self as meeting previously set goals (% of time)   Nutrition >75%    Physical Activity >75%    Medications >75%    Monitoring >75%    Problem Solving >75%    Reducing Risk >75%    Health Coping >75%      Post-Education Assessment   Patient understands the diabetes disease and treatment process. Demonstrates understanding / competency    Patient understands incorporating nutritional management into lifestyle. Demonstrates understanding / competency    Patient undertands incorporating physical activity into lifestyle. Demonstrates understanding / competency    Patient understands using medications safely. Demonstrates understanding / competency    Patient understands monitoring blood glucose, interpreting and using results Demonstrates understanding / competency    Patient understands prevention, detection, and treatment of acute complications. Demonstrates understanding / competency    Patient understands prevention, detection, and treatment of chronic complications. Demonstrates understanding / competency    Patient understands how to develop strategies to address psychosocial issues. Demonstrates understanding / competency    Patient understands how to develop strategies to promote health/change behavior. Demonstrates understanding / competency      Outcomes   Expected Outcomes Demonstrated interest in learning. Expect positive outcomes    Future DMSE PRN    Program Status Completed      Subsequent Visit   Since your last visit have you continued or begun to take your medications as prescribed? Yes    Since your last visit have you experienced any weight changes? Loss    Weight Loss (lbs) 8    Since your last visit, are you checking your blood glucose at least once a  day? Yes             Individualized Plan for Diabetes Self-Management Training:   Learning Objective:  Patient will have a greater understanding of diabetes self-management. Patient education plan is to attend individual and/or group sessions per assessed needs and concerns.   Plan:   Patient Instructions  Consider rotating your blood sugar test  Before breakfast or 2 hours after you start dinner. Blood glucose goals  80-130 first thing in the morning (fasting)  Less than 180 2 hours after you start a meal.     Expected Outcomes:  Demonstrated interest in learning. Expect positive outcomes  Education material provided: Meal plan card and My Plate; breakfast ideas  If problems or questions, patient to contact team via:  Phone  Future DSME appointment: PRN

## 2021-10-12 ENCOUNTER — Encounter: Payer: Self-pay | Admitting: Dietician

## 2021-11-04 DIAGNOSIS — B354 Tinea corporis: Secondary | ICD-10-CM | POA: Diagnosis not present

## 2021-11-04 DIAGNOSIS — Z0000 Encounter for general adult medical examination without abnormal findings: Secondary | ICD-10-CM | POA: Diagnosis not present

## 2021-11-04 DIAGNOSIS — K7581 Nonalcoholic steatohepatitis (NASH): Secondary | ICD-10-CM | POA: Diagnosis not present

## 2021-11-04 DIAGNOSIS — L298 Other pruritus: Secondary | ICD-10-CM | POA: Diagnosis not present

## 2021-11-04 DIAGNOSIS — Z7189 Other specified counseling: Secondary | ICD-10-CM | POA: Diagnosis not present

## 2021-11-04 DIAGNOSIS — K7460 Unspecified cirrhosis of liver: Secondary | ICD-10-CM | POA: Diagnosis not present

## 2021-11-04 DIAGNOSIS — E1140 Type 2 diabetes mellitus with diabetic neuropathy, unspecified: Secondary | ICD-10-CM | POA: Diagnosis not present

## 2021-11-04 DIAGNOSIS — E1165 Type 2 diabetes mellitus with hyperglycemia: Secondary | ICD-10-CM | POA: Diagnosis not present

## 2021-11-04 DIAGNOSIS — Z1389 Encounter for screening for other disorder: Secondary | ICD-10-CM | POA: Diagnosis not present

## 2021-11-04 DIAGNOSIS — D61818 Other pancytopenia: Secondary | ICD-10-CM | POA: Diagnosis not present

## 2021-11-06 DIAGNOSIS — E1165 Type 2 diabetes mellitus with hyperglycemia: Secondary | ICD-10-CM | POA: Diagnosis not present

## 2021-12-01 ENCOUNTER — Inpatient Hospital Stay: Payer: Medicare Other | Attending: Oncology

## 2021-12-01 ENCOUNTER — Inpatient Hospital Stay (HOSPITAL_BASED_OUTPATIENT_CLINIC_OR_DEPARTMENT_OTHER): Payer: Medicare Other | Admitting: Oncology

## 2021-12-01 VITALS — BP 143/59 | HR 86 | Temp 98.2°F | Resp 18 | Ht 62.0 in | Wt 148.0 lb

## 2021-12-01 DIAGNOSIS — D61818 Other pancytopenia: Secondary | ICD-10-CM | POA: Insufficient documentation

## 2021-12-01 DIAGNOSIS — K7460 Unspecified cirrhosis of liver: Secondary | ICD-10-CM | POA: Diagnosis not present

## 2021-12-01 DIAGNOSIS — D508 Other iron deficiency anemias: Secondary | ICD-10-CM

## 2021-12-01 DIAGNOSIS — I8510 Secondary esophageal varices without bleeding: Secondary | ICD-10-CM | POA: Insufficient documentation

## 2021-12-01 LAB — CBC WITH DIFFERENTIAL (CANCER CENTER ONLY)
Abs Immature Granulocytes: 0 10*3/uL (ref 0.00–0.07)
Basophils Absolute: 0 10*3/uL (ref 0.0–0.1)
Basophils Relative: 1 %
Eosinophils Absolute: 0 10*3/uL (ref 0.0–0.5)
Eosinophils Relative: 1 %
HCT: 32.5 % — ABNORMAL LOW (ref 36.0–46.0)
Hemoglobin: 10.4 g/dL — ABNORMAL LOW (ref 12.0–15.0)
Immature Granulocytes: 0 %
Lymphocytes Relative: 29 %
Lymphs Abs: 0.7 10*3/uL (ref 0.7–4.0)
MCH: 28.2 pg (ref 26.0–34.0)
MCHC: 32 g/dL (ref 30.0–36.0)
MCV: 88.1 fL (ref 80.0–100.0)
Monocytes Absolute: 0.4 10*3/uL (ref 0.1–1.0)
Monocytes Relative: 15 %
Neutro Abs: 1.4 10*3/uL — ABNORMAL LOW (ref 1.7–7.7)
Neutrophils Relative %: 54 %
Platelet Count: 59 10*3/uL — ABNORMAL LOW (ref 150–400)
RBC: 3.69 MIL/uL — ABNORMAL LOW (ref 3.87–5.11)
RDW: 14.9 % (ref 11.5–15.5)
WBC Count: 2.5 10*3/uL — ABNORMAL LOW (ref 4.0–10.5)
nRBC: 0 % (ref 0.0–0.2)

## 2021-12-01 NOTE — Progress Notes (Signed)
?  [place] ?OFFICE PROGRESS NOTE ? ? ?Diagnosis: Pancytopenia ? ?INTERVAL HISTORY:  ? ?Sheila Hanson returns as scheduled.  She reports weight loss secondary to a change in her diet and diminished appetite.  No bleeding.  She is not taking iron.  She has not followed up with gastroenterology to evaluate for cirrhosis. ? ?Objective: ? ?Vital signs in last 24 hours: ? ?Blood pressure (!) 143/59, pulse 86, temperature 98.2 ?F (36.8 ?C), temperature source Oral, resp. rate 18, height '5\' 2"'$  (1.575 m), weight 148 lb (67.1 kg), SpO2 99 %. ?  ? ?Lymphatics: No cervical or supraclavicular nodes ?Resp: Lungs clear bilaterally ?Cardio: Regular rate and rhythm ?GI: No hepatosplenomegaly ?Vascular: No leg edema ? ? ?Lab Results: ? ?Lab Results  ?Component Value Date  ? WBC 2.5 (L) 12/01/2021  ? HGB 10.4 (L) 12/01/2021  ? HCT 32.5 (L) 12/01/2021  ? MCV 88.1 12/01/2021  ? PLT 59 (L) 12/01/2021  ? NEUTROABS 1.4 (L) 12/01/2021  ? ? ?CMP  ?Lab Results  ?Component Value Date  ? NA 134 (L) 05/15/2011  ? K 3.6 05/15/2011  ? CL 95 (L) 05/15/2011  ? CO2 26 05/15/2011  ? GLUCOSE 141 (H) 05/15/2011  ? BUN 15 05/15/2011  ? CREATININE 0.60 05/15/2011  ? CALCIUM 9.9 05/15/2011  ? GFRNONAA >60 05/15/2011  ? GFRAA >60 05/15/2011  ? ? ?Medications: I have reviewed the patient's current medications. ? ? ?Assessment/Plan: ?Pancytopenia ?Cirrhosis ?Mild splenomegaly on MRI 02/11/2021 ?Mild portosystemic venous collaterals and esophageal varices on MRI 02/11/2021 ?02/18/2021 ferritin 18 ? ? ? ?Disposition: ?Sheila Hanson has persistent pancytopenia.  The hemoglobin is lower today.  The ferritin level was low last year.  She will begin ferrous sulfate.  She will return for office and lab visit in 2 months. ? ?I recommend she follow-up with Sheila Hanson to confirm the diagnosis cirrhosis and recommend management. ? ?Sheila Coder, MD ? ?12/01/2021  ?12:33 PM ? ? ?

## 2021-12-23 ENCOUNTER — Other Ambulatory Visit: Payer: Self-pay | Admitting: Physician Assistant

## 2021-12-23 DIAGNOSIS — K7460 Unspecified cirrhosis of liver: Secondary | ICD-10-CM | POA: Diagnosis not present

## 2021-12-23 DIAGNOSIS — D61818 Other pancytopenia: Secondary | ICD-10-CM | POA: Diagnosis not present

## 2021-12-23 DIAGNOSIS — R609 Edema, unspecified: Secondary | ICD-10-CM | POA: Diagnosis not present

## 2021-12-23 DIAGNOSIS — K7581 Nonalcoholic steatohepatitis (NASH): Secondary | ICD-10-CM | POA: Diagnosis not present

## 2021-12-23 DIAGNOSIS — I8500 Esophageal varices without bleeding: Secondary | ICD-10-CM | POA: Diagnosis not present

## 2021-12-23 DIAGNOSIS — K7290 Hepatic failure, unspecified without coma: Secondary | ICD-10-CM | POA: Diagnosis not present

## 2022-01-06 ENCOUNTER — Encounter: Payer: Self-pay | Admitting: Podiatry

## 2022-01-06 ENCOUNTER — Ambulatory Visit (INDEPENDENT_AMBULATORY_CARE_PROVIDER_SITE_OTHER): Payer: Medicare Other | Admitting: Podiatry

## 2022-01-06 DIAGNOSIS — E1159 Type 2 diabetes mellitus with other circulatory complications: Secondary | ICD-10-CM | POA: Diagnosis not present

## 2022-01-06 DIAGNOSIS — B351 Tinea unguium: Secondary | ICD-10-CM | POA: Diagnosis not present

## 2022-01-06 DIAGNOSIS — M79675 Pain in left toe(s): Secondary | ICD-10-CM | POA: Diagnosis not present

## 2022-01-06 DIAGNOSIS — M79674 Pain in right toe(s): Secondary | ICD-10-CM

## 2022-01-06 NOTE — Progress Notes (Signed)
This patient presents to the office with chief complaint of long thick nails and diabetic feet.  This patient  says there  is  no pain and discomfort in her feet.  This patient says there are long thick painful nails.  These nails are painful walking and wearing shoes.  Patient has no history of infection or drainage from both feet.  Patient is unable to  self treat his own nails .This patient has cancer in her liver which has caused diabetes. This patient presents  to the office today for treatment of the  long nails and a foot evaluation due to history of  diabetes.  General Appearance  Alert, conversant and in no acute stress.  Vascular  Dorsalis pedis and posterior tibial  pulses are weakly palpable  right.  Dorsalis pedis and posterior tibial pulses are absent.    Capillary return is within normal limits  bilaterally. Temperature is within normal limits  bilaterally.  Neurologic  Senn-Weinstein monofilament wire test within normal limits  bilaterally. Muscle power within normal limits bilaterally.  Nails Thick disfigured discolored nails with subungual debris  from hallux to fifth toes bilaterally. No evidence of bacterial infection or drainage bilaterally.  Orthopedic  No limitations of motion of motion feet .  No crepitus or effusions noted.  No bony pathology or digital deformities noted. Midfoot DJD  B/L.  Skin  normotropic skin with no porokeratosis noted bilaterally.  No signs of infections or ulcers noted.     Onychomycosis  Diabetes with vascular pathology.  IE  Debride nails x 10.  A diabetic foot exam was performed and there is no evidence of any vascular pathology.  LOPS  WNL.   RTC 3 months.   Shamra Bradeen DPM   

## 2022-01-12 ENCOUNTER — Ambulatory Visit
Admission: RE | Admit: 2022-01-12 | Discharge: 2022-01-12 | Disposition: A | Discharge status: Home / Self Care | Payer: Medicare Other | Source: Ambulatory Visit | Attending: Physician Assistant | Admitting: Physician Assistant

## 2022-01-12 DIAGNOSIS — K8020 Calculus of gallbladder without cholecystitis without obstruction: Secondary | ICD-10-CM | POA: Diagnosis not present

## 2022-01-12 DIAGNOSIS — K7460 Unspecified cirrhosis of liver: Secondary | ICD-10-CM | POA: Diagnosis not present

## 2022-01-12 DIAGNOSIS — R161 Splenomegaly, not elsewhere classified: Secondary | ICD-10-CM | POA: Diagnosis not present

## 2022-01-12 DIAGNOSIS — K766 Portal hypertension: Secondary | ICD-10-CM | POA: Diagnosis not present

## 2022-01-12 IMAGING — MR MR ABDOMEN WO/W CM
11 of 19 series · 24 of 48 positions shown · IV contrast (13 ml Multihance)
Comparison: Multiple priors including most recent MRI abdomen MORIKI

CLINICAL DATA: Hepatic cirrhosis, HCC screen.

EXAM:
MRI ABDOMEN WITHOUT AND WITH CONTRAST
TECHNIQUE: Multiplanar multisequence MR imaging of the abdomen was performed
both before and after the administration of intravenous contrast.
CONTRAST:  13mL MULTIHANCE GADOBENATE DIMEGLUMINE 529 MG/ML IV SOLN

[Series 3: cor haste · coronal · 5.0mm · 0.70mm/px · 1 of 35 slices shown]
[im 1/35]
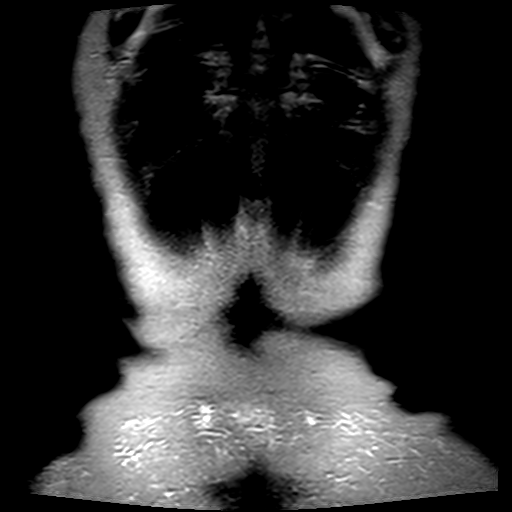

[Series 4: axial haste · axial · 6.0mm · 0.68mm/px · 1 of 37 slices shown]
[im 1/37]
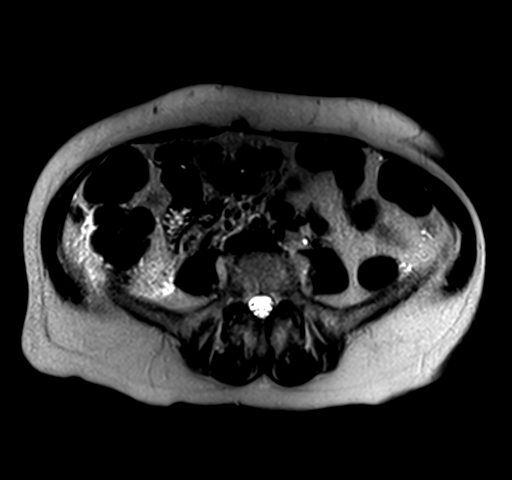

[Series 5: T1 · axial · 6.0mm · 0.68mm/px · z∈[-88,+150]mm · 3 of 74 slices shown]
[im 1/74]
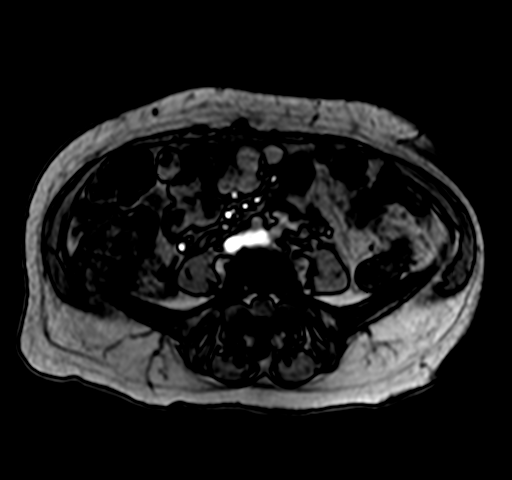
[im 37/74]
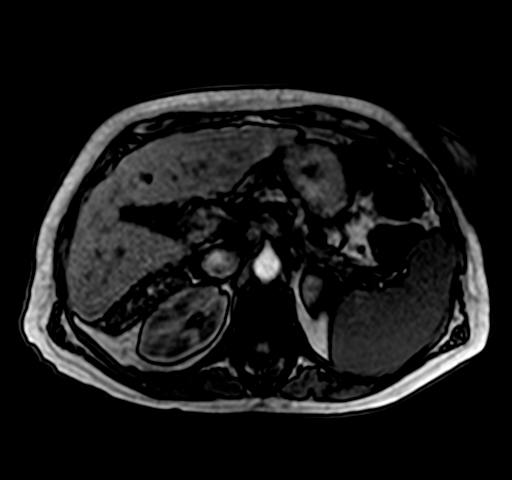
[im 74/74]
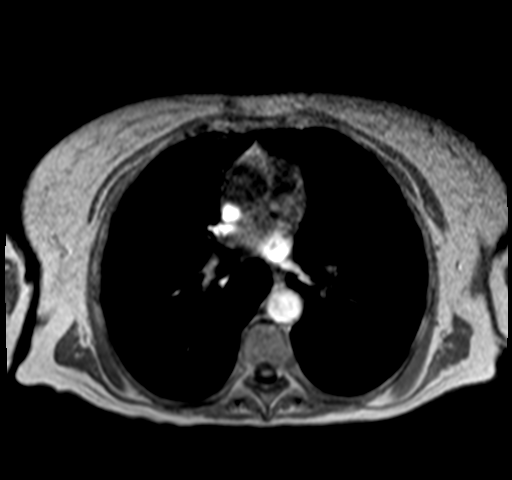

[Series 6: bSSFP · axial · 4.0mm · 0.68mm/px · z∈[-89,+151]mm · 2 of 61 slices shown]
[im 1/61]
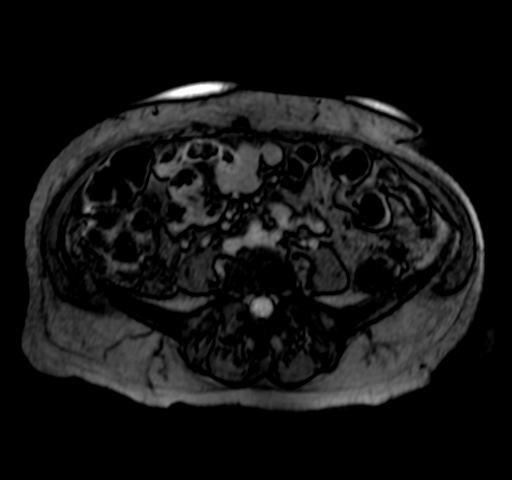
[im 61/61]
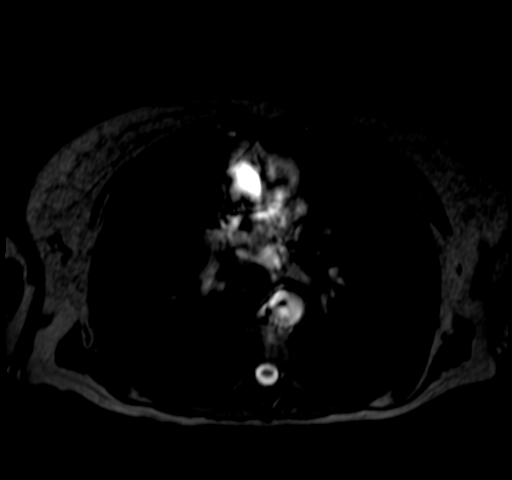

[Series 7: T2 fat-sat · axial · 6.0mm · 1.09mm/px · 1 of 38 slices shown]
[im 1/38]
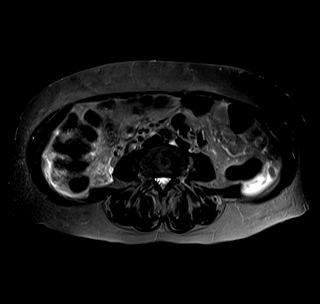

[Series 8: ep2d_diff_b50_500_800_p2_trig · axial · 6.0mm · 1.82mm/px · z∈[-67,+199]mm · 4 of 114 slices shown]
[im 1/114]
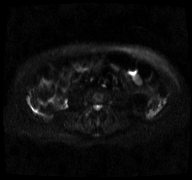
[im 38/114]
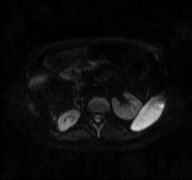
[im 76/114]
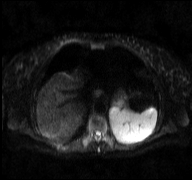
[im 114/114]
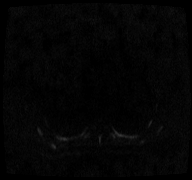

[Series 9: ep2d_diff_b50_500_800_p2_trig_adc · axial · 6.0mm · 1.82mm/px · 1 of 38 slices shown]
[im 1/38]
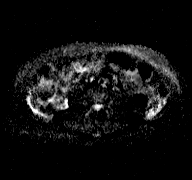

[Series 10: T1 dynamic · axial · non-contrast · 3.0mm · 0.68mm/px · z∈[-88,+149]mm · 3 of 80 slices shown]
[im 1/80]
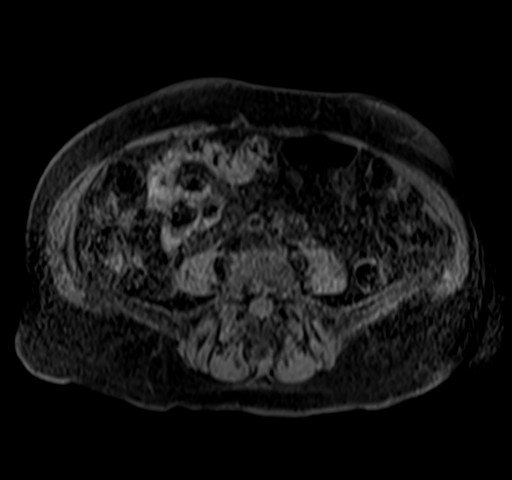
[im 40/80]
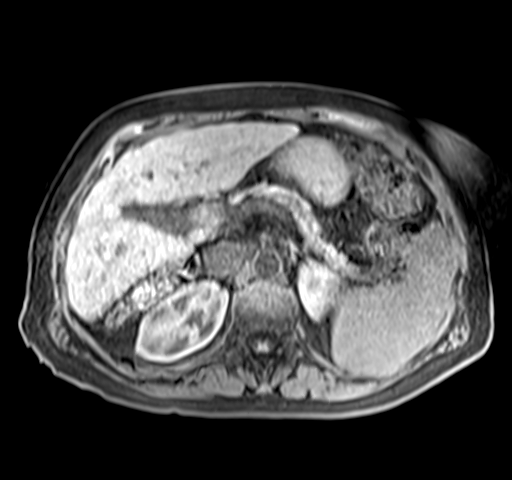
[im 80/80]
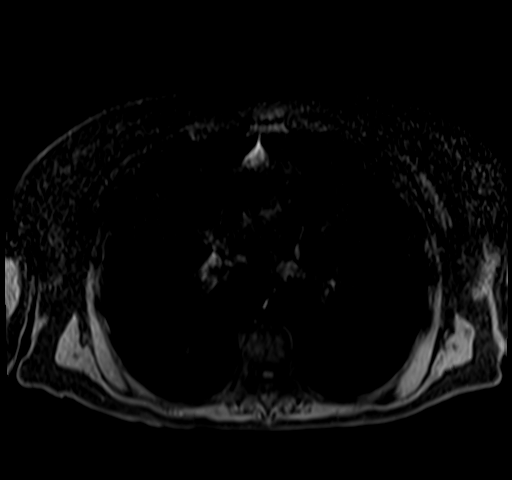

[Series 11: T1 dynamic post-contrast · axial · 3.0mm · 0.68mm/px · z∈[-88,+149]mm · 3 of 80 slices shown (1 of 3)]
[im 1/80]
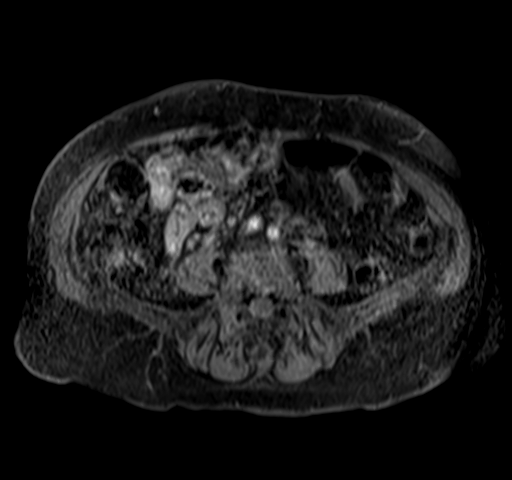
[im 40/80]
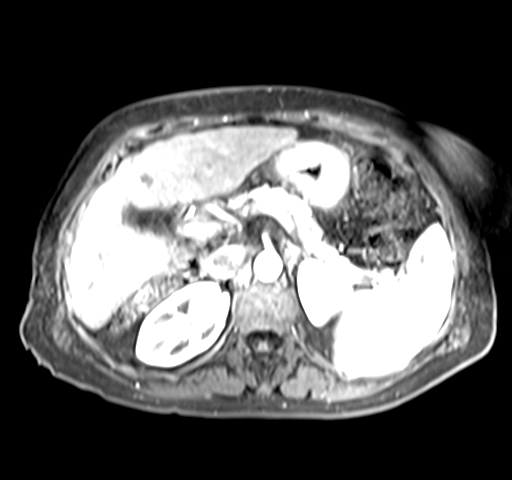
[im 80/80]
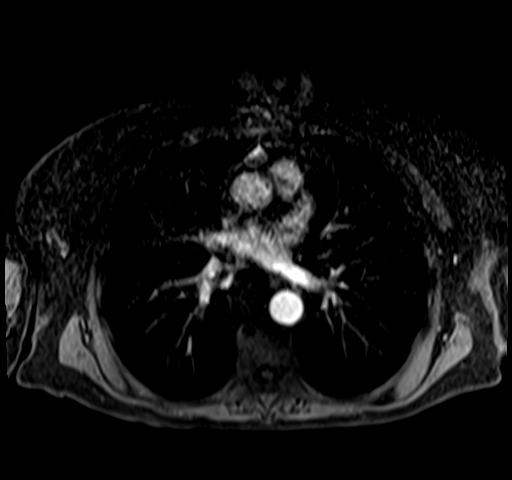

[Series 12: T1 dynamic post-contrast · axial · 3.0mm · 0.68mm/px · z∈[-88,+149]mm · 3 of 80 slices shown (2 of 3)]
[im 1/80]
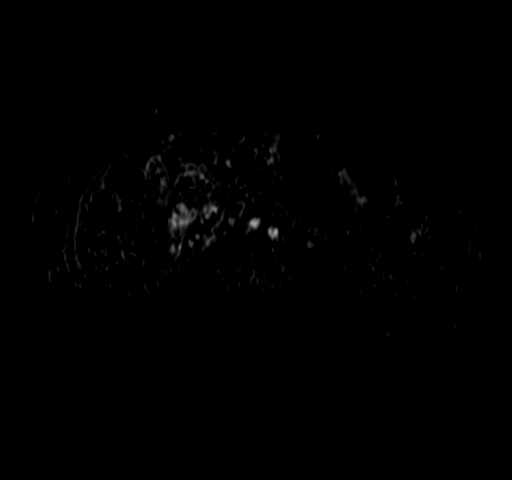
[im 40/80]
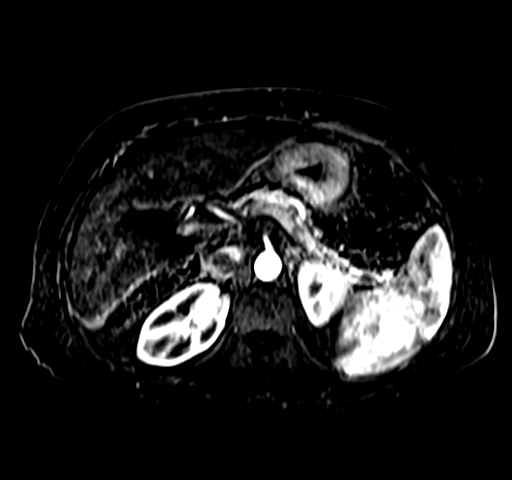
[im 80/80]
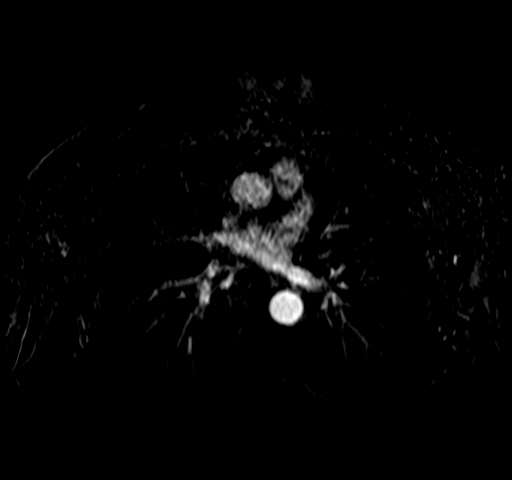

[Series 13: T1 dynamic post-contrast · axial · 3.0mm · 0.68mm/px · z∈[-88,+29]mm · 2 of 80 slices shown (3 of 3)]
[im 1/80]
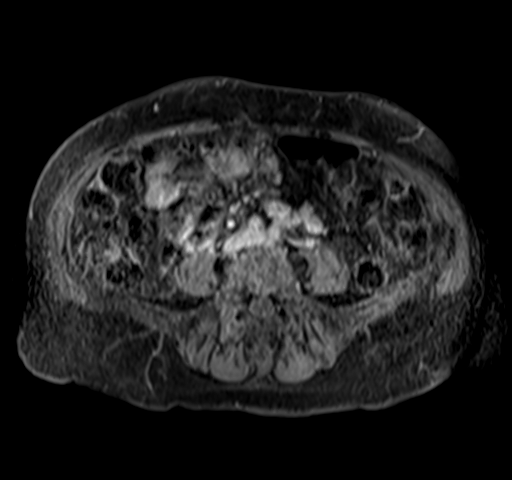
[im 40/80]
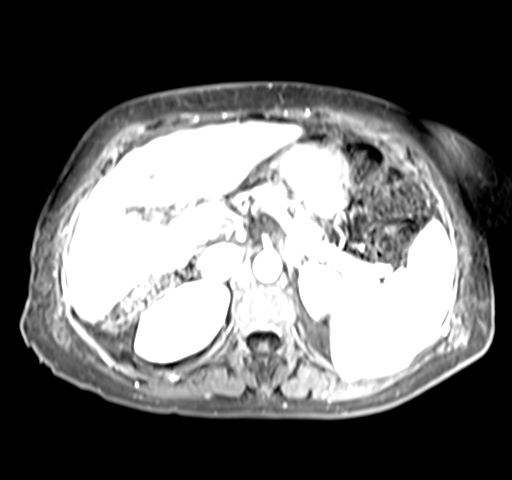

[24 of 48 positions shown; findings below may reference images not displayed]

FINDINGS: Lower chest: No acute abnormality.

Hepatobiliary: Hepatic morphologic changes consistent with
cirrhosis. New enhancing 9 mm segment V focus along the gallbladder
washout or capsule. Cholelithiasis without findings of acute
cholecystitis. No biliary ductal dilation.

Pancreas: Intrinsic T1 signal of the pancreatic parenchyma is within
normal limits. No pancreatic ductal dilation. Homogeneous
postcontrast enhancement of the pancreas. No cystic or solid
hyperenhancing pancreatic lesion identified.

Spleen:  Mild splenomegaly.

Adrenals/Urinary Tract: Bilateral adrenal glands appear normal. No
hydronephrosis. No solid enhancing renal mass.

Stomach/Bowel: Moderate volume of formed stool in the visualized
colon. No evidence of bowel obstruction or acute bowel inflammation.

Vascular/Lymphatic: Normal caliber abdominal aorta. Circumaortic
left renal vein. The portal, splenic and superior mesenteric veins
are patent. Upper abdominal collateral vessels.

No pathologically enlarged abdominal lymph nodes.

Other:  No significant abdominal free fluid.

Musculoskeletal: No suspicious bone lesions identified.
IMPRESSION: 1. Cirrhotic hepatic morphology. New 9 mm segment MORIKI 3
lesion, intermediate probability of malignancy recommend follow-up
MRI with and without contrast in 3-6 months.
2. Findings of portal hypertension including mild splenomegaly and
upper abdominal collateral vessels.
3. Cholelithiasis without findings of acute cholecystitis.

## 2022-01-12 MED ORDER — GADOBENATE DIMEGLUMINE 529 MG/ML IV SOLN
13.0000 mL | Freq: Once | INTRAVENOUS | Status: AC | PRN
  Administered 2022-01-12: 13 mL via INTRAVENOUS

## 2022-02-04 ENCOUNTER — Other Ambulatory Visit: Payer: Medicare Other

## 2022-02-04 ENCOUNTER — Ambulatory Visit: Payer: Medicare Other | Admitting: Nurse Practitioner

## 2022-02-09 DIAGNOSIS — K7460 Unspecified cirrhosis of liver: Secondary | ICD-10-CM | POA: Diagnosis not present

## 2022-02-09 DIAGNOSIS — D61818 Other pancytopenia: Secondary | ICD-10-CM | POA: Diagnosis not present

## 2022-02-09 DIAGNOSIS — K760 Fatty (change of) liver, not elsewhere classified: Secondary | ICD-10-CM | POA: Diagnosis not present

## 2022-02-09 DIAGNOSIS — D649 Anemia, unspecified: Secondary | ICD-10-CM | POA: Diagnosis not present

## 2022-02-09 DIAGNOSIS — K769 Liver disease, unspecified: Secondary | ICD-10-CM | POA: Diagnosis not present

## 2022-02-10 ENCOUNTER — Inpatient Hospital Stay: Payer: Medicare Other | Attending: Oncology

## 2022-02-10 ENCOUNTER — Inpatient Hospital Stay (HOSPITAL_BASED_OUTPATIENT_CLINIC_OR_DEPARTMENT_OTHER): Payer: Medicare Other | Admitting: Nurse Practitioner

## 2022-02-10 ENCOUNTER — Telehealth: Payer: Self-pay

## 2022-02-10 ENCOUNTER — Encounter: Payer: Self-pay | Admitting: Nurse Practitioner

## 2022-02-10 VITALS — BP 111/58 | HR 75 | Temp 98.1°F | Resp 18 | Ht 62.0 in | Wt 143.6 lb

## 2022-02-10 DIAGNOSIS — D61818 Other pancytopenia: Secondary | ICD-10-CM | POA: Diagnosis not present

## 2022-02-10 DIAGNOSIS — D508 Other iron deficiency anemias: Secondary | ICD-10-CM | POA: Diagnosis not present

## 2022-02-10 DIAGNOSIS — K5900 Constipation, unspecified: Secondary | ICD-10-CM | POA: Diagnosis not present

## 2022-02-10 DIAGNOSIS — K7460 Unspecified cirrhosis of liver: Secondary | ICD-10-CM | POA: Insufficient documentation

## 2022-02-10 DIAGNOSIS — I8510 Secondary esophageal varices without bleeding: Secondary | ICD-10-CM | POA: Diagnosis not present

## 2022-02-10 LAB — CBC WITH DIFFERENTIAL (CANCER CENTER ONLY)
Abs Immature Granulocytes: 0 10*3/uL (ref 0.00–0.07)
Basophils Absolute: 0 10*3/uL (ref 0.0–0.1)
Basophils Relative: 1 %
Eosinophils Absolute: 0.1 10*3/uL (ref 0.0–0.5)
Eosinophils Relative: 2 %
HCT: 35.2 % — ABNORMAL LOW (ref 36.0–46.0)
Hemoglobin: 11.6 g/dL — ABNORMAL LOW (ref 12.0–15.0)
Immature Granulocytes: 0 %
Lymphocytes Relative: 29 %
Lymphs Abs: 1 10*3/uL (ref 0.7–4.0)
MCH: 30.6 pg (ref 26.0–34.0)
MCHC: 33 g/dL (ref 30.0–36.0)
MCV: 92.9 fL (ref 80.0–100.0)
Monocytes Absolute: 0.5 10*3/uL (ref 0.1–1.0)
Monocytes Relative: 13 %
Neutro Abs: 1.9 10*3/uL (ref 1.7–7.7)
Neutrophils Relative %: 55 %
Platelet Count: 57 10*3/uL — ABNORMAL LOW (ref 150–400)
RBC: 3.79 MIL/uL — ABNORMAL LOW (ref 3.87–5.11)
RDW: 14.8 % (ref 11.5–15.5)
WBC Count: 3.4 10*3/uL — ABNORMAL LOW (ref 4.0–10.5)
nRBC: 0 % (ref 0.0–0.2)

## 2022-02-10 LAB — CMP (CANCER CENTER ONLY)
ALT: 17 U/L (ref 0–44)
AST: 30 U/L (ref 15–41)
Albumin: 3.8 g/dL (ref 3.5–5.0)
Alkaline Phosphatase: 57 U/L (ref 38–126)
Anion gap: 9 (ref 5–15)
BUN: 10 mg/dL (ref 8–23)
CO2: 24 mmol/L (ref 22–32)
Calcium: 10.1 mg/dL (ref 8.9–10.3)
Chloride: 106 mmol/L (ref 98–111)
Creatinine: 0.52 mg/dL (ref 0.44–1.00)
GFR, Estimated: >60 mL/min (ref >60)
Glucose, Bld: 89 mg/dL (ref 70–99)
Potassium: 4.2 mmol/L (ref 3.5–5.1)
Sodium: 139 mmol/L (ref 135–145)
Total Bilirubin: 1.4 mg/dL — ABNORMAL HIGH (ref 0.3–1.2)
Total Protein: 6.2 g/dL — ABNORMAL LOW (ref 6.5–8.1)

## 2022-02-10 LAB — FERRITIN: Ferritin: 37 ng/mL (ref 11–307)

## 2022-02-10 NOTE — Progress Notes (Signed)
  [place] OFFICE PROGRESS NOTE   Diagnosis: Pancytopenia  INTERVAL HISTORY:   Ms. Alvi returns as scheduled.  She is taking oral iron daily.  She notes significant improvement in her energy level.  Some constipation which is relieved with lactulose.  She recently began Lasix and spironolactone.  Objective:  Vital signs in last 24 hours:  Blood pressure (!) 111/58, pulse 75, temperature 98.1 F (36.7 C), temperature source Oral, resp. rate 18, height '5\' 2"'$  (1.575 m), weight 143 lb 9.6 oz (65.1 kg), SpO2 100 %.    Resp: Lungs clear bilaterally. Cardio: Regular rate and rhythm. GI: Abdomen soft and nontender.  No hepatosplenomegaly. Vascular: No leg edema.   Lab Results:  Lab Results  Component Value Date   WBC 3.4 (L) 02/10/2022   HGB 11.6 (L) 02/10/2022   HCT 35.2 (L) 02/10/2022   MCV 92.9 02/10/2022   PLT 57 (L) 02/10/2022   NEUTROABS 1.9 02/10/2022    Imaging:  No results found.  Medications: I have reviewed the patient's current medications.  Assessment/Plan: Pancytopenia Cirrhosis Mild splenomegaly on MRI 02/11/2021 Mild portosystemic venous collaterals and esophageal varices on MRI 02/11/2021 02/18/2021 ferritin 18  Disposition: Ms. Blackard has persistent pancytopenia.  She began oral iron following the last office visit 2 months ago.  Hemoglobin is better.  She will continue the same.  She will follow-up with Dr. Roena Malady for evaluation of iron deficiency, cirrhosis management.  She will return for lab and follow-up in 3 months.    Ned Card ANP/GNP-BC   02/10/2022  2:22 PM  Addendum 02/10/2022 3:14 PM-while in the exam room Ms. Wanner stated she felt "clammy".  She thought it was likely her blood sugar was low as she had not eaten since breakfast.  CBG returned at 67.  She was provided with nutrition.  Symptoms resolved.  Repeat CBG 110.

## 2022-02-10 NOTE — Telephone Encounter (Signed)
Pt came in today for visit. Pt informed Lattie Haw T NP she was feeling clammy,Fingerstick ordered, fingerstick taken .Pt's BS was 67. Pt was given crackers and orange juice After 30 minutes Pt stated she was feeling better,fingerstick retaken BS was 109 Pt left office Alert and oriented x 3 with no other complaints noted.

## 2022-03-29 DIAGNOSIS — K769 Liver disease, unspecified: Secondary | ICD-10-CM | POA: Diagnosis not present

## 2022-03-29 DIAGNOSIS — K7460 Unspecified cirrhosis of liver: Secondary | ICD-10-CM | POA: Diagnosis not present

## 2022-03-30 ENCOUNTER — Other Ambulatory Visit: Payer: Self-pay | Admitting: Gastroenterology

## 2022-03-30 DIAGNOSIS — K769 Liver disease, unspecified: Secondary | ICD-10-CM

## 2022-03-30 DIAGNOSIS — K7469 Other cirrhosis of liver: Secondary | ICD-10-CM

## 2022-04-13 ENCOUNTER — Ambulatory Visit (INDEPENDENT_AMBULATORY_CARE_PROVIDER_SITE_OTHER): Payer: Medicare Other | Admitting: Podiatry

## 2022-04-13 ENCOUNTER — Encounter: Payer: Self-pay | Admitting: Podiatry

## 2022-04-13 DIAGNOSIS — B351 Tinea unguium: Secondary | ICD-10-CM

## 2022-04-13 DIAGNOSIS — E1159 Type 2 diabetes mellitus with other circulatory complications: Secondary | ICD-10-CM

## 2022-04-13 DIAGNOSIS — M79674 Pain in right toe(s): Secondary | ICD-10-CM | POA: Diagnosis not present

## 2022-04-13 DIAGNOSIS — M79675 Pain in left toe(s): Secondary | ICD-10-CM | POA: Diagnosis not present

## 2022-04-13 NOTE — Progress Notes (Signed)
This patient presents to the office with chief complaint of long thick nails and diabetic feet.  This patient  says there  is  no pain and discomfort in her feet.  This patient says there are long thick painful nails.  These nails are painful walking and wearing shoes.  Patient has no history of infection or drainage from both feet.  Patient is unable to  self treat his own nails .This patient has cancer in her liver which has caused diabetes. This patient presents  to the office today for treatment of the  long nails and a foot evaluation due to history of  diabetes.  General Appearance  Alert, conversant and in no acute stress.  Vascular  Dorsalis pedis and posterior tibial  pulses are weakly palpable  right.  Dorsalis pedis and posterior tibial pulses are absent.    Capillary return is within normal limits  bilaterally. Temperature is within normal limits  bilaterally.  Neurologic  Senn-Weinstein monofilament wire test within normal limits  bilaterally. Muscle power within normal limits bilaterally.  Nails Thick disfigured discolored nails with subungual debris  from hallux to fifth toes bilaterally. No evidence of bacterial infection or drainage bilaterally.  Orthopedic  No limitations of motion of motion feet .  No crepitus or effusions noted.  No bony pathology or digital deformities noted. Midfoot DJD  B/L.  Skin  normotropic skin with no porokeratosis noted bilaterally.  No signs of infections or ulcers noted.     Onychomycosis  Diabetes with vascular pathology.  IE  Debride nails x 10.  A diabetic foot exam was performed and there is no evidence of any vascular pathology.  LOPS  WNL.   RTC 3 months.   Jaelani Posa DPM   

## 2022-04-17 ENCOUNTER — Ambulatory Visit
Admission: RE | Admit: 2022-04-17 | Discharge: 2022-04-17 | Disposition: A | Discharge status: Home / Self Care | Payer: Medicare Other | Source: Ambulatory Visit | Attending: Gastroenterology | Admitting: Gastroenterology

## 2022-04-17 DIAGNOSIS — K7469 Other cirrhosis of liver: Secondary | ICD-10-CM

## 2022-04-17 DIAGNOSIS — K769 Liver disease, unspecified: Secondary | ICD-10-CM

## 2022-04-17 DIAGNOSIS — K766 Portal hypertension: Secondary | ICD-10-CM | POA: Diagnosis not present

## 2022-04-17 DIAGNOSIS — I8500 Esophageal varices without bleeding: Secondary | ICD-10-CM | POA: Diagnosis not present

## 2022-04-17 DIAGNOSIS — K8020 Calculus of gallbladder without cholecystitis without obstruction: Secondary | ICD-10-CM | POA: Diagnosis not present

## 2022-04-17 DIAGNOSIS — K7460 Unspecified cirrhosis of liver: Secondary | ICD-10-CM | POA: Diagnosis not present

## 2022-04-17 MED ORDER — GADOBENATE DIMEGLUMINE 529 MG/ML IV SOLN
13.0000 mL | Freq: Once | INTRAVENOUS | Status: AC | PRN
  Administered 2022-04-17: 13 mL via INTRAVENOUS

## 2022-05-10 ENCOUNTER — Inpatient Hospital Stay: Payer: Medicare Other | Attending: Oncology

## 2022-05-10 ENCOUNTER — Inpatient Hospital Stay (HOSPITAL_BASED_OUTPATIENT_CLINIC_OR_DEPARTMENT_OTHER): Payer: Medicare Other | Admitting: Oncology

## 2022-05-10 VITALS — BP 119/58 | HR 78 | Temp 98.1°F | Resp 18 | Ht 62.0 in | Wt 144.6 lb

## 2022-05-10 DIAGNOSIS — K7460 Unspecified cirrhosis of liver: Secondary | ICD-10-CM | POA: Diagnosis not present

## 2022-05-10 DIAGNOSIS — R161 Splenomegaly, not elsewhere classified: Secondary | ICD-10-CM | POA: Insufficient documentation

## 2022-05-10 DIAGNOSIS — D61818 Other pancytopenia: Secondary | ICD-10-CM | POA: Insufficient documentation

## 2022-05-10 DIAGNOSIS — D508 Other iron deficiency anemias: Secondary | ICD-10-CM

## 2022-05-10 LAB — CBC WITH DIFFERENTIAL (CANCER CENTER ONLY)
Abs Immature Granulocytes: 0 10*3/uL (ref 0.00–0.07)
Basophils Absolute: 0 10*3/uL (ref 0.0–0.1)
Basophils Relative: 0 %
Eosinophils Absolute: 0.1 10*3/uL (ref 0.0–0.5)
Eosinophils Relative: 1 %
HCT: 36.4 % (ref 36.0–46.0)
Hemoglobin: 12.2 g/dL (ref 12.0–15.0)
Immature Granulocytes: 0 %
Lymphocytes Relative: 29 %
Lymphs Abs: 1.1 10*3/uL (ref 0.7–4.0)
MCH: 32.1 pg (ref 26.0–34.0)
MCHC: 33.5 g/dL (ref 30.0–36.0)
MCV: 95.8 fL (ref 80.0–100.0)
Monocytes Absolute: 0.5 10*3/uL (ref 0.1–1.0)
Monocytes Relative: 13 %
Neutro Abs: 2.1 10*3/uL (ref 1.7–7.7)
Neutrophils Relative %: 57 %
Platelet Count: 58 10*3/uL — ABNORMAL LOW (ref 150–400)
RBC: 3.8 MIL/uL — ABNORMAL LOW (ref 3.87–5.11)
RDW: 13.6 % (ref 11.5–15.5)
WBC Count: 3.8 10*3/uL — ABNORMAL LOW (ref 4.0–10.5)
nRBC: 0 % (ref 0.0–0.2)

## 2022-05-10 LAB — CMP (CANCER CENTER ONLY)
ALT: 17 U/L (ref 0–44)
AST: 28 U/L (ref 15–41)
Albumin: 4 g/dL (ref 3.5–5.0)
Alkaline Phosphatase: 67 U/L (ref 38–126)
Anion gap: 10 (ref 5–15)
BUN: 14 mg/dL (ref 8–23)
CO2: 27 mmol/L (ref 22–32)
Calcium: 9.8 mg/dL (ref 8.9–10.3)
Chloride: 101 mmol/L (ref 98–111)
Creatinine: 0.65 mg/dL (ref 0.44–1.00)
GFR, Estimated: >60 mL/min (ref >60)
Glucose, Bld: 137 mg/dL — ABNORMAL HIGH (ref 70–99)
Potassium: 4 mmol/L (ref 3.5–5.1)
Sodium: 138 mmol/L (ref 135–145)
Total Bilirubin: 1.7 mg/dL — ABNORMAL HIGH (ref 0.3–1.2)
Total Protein: 6.5 g/dL (ref 6.5–8.1)

## 2022-05-10 LAB — FERRITIN: Ferritin: 46 ng/mL (ref 11–307)

## 2022-05-10 NOTE — Progress Notes (Signed)
  [place] OFFICE PROGRESS NOTE   Diagnosis: Pancytopenia, cirrhosis, iron deficiency  INTERVAL HISTORY:   Sheila Hanson returns as scheduled.  She is taking iron.  She has intermittent mild nosebleeding.  No other bleeding.  No other complaint.  Good appetite.  She has malaise. She is followed by Dr. Therisa Doyne for cirrhosis.  A repeat MRI of the abdomen on 04/17/2022 revealed evidence of cirrhosis, borderline splenomegaly, portal hypertension with upper paraesophageal varices, and a mild increase in ascites.  A previously noted LR-3 lesion at the gallbladder fossa was no longer visualized.  No suspicious liver lesion.  Objective:  Vital signs in last 24 hours:  Blood pressure (!) 119/58, pulse 78, temperature 98.1 F (36.7 C), temperature source Oral, resp. rate 18, height '5\' 2"'$  (1.575 m), weight 144 lb 9.6 oz (65.6 kg), SpO2 100 %.  Resp: Lungs clear bilaterally Cardio: Regular rate and rhythm GI: No hepatosplenomegaly, no apparent ascites Vascular: Trace lower leg edema bilaterally on the right greater than left    Lab Results:  Lab Results  Component Value Date   WBC 3.8 (L) 05/10/2022   HGB 12.2 05/10/2022   HCT 36.4 05/10/2022   MCV 95.8 05/10/2022   PLT 58 (L) 05/10/2022   NEUTROABS 2.1 05/10/2022    CMP  Lab Results  Component Value Date   NA 138 05/10/2022   K 4.0 05/10/2022   CL 101 05/10/2022   CO2 27 05/10/2022   GLUCOSE 137 (H) 05/10/2022   BUN 14 05/10/2022   CREATININE 0.65 05/10/2022   CALCIUM 9.8 05/10/2022   PROT 6.5 05/10/2022   ALBUMIN 4.0 05/10/2022   AST 28 05/10/2022   ALT 17 05/10/2022   ALKPHOS 67 05/10/2022   BILITOT 1.7 (H) 05/10/2022   GFRNONAA >60 05/10/2022   GFRAA >60 05/15/2011     Medications: I have reviewed the patient's current medications.   Assessment/Plan:  Pancytopenia Cirrhosis Mild splenomegaly on MRI 02/11/2021 Mild portosystemic venous collaterals and esophageal varices on MRI 02/11/2021 MRI abdomen  04/17/2022-cirrhosis, portal hypertension with paraesophageal varices, no suspicious liver lesion, previously noted LR 3 lesion no longer seen 02/18/2021 ferritin 18   Disposition: Sheila Hanson has a history of pancytopenia.  The pancytopenia is likely secondary to cirrhosis/portal hypertension.  The ferritin was borderline low in July 2022.  The hemoglobin is higher today.  She will continue iron.  Sheila Hanson will seek medical attention for bleeding.  She will continue follow-up of the cirrhosis and pancytopenia with Dr. Therisa Doyne.  She is not scheduled for a follow-up appointment in the hematology clinic.  I am available to see her as needed.  Betsy Coder, MD  05/10/2022  3:22 PM

## 2022-06-17 DIAGNOSIS — Z961 Presence of intraocular lens: Secondary | ICD-10-CM | POA: Diagnosis not present

## 2022-06-17 DIAGNOSIS — H524 Presbyopia: Secondary | ICD-10-CM | POA: Diagnosis not present

## 2022-06-17 DIAGNOSIS — H5213 Myopia, bilateral: Secondary | ICD-10-CM | POA: Diagnosis not present

## 2022-06-17 DIAGNOSIS — E119 Type 2 diabetes mellitus without complications: Secondary | ICD-10-CM | POA: Diagnosis not present

## 2022-06-30 DIAGNOSIS — R600 Localized edema: Secondary | ICD-10-CM | POA: Diagnosis not present

## 2022-06-30 DIAGNOSIS — K7469 Other cirrhosis of liver: Secondary | ICD-10-CM | POA: Diagnosis not present

## 2022-06-30 DIAGNOSIS — R188 Other ascites: Secondary | ICD-10-CM | POA: Diagnosis not present

## 2022-07-12 ENCOUNTER — Ambulatory Visit: Payer: Medicare Other | Admitting: Podiatry

## 2022-07-14 ENCOUNTER — Ambulatory Visit: Payer: Medicare Other | Admitting: Podiatry

## 2022-07-27 ENCOUNTER — Other Ambulatory Visit: Payer: Self-pay | Admitting: Internal Medicine

## 2022-07-27 DIAGNOSIS — Z1231 Encounter for screening mammogram for malignant neoplasm of breast: Secondary | ICD-10-CM

## 2022-07-30 DIAGNOSIS — K7460 Unspecified cirrhosis of liver: Secondary | ICD-10-CM | POA: Diagnosis not present

## 2022-07-30 DIAGNOSIS — R600 Localized edema: Secondary | ICD-10-CM | POA: Diagnosis not present

## 2022-07-30 DIAGNOSIS — E1165 Type 2 diabetes mellitus with hyperglycemia: Secondary | ICD-10-CM | POA: Diagnosis not present

## 2022-08-10 ENCOUNTER — Encounter: Payer: Self-pay | Admitting: Podiatry

## 2022-08-10 ENCOUNTER — Ambulatory Visit (INDEPENDENT_AMBULATORY_CARE_PROVIDER_SITE_OTHER): Payer: Medicare Other | Admitting: Podiatry

## 2022-08-10 DIAGNOSIS — B351 Tinea unguium: Secondary | ICD-10-CM | POA: Diagnosis not present

## 2022-08-10 DIAGNOSIS — M79674 Pain in right toe(s): Secondary | ICD-10-CM | POA: Diagnosis not present

## 2022-08-10 DIAGNOSIS — E1159 Type 2 diabetes mellitus with other circulatory complications: Secondary | ICD-10-CM | POA: Diagnosis not present

## 2022-08-10 DIAGNOSIS — M79675 Pain in left toe(s): Secondary | ICD-10-CM

## 2022-08-10 NOTE — Progress Notes (Signed)
This patient presents to the office with chief complaint of long thick nails and diabetic feet.  This patient  says there  is  no pain and discomfort in her feet.  This patient says there are long thick painful nails.  These nails are painful walking and wearing shoes.  Patient has no history of infection or drainage from both feet.  Patient is unable to  self treat his own nails .This patient has cancer in her liver which has caused diabetes. This patient presents  to the office today for treatment of the  long nails and a foot evaluation due to history of  diabetes.  General Appearance  Alert, conversant and in no acute stress.  Vascular  Dorsalis pedis and posterior tibial  pulses are weakly palpable  right.  Dorsalis pedis and posterior tibial pulses are absent.    Capillary return is within normal limits  bilaterally. Temperature is within normal limits  bilaterally.  Neurologic  Senn-Weinstein monofilament wire test within normal limits  bilaterally. Muscle power within normal limits bilaterally.  Nails Thick disfigured discolored nails with subungual debris  from hallux to fifth toes bilaterally. No evidence of bacterial infection or drainage bilaterally.  Orthopedic  No limitations of motion of motion feet .  No crepitus or effusions noted.  No bony pathology or digital deformities noted. Midfoot DJD  B/L.  Skin  normotropic skin with no porokeratosis noted bilaterally.  No signs of infections or ulcers noted.     Onychomycosis  Diabetes with vascular pathology.  IE  Debride nails x 10.  A diabetic foot exam was performed and there is no evidence of any vascular pathology.  LOPS  WNL.   RTC 3 months.   Gardiner Barefoot DPM

## 2022-08-23 ENCOUNTER — Other Ambulatory Visit (HOSPITAL_COMMUNITY): Payer: Self-pay | Admitting: Internal Medicine

## 2022-08-23 ENCOUNTER — Ambulatory Visit (HOSPITAL_COMMUNITY)
Admission: RE | Admit: 2022-08-23 | Discharge: 2022-08-23 | Disposition: A | Discharge status: Home / Self Care | Payer: Medicare Other | Source: Ambulatory Visit | Attending: Surgery | Admitting: Surgery

## 2022-08-23 DIAGNOSIS — M7989 Other specified soft tissue disorders: Secondary | ICD-10-CM

## 2022-08-23 DIAGNOSIS — R600 Localized edema: Secondary | ICD-10-CM | POA: Diagnosis not present

## 2022-08-23 DIAGNOSIS — D649 Anemia, unspecified: Secondary | ICD-10-CM | POA: Diagnosis not present

## 2022-08-23 DIAGNOSIS — Z03818 Encounter for observation for suspected exposure to other biological agents ruled out: Secondary | ICD-10-CM | POA: Diagnosis not present

## 2022-08-23 DIAGNOSIS — Z23 Encounter for immunization: Secondary | ICD-10-CM | POA: Diagnosis not present

## 2022-08-23 DIAGNOSIS — M79604 Pain in right leg: Secondary | ICD-10-CM | POA: Insufficient documentation

## 2022-08-23 DIAGNOSIS — I9589 Other hypotension: Secondary | ICD-10-CM | POA: Diagnosis not present

## 2022-08-23 DIAGNOSIS — J0100 Acute maxillary sinusitis, unspecified: Secondary | ICD-10-CM | POA: Diagnosis not present

## 2022-08-23 DIAGNOSIS — M79661 Pain in right lower leg: Secondary | ICD-10-CM | POA: Diagnosis not present

## 2022-08-27 DIAGNOSIS — M25561 Pain in right knee: Secondary | ICD-10-CM | POA: Diagnosis not present

## 2022-08-27 DIAGNOSIS — M1711 Unilateral primary osteoarthritis, right knee: Secondary | ICD-10-CM | POA: Diagnosis not present

## 2022-09-21 ENCOUNTER — Ambulatory Visit
Admission: RE | Admit: 2022-09-21 | Discharge: 2022-09-21 | Disposition: A | Discharge status: Home / Self Care | Payer: Medicare Other | Source: Ambulatory Visit | Attending: Internal Medicine | Admitting: Internal Medicine

## 2022-09-21 DIAGNOSIS — Z1231 Encounter for screening mammogram for malignant neoplasm of breast: Secondary | ICD-10-CM

## 2022-10-12 DIAGNOSIS — E1165 Type 2 diabetes mellitus with hyperglycemia: Secondary | ICD-10-CM | POA: Diagnosis not present

## 2022-10-12 DIAGNOSIS — K7469 Other cirrhosis of liver: Secondary | ICD-10-CM | POA: Diagnosis not present

## 2022-10-12 DIAGNOSIS — R188 Other ascites: Secondary | ICD-10-CM | POA: Diagnosis not present

## 2022-10-12 DIAGNOSIS — R600 Localized edema: Secondary | ICD-10-CM | POA: Diagnosis not present

## 2022-10-12 DIAGNOSIS — R2689 Other abnormalities of gait and mobility: Secondary | ICD-10-CM | POA: Diagnosis not present

## 2022-10-18 ENCOUNTER — Encounter: Payer: Self-pay | Admitting: Podiatry

## 2022-10-18 ENCOUNTER — Ambulatory Visit (INDEPENDENT_AMBULATORY_CARE_PROVIDER_SITE_OTHER): Payer: Medicare Other | Admitting: Podiatry

## 2022-10-18 DIAGNOSIS — M79674 Pain in right toe(s): Secondary | ICD-10-CM

## 2022-10-18 DIAGNOSIS — M79675 Pain in left toe(s): Secondary | ICD-10-CM

## 2022-10-18 DIAGNOSIS — E1159 Type 2 diabetes mellitus with other circulatory complications: Secondary | ICD-10-CM

## 2022-10-18 DIAGNOSIS — B351 Tinea unguium: Secondary | ICD-10-CM | POA: Diagnosis not present

## 2022-10-18 NOTE — Progress Notes (Signed)
This patient presents to the office with chief complaint of long thick nails and diabetic feet.  This patient  says there  is  no pain and discomfort in her feet.  This patient says there are long thick painful nails.  These nails are painful walking and wearing shoes.  Patient has no history of infection or drainage from both feet.  Patient is unable to  self treat his own nails .This patient has cancer in her liver which has caused diabetes. This patient presents  to the office today for treatment of the  long nails and a foot evaluation due to history of  diabetes.  General Appearance  Alert, conversant and in no acute stress.  Vascular  Dorsalis pedis and posterior tibial  pulses are weakly palpable  right.  Absent pulses left foot.   Capillary return is within normal limits  bilaterally. Temperature is within normal limits  bilaterally.  Neurologic  Senn-Weinstein monofilament wire test within normal limits  bilaterally. Muscle power within normal limits bilaterally.  Nails Thick disfigured discolored nails with subungual debris  from hallux to fifth toes bilaterally. No evidence of bacterial infection or drainage bilaterally.  Orthopedic  No limitations of motion of motion feet .  No crepitus or effusions noted.  No bony pathology or digital deformities noted. Midfoot DJD  B/L.  Skin  normotropic skin with no porokeratosis noted bilaterally.  No signs of infections or ulcers noted.     Onychomycosis  Diabetes with vascular pathology.  Debride nails with nail nipper and dremel tool.    RTC 3 months.   Gardiner Barefoot DPM

## 2022-10-22 ENCOUNTER — Other Ambulatory Visit: Payer: Self-pay | Admitting: Physician Assistant

## 2022-10-22 ENCOUNTER — Ambulatory Visit
Admission: RE | Admit: 2022-10-22 | Discharge: 2022-10-22 | Disposition: A | Discharge status: Home / Self Care | Payer: Medicare Other | Source: Ambulatory Visit | Attending: Physician Assistant | Admitting: Physician Assistant

## 2022-10-22 DIAGNOSIS — R109 Unspecified abdominal pain: Secondary | ICD-10-CM

## 2022-10-22 DIAGNOSIS — K7469 Other cirrhosis of liver: Secondary | ICD-10-CM | POA: Diagnosis not present

## 2022-10-22 DIAGNOSIS — E1165 Type 2 diabetes mellitus with hyperglycemia: Secondary | ICD-10-CM | POA: Diagnosis not present

## 2022-10-22 DIAGNOSIS — R10811 Right upper quadrant abdominal tenderness: Secondary | ICD-10-CM | POA: Diagnosis not present

## 2022-10-22 DIAGNOSIS — K8020 Calculus of gallbladder without cholecystitis without obstruction: Secondary | ICD-10-CM | POA: Diagnosis not present

## 2022-10-22 DIAGNOSIS — R102 Pelvic and perineal pain: Secondary | ICD-10-CM | POA: Diagnosis not present

## 2022-10-28 ENCOUNTER — Ambulatory Visit: Payer: Medicare Other | Admitting: Physical Therapy

## 2022-11-04 NOTE — Therapy (Signed)
OUTPATIENT PHYSICAL THERAPY LOWER EXTREMITY EVALUATION   Patient Name: Sheila Hanson MRN: [id number] DOB:[birth year], 89 y.o., female Today's Date: 11/05/2022  END OF SESSION:  PT End of Session - 11/05/22 1157     Visit Number 1    PT Start Time 1100    PT Stop Time 1145    PT Time Calculation (min) 45 min    Activity Tolerance Patient tolerated treatment well    Behavior During Therapy [facility] for tasks assessed/performed             Past Medical History:  Diagnosis Date   Cirrhosis ([place])    Colon adenoma 08/16/2005   Coronary artery disease    Diabetes mellitus without complication (HCC)    GERD (gastroesophageal reflux disease)    Humeral head fracture    Hypercholesteremia    Hypertension    Osteopenia    Positive PPD    Varicose veins    Wrist fracture    History reviewed. No pertinent surgical history. Patient Active Problem List   Diagnosis Date Noted   Pain due to onychomycosis of toenails of both feet 01/06/2022   Type 2 diabetes mellitus with vascular disease ([place]) 01/06/2022    PCP: Kathalene Frames, MD  REFERRING PROVIDER: Kathalene Frames, MD  REFERRING DIAG: R26.89 (ICD-10-CM) - Other abnormalities of gait and mobility   THERAPY DIAG:  Other abnormalities of gait and mobility  Rationale for Evaluation and Treatment: Rehabilitation  ONSET DATE: 10/12/22  SUBJECTIVE:   SUBJECTIVE STATEMENT: Patient reports her husband has been ill for many years with dementia. He fell last June, injured his back. She been his caregiver since Sept. Hospice is now involved. She also has about a 10 year H/O liver cirrhosis. It has exacerbated since she has had the stress of being a caregiver. She now has very low energy, sleeps a lot, and is diabetic, she is anemic and had Covid within the past 6 months. She had brain fog after Covid and felt very run down. He Dr changed her liver medication, which has resulted in her feeling better and stronger. She has an  appointment on April 29th at [facility] to assess her for possible clinical trial for her liver. She was having a bad day when she saw her Dr who referred her to PT, but feels better now and not sure she needs it.  PERTINENT HISTORY: Per referring physician visit 10/12/22 LE edema DVT ruled out, DM, liver cirrhosis, poor balance-recommend using cane or walker, weakness 08/27/22-R knee Cortizone injection PAIN:  Are you having pain? Yes: NPRS scale: 1/10 Pain location: low back Pain description: nagging Aggravating factors: thinks she slept wrong Relieving factors: N/A, new onset  PRECAUTIONS: Fall  WEIGHT BEARING RESTRICTIONS: No  FALLS:  Has patient fallen in last 6 months? No  LIVING ENVIRONMENT: Lives with: lives with their spouse Lives in: House/apartment Stairs:  Has a ramp, but currently uses steps with a rail Has following equipment at home: None  OCCUPATION: N/A  PLOF: Independent  PATIENT GOALS: Assess and see if she has any needs for PT.  NEXT MD VISIT: unknown  OBJECTIVE:   DIAGNOSTIC FINDINGS:  [place] R knee-DVT(-), Baker's cyst (+) X ray1/12/24-R mod medial compartment arthritis, joint spaces preserved  COGNITION: Overall cognitive status: Within functional limits for tasks assessed     SENSATION: Not tested  EDEMA:  N/A  MUSCLE LENGTH: Hamstrings: WFL B Thomas test: N/T  POSTURE: No Significant postural limitations  LOWER EXTREMITY ROM: [facility]  LOWER EXTREMITY MMT: WFL  FUNCTIONAL TESTS:  5 times sit to stand: 9.82, lean back against mat and used hands, unsteady, required VC to stand up all the way. Timed up and go (TUG): 12.24-stable Functional gait assessment: 24/30  GAIT: Distance walked: in clinic distances Assistive device utilized: None Level of assistance: Complete Independence Comments: No deviations noted. She reports no problems with gait or no limitations.   TODAY'S TREATMENT:                                                                                                                               DATE:  11/05/22  Evaluation, education  PATIENT EDUCATION:  Education details: POC Person educated: Patient Education method: Explanation Education comprehension: verbalized understanding  HOME EXERCISE PROGRAM: N/A  ASSESSMENT:  CLINICAL IMPRESSION: Patient is a 89 y.o. who was seen today for physical therapy evaluation and treatment for gait abnormality. She was referred about a month ago when her Dr noticed she was very unsteady. She agreed that she needed it at the time, but reports that since that time, she had a medication change which she feels helped clear her brain fog and helped her to feel stronger and have more energy. Today's exam showed her to be [facility] for all strength, balance, and activity tolerance. She is under a lot of stress at home physically and mentally due to being the primary caregiver for her husband. Therapist stressed to patient the importance of taking care of herself as well as her husband, recommended additional help if she could get it. She reports that Hospice is now involved, encouraged her to talk with them to identify any potential resources available to assist her. Also encouraged her to monitor herself and if she starts to feel weakness or fatigue again, request another referral to PT for re-assessment as her status may change due to her medical co-morbidities.  OBJECTIVE IMPAIRMENTS: none   ACTIVITY LIMITATIONS: none  PARTICIPATION LIMITATIONS: none  PERSONAL FACTORS: Age and Past/current experiences are also affecting patient's functional outcome.   REHAB POTENTIAL: Good  CLINICAL DECISION MAKING: Stable/uncomplicated  EVALUATION COMPLEXITY: Low   GOALS: N/A PLAN:  PT FREQUENCY: one time visit  PT DURATION:  1 sessions  PLANNED INTERVENTIONS: N/A  PLAN FOR NEXT SESSION: N/A   Marcelina Morel, DPT 11/05/2022, 11:59 AM

## 2022-11-05 ENCOUNTER — Ambulatory Visit: Payer: Medicare Other | Attending: Internal Medicine | Admitting: Physical Therapy

## 2022-11-05 ENCOUNTER — Encounter: Payer: Self-pay | Admitting: Physical Therapy

## 2022-11-05 DIAGNOSIS — R2689 Other abnormalities of gait and mobility: Secondary | ICD-10-CM | POA: Insufficient documentation

## 2022-11-10 ENCOUNTER — Ambulatory Visit: Payer: Medicare Other | Admitting: Podiatry

## 2022-11-23 DIAGNOSIS — K7469 Other cirrhosis of liver: Secondary | ICD-10-CM | POA: Diagnosis not present

## 2022-12-13 DIAGNOSIS — K7581 Nonalcoholic steatohepatitis (NASH): Secondary | ICD-10-CM | POA: Diagnosis not present

## 2022-12-13 DIAGNOSIS — K7469 Other cirrhosis of liver: Secondary | ICD-10-CM | POA: Diagnosis not present

## 2022-12-13 DIAGNOSIS — K7689 Other specified diseases of liver: Secondary | ICD-10-CM | POA: Diagnosis not present

## 2022-12-30 ENCOUNTER — Ambulatory Visit: Payer: Medicare Other | Admitting: Podiatry

## 2023-01-19 DIAGNOSIS — E1165 Type 2 diabetes mellitus with hyperglycemia: Secondary | ICD-10-CM | POA: Diagnosis not present

## 2023-01-19 DIAGNOSIS — Z0000 Encounter for general adult medical examination without abnormal findings: Secondary | ICD-10-CM | POA: Diagnosis not present

## 2023-01-19 DIAGNOSIS — Z23 Encounter for immunization: Secondary | ICD-10-CM | POA: Diagnosis not present

## 2023-01-19 DIAGNOSIS — K7469 Other cirrhosis of liver: Secondary | ICD-10-CM | POA: Diagnosis not present

## 2023-01-19 DIAGNOSIS — E1140 Type 2 diabetes mellitus with diabetic neuropathy, unspecified: Secondary | ICD-10-CM | POA: Diagnosis not present

## 2023-01-25 DIAGNOSIS — K7469 Other cirrhosis of liver: Secondary | ICD-10-CM | POA: Diagnosis not present

## 2023-02-04 ENCOUNTER — Ambulatory Visit (INDEPENDENT_AMBULATORY_CARE_PROVIDER_SITE_OTHER): Payer: Medicare Other | Admitting: Podiatry

## 2023-02-04 ENCOUNTER — Encounter: Payer: Self-pay | Admitting: Podiatry

## 2023-02-04 VITALS — BP 114/50

## 2023-02-04 DIAGNOSIS — M79675 Pain in left toe(s): Secondary | ICD-10-CM

## 2023-02-04 DIAGNOSIS — B351 Tinea unguium: Secondary | ICD-10-CM | POA: Diagnosis not present

## 2023-02-04 DIAGNOSIS — M79674 Pain in right toe(s): Secondary | ICD-10-CM

## 2023-02-04 DIAGNOSIS — E1159 Type 2 diabetes mellitus with other circulatory complications: Secondary | ICD-10-CM | POA: Diagnosis not present

## 2023-02-04 NOTE — Progress Notes (Signed)
This patient presents to the office with chief complaint of long thick nails and diabetic feet.  This patient  says there  is  no pain and discomfort in her feet.  This patient says there are long thick painful nails.  These nails are painful walking and wearing shoes.  Patient has no history of infection or drainage from both feet.  Patient is unable to  self treat his own nails .This patient has cancer in her liver which has caused diabetes. This patient presents  to the office today for treatment of the  long nails and a foot evaluation due to history of  diabetes.  General Appearance  Alert, conversant and in no acute stress.  Vascular  Dorsalis pedis and posterior tibial  pulses are weakly palpable  right.  Absent pulses left foot.   Capillary return is within normal limits  bilaterally. Temperature is within normal limits  bilaterally.  Neurologic  Senn-Weinstein monofilament wire test within normal limits  bilaterally. Muscle power within normal limits bilaterally.  Nails Thick disfigured discolored nails with subungual debris  from hallux to fifth toes bilaterally. No evidence of bacterial infection or drainage bilaterally.  Orthopedic  No limitations of motion of motion feet .  No crepitus or effusions noted.  No bony pathology or digital deformities noted. Midfoot DJD  B/L.  Skin  normotropic skin with no porokeratosis noted bilaterally.  No signs of infections or ulcers noted.     Onychomycosis  Diabetes with vascular pathology.  Debride nails with nail nipper and dremel tool.    RTC 3 months.   Jennier Schissler DPM   

## 2023-02-18 DIAGNOSIS — K7469 Other cirrhosis of liver: Secondary | ICD-10-CM | POA: Diagnosis not present

## 2023-02-18 DIAGNOSIS — Z23 Encounter for immunization: Secondary | ICD-10-CM | POA: Diagnosis not present

## 2023-02-18 DIAGNOSIS — E1165 Type 2 diabetes mellitus with hyperglycemia: Secondary | ICD-10-CM | POA: Diagnosis not present

## 2023-04-27 ENCOUNTER — Other Ambulatory Visit: Payer: Self-pay | Admitting: Internal Medicine

## 2023-04-27 DIAGNOSIS — K7689 Other specified diseases of liver: Secondary | ICD-10-CM

## 2023-05-16 ENCOUNTER — Ambulatory Visit
Admission: RE | Admit: 2023-05-16 | Discharge: 2023-05-16 | Disposition: A | Discharge status: Home / Self Care | Payer: Medicare Other | Source: Ambulatory Visit | Attending: Internal Medicine | Admitting: Internal Medicine

## 2023-05-16 DIAGNOSIS — K8020 Calculus of gallbladder without cholecystitis without obstruction: Secondary | ICD-10-CM | POA: Diagnosis not present

## 2023-05-16 DIAGNOSIS — K7460 Unspecified cirrhosis of liver: Secondary | ICD-10-CM | POA: Diagnosis not present

## 2023-05-16 DIAGNOSIS — K7689 Other specified diseases of liver: Secondary | ICD-10-CM

## 2023-05-16 MED ORDER — GADOPICLENOL 0.5 MMOL/ML IV SOLN
6.0000 mL | Freq: Once | INTRAVENOUS | Status: AC | PRN
  Administered 2023-05-16: 6 mL via INTRAVENOUS

## 2023-05-19 ENCOUNTER — Encounter: Payer: Self-pay | Admitting: Podiatry

## 2023-05-19 ENCOUNTER — Ambulatory Visit: Payer: Medicare Other | Admitting: Podiatry

## 2023-05-19 DIAGNOSIS — E1159 Type 2 diabetes mellitus with other circulatory complications: Secondary | ICD-10-CM

## 2023-05-19 DIAGNOSIS — M79674 Pain in right toe(s): Secondary | ICD-10-CM | POA: Diagnosis not present

## 2023-05-19 DIAGNOSIS — M79675 Pain in left toe(s): Secondary | ICD-10-CM | POA: Diagnosis not present

## 2023-05-19 DIAGNOSIS — B351 Tinea unguium: Secondary | ICD-10-CM

## 2023-05-19 NOTE — Progress Notes (Signed)
This patient presents to the office with chief complaint of long thick nails and diabetic feet.  This patient  says there  is  no pain and discomfort in her feet.  This patient says there are long thick painful nails.  These nails are painful walking and wearing shoes.  Patient has no history of infection or drainage from both feet.  Patient is unable to  self treat his own nails .This patient has cancer in her liver which has caused diabetes. This patient presents  to the office today for treatment of the  long nails and a foot evaluation due to history of  diabetes.  General Appearance  Alert, conversant and in no acute stress.  Vascular  Dorsalis pedis and posterior tibial  pulses are weakly palpable  right.  Absent pulses left foot.   Capillary return is within normal limits  bilaterally. Temperature is within normal limits  bilaterally.  Neurologic  Senn-Weinstein monofilament wire test within normal limits  bilaterally. Muscle power within normal limits bilaterally.  Nails Thick disfigured discolored nails with subungual debris  from hallux to fifth toes bilaterally. No evidence of bacterial infection or drainage bilaterally.  Orthopedic  No limitations of motion of motion feet .  No crepitus or effusions noted.  No bony pathology or digital deformities noted. Midfoot DJD  B/L.  Skin  normotropic skin with no porokeratosis noted bilaterally.  No signs of infections or ulcers noted.     Onychomycosis  Diabetes with vascular pathology.  Debride nails with nail nipper and dremel tool.    RTC 3 months.   Gardiner Barefoot DPM

## 2023-05-20 DIAGNOSIS — E1140 Type 2 diabetes mellitus with diabetic neuropathy, unspecified: Secondary | ICD-10-CM | POA: Diagnosis not present

## 2023-05-20 DIAGNOSIS — K7469 Other cirrhosis of liver: Secondary | ICD-10-CM | POA: Diagnosis not present

## 2023-05-20 DIAGNOSIS — E1165 Type 2 diabetes mellitus with hyperglycemia: Secondary | ICD-10-CM | POA: Diagnosis not present

## 2023-05-20 DIAGNOSIS — E611 Iron deficiency: Secondary | ICD-10-CM | POA: Diagnosis not present

## 2023-05-20 DIAGNOSIS — Z23 Encounter for immunization: Secondary | ICD-10-CM | POA: Diagnosis not present

## 2023-06-02 DIAGNOSIS — K7581 Nonalcoholic steatohepatitis (NASH): Secondary | ICD-10-CM | POA: Diagnosis not present

## 2023-06-02 DIAGNOSIS — Z1159 Encounter for screening for other viral diseases: Secondary | ICD-10-CM | POA: Diagnosis not present

## 2023-06-02 DIAGNOSIS — Z7289 Other problems related to lifestyle: Secondary | ICD-10-CM | POA: Diagnosis not present

## 2023-06-02 DIAGNOSIS — R978 Other abnormal tumor markers: Secondary | ICD-10-CM | POA: Diagnosis not present

## 2023-06-15 DIAGNOSIS — R051 Acute cough: Secondary | ICD-10-CM | POA: Diagnosis not present

## 2023-06-15 DIAGNOSIS — U071 COVID-19: Secondary | ICD-10-CM | POA: Diagnosis not present

## 2023-06-15 DIAGNOSIS — R0981 Nasal congestion: Secondary | ICD-10-CM | POA: Diagnosis not present

## 2023-07-22 DIAGNOSIS — Z23 Encounter for immunization: Secondary | ICD-10-CM | POA: Diagnosis not present

## 2023-07-22 DIAGNOSIS — K7469 Other cirrhosis of liver: Secondary | ICD-10-CM | POA: Diagnosis not present

## 2023-07-22 DIAGNOSIS — E1165 Type 2 diabetes mellitus with hyperglycemia: Secondary | ICD-10-CM | POA: Diagnosis not present

## 2023-07-22 DIAGNOSIS — R5383 Other fatigue: Secondary | ICD-10-CM | POA: Diagnosis not present

## 2023-08-14 ENCOUNTER — Emergency Department (HOSPITAL_COMMUNITY): Payer: Medicare Other

## 2023-08-14 ENCOUNTER — Encounter (HOSPITAL_COMMUNITY): Payer: Self-pay

## 2023-08-14 ENCOUNTER — Other Ambulatory Visit: Payer: Self-pay

## 2023-08-14 ENCOUNTER — Emergency Department (HOSPITAL_COMMUNITY)
Admission: EM | Admit: 2023-08-14 | Discharge: 2023-08-14 | Disposition: A | Discharge status: Home / Self Care | Payer: Medicare Other | Attending: Emergency Medicine | Admitting: Emergency Medicine

## 2023-08-14 DIAGNOSIS — K7682 Hepatic encephalopathy: Secondary | ICD-10-CM | POA: Insufficient documentation

## 2023-08-14 DIAGNOSIS — Z7984 Long term (current) use of oral hypoglycemic drugs: Secondary | ICD-10-CM | POA: Diagnosis not present

## 2023-08-14 DIAGNOSIS — I4510 Unspecified right bundle-branch block: Secondary | ICD-10-CM | POA: Insufficient documentation

## 2023-08-14 DIAGNOSIS — D72819 Decreased white blood cell count, unspecified: Secondary | ICD-10-CM | POA: Insufficient documentation

## 2023-08-14 DIAGNOSIS — R161 Splenomegaly, not elsewhere classified: Secondary | ICD-10-CM | POA: Insufficient documentation

## 2023-08-14 DIAGNOSIS — E119 Type 2 diabetes mellitus without complications: Secondary | ICD-10-CM | POA: Insufficient documentation

## 2023-08-14 DIAGNOSIS — K7460 Unspecified cirrhosis of liver: Secondary | ICD-10-CM | POA: Diagnosis not present

## 2023-08-14 DIAGNOSIS — D696 Thrombocytopenia, unspecified: Secondary | ICD-10-CM | POA: Insufficient documentation

## 2023-08-14 DIAGNOSIS — R5383 Other fatigue: Secondary | ICD-10-CM | POA: Diagnosis not present

## 2023-08-14 DIAGNOSIS — R600 Localized edema: Secondary | ICD-10-CM | POA: Insufficient documentation

## 2023-08-14 DIAGNOSIS — R42 Dizziness and giddiness: Secondary | ICD-10-CM | POA: Diagnosis not present

## 2023-08-14 DIAGNOSIS — R011 Cardiac murmur, unspecified: Secondary | ICD-10-CM | POA: Diagnosis not present

## 2023-08-14 DIAGNOSIS — K219 Gastro-esophageal reflux disease without esophagitis: Secondary | ICD-10-CM | POA: Diagnosis not present

## 2023-08-14 DIAGNOSIS — R531 Weakness: Secondary | ICD-10-CM | POA: Insufficient documentation

## 2023-08-14 DIAGNOSIS — R21 Rash and other nonspecific skin eruption: Secondary | ICD-10-CM | POA: Diagnosis not present

## 2023-08-14 DIAGNOSIS — R040 Epistaxis: Secondary | ICD-10-CM | POA: Diagnosis not present

## 2023-08-14 DIAGNOSIS — E785 Hyperlipidemia, unspecified: Secondary | ICD-10-CM | POA: Diagnosis not present

## 2023-08-14 DIAGNOSIS — Z20822 Contact with and (suspected) exposure to covid-19: Secondary | ICD-10-CM | POA: Insufficient documentation

## 2023-08-14 DIAGNOSIS — I10 Essential (primary) hypertension: Secondary | ICD-10-CM | POA: Insufficient documentation

## 2023-08-14 DIAGNOSIS — R059 Cough, unspecified: Secondary | ICD-10-CM | POA: Diagnosis not present

## 2023-08-14 DIAGNOSIS — K766 Portal hypertension: Secondary | ICD-10-CM | POA: Insufficient documentation

## 2023-08-14 DIAGNOSIS — M7989 Other specified soft tissue disorders: Secondary | ICD-10-CM | POA: Diagnosis not present

## 2023-08-14 DIAGNOSIS — R2243 Localized swelling, mass and lump, lower limb, bilateral: Secondary | ICD-10-CM | POA: Insufficient documentation

## 2023-08-14 DIAGNOSIS — R0989 Other specified symptoms and signs involving the circulatory and respiratory systems: Secondary | ICD-10-CM | POA: Diagnosis not present

## 2023-08-14 LAB — BASIC METABOLIC PANEL
Anion gap: 7 (ref 5–15)
BUN: 13 mg/dL (ref 8–23)
CO2: 26 mmol/L (ref 22–32)
Calcium: 9.2 mg/dL (ref 8.9–10.3)
Chloride: 107 mmol/L (ref 98–111)
Creatinine, Ser: 0.57 mg/dL (ref 0.44–1.00)
GFR, Estimated: >60 mL/min (ref >60)
Glucose, Bld: 146 mg/dL — ABNORMAL HIGH (ref 70–99)
Potassium: 4.2 mmol/L (ref 3.5–5.1)
Sodium: 140 mmol/L (ref 135–145)

## 2023-08-14 LAB — URINALYSIS, ROUTINE W REFLEX MICROSCOPIC
Bilirubin Urine: NEGATIVE
Glucose, UA: NEGATIVE mg/dL
Hgb urine dipstick: NEGATIVE
Ketones, ur: NEGATIVE mg/dL
Leukocytes,Ua: NEGATIVE
Nitrite: NEGATIVE
Protein, ur: NEGATIVE mg/dL
Specific Gravity, Urine: 1.005 (ref 1.005–1.030)
pH: 7 (ref 5.0–8.0)

## 2023-08-14 LAB — CBC
HCT: 35.2 % — ABNORMAL LOW (ref 36.0–46.0)
Hemoglobin: 11.6 g/dL — ABNORMAL LOW (ref 12.0–15.0)
MCH: 32.6 pg (ref 26.0–34.0)
MCHC: 33 g/dL (ref 30.0–36.0)
MCV: 98.9 fL (ref 80.0–100.0)
Platelets: 52 10*3/uL — ABNORMAL LOW (ref 150–400)
RBC: 3.56 MIL/uL — ABNORMAL LOW (ref 3.87–5.11)
RDW: 13.3 % (ref 11.5–15.5)
WBC: 3.3 10*3/uL — ABNORMAL LOW (ref 4.0–10.5)
nRBC: 0 % (ref 0.0–0.2)

## 2023-08-14 LAB — HEPATIC FUNCTION PANEL
ALT: 24 U/L (ref 0–44)
AST: 37 U/L (ref 15–41)
Albumin: 3 g/dL — ABNORMAL LOW (ref 3.5–5.0)
Alkaline Phosphatase: 98 U/L (ref 38–126)
Bilirubin, Direct: 0.6 mg/dL — ABNORMAL HIGH (ref 0.0–0.2)
Indirect Bilirubin: 1.2 mg/dL — ABNORMAL HIGH (ref 0.3–0.9)
Total Bilirubin: 1.8 mg/dL — ABNORMAL HIGH (ref <1.2)
Total Protein: 5.8 g/dL — ABNORMAL LOW (ref 6.5–8.1)

## 2023-08-14 LAB — RESP PANEL BY RT-PCR (RSV, FLU A&B, COVID)  RVPGX2
Influenza A by PCR: NEGATIVE
Influenza B by PCR: NEGATIVE
Resp Syncytial Virus by PCR: NEGATIVE
SARS Coronavirus 2 by RT PCR: NEGATIVE

## 2023-08-14 LAB — CBG MONITORING, ED: Glucose-Capillary: 136 mg/dL — ABNORMAL HIGH (ref 70–99)

## 2023-08-14 LAB — PROTIME-INR
INR: 1.3 — ABNORMAL HIGH (ref 0.8–1.2)
Prothrombin Time: 16.6 s — ABNORMAL HIGH (ref 11.4–15.2)

## 2023-08-14 LAB — SARS CORONAVIRUS 2 BY RT PCR: SARS Coronavirus 2 by RT PCR: NEGATIVE

## 2023-08-14 NOTE — ED Notes (Signed)
Called microbiology reference covid test, they advised it should have resulted, and I advised it had not yet. She stated that she would check it, and add the 4 plex test onto that swab.

## 2023-08-14 NOTE — ED Triage Notes (Signed)
Pt. Stated, Sheila Hanson had a nose bleed off and on since Christmas and had dizziness off and on for a couple of months.  Has stage 4 cirrhosis.Marland Kitchen

## 2023-08-14 NOTE — ED Provider Notes (Cosign Needed)
[place] EMERGENCY DEPARTMENT AT [facility] Provider Note   CSN: [id number] Arrival date & time: 08/14/23  0940     History  Chief Complaint  Patient presents with   Dizziness   Epistaxis   Cough   Sore Throat   Insomnia   Leg Swelling    Sheila Hanson is a 89 y.o. female.  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  (Patient was seen by myself on arrival in triage).  Family also reports generalized fatigue and decreased energy over the past 1 week.  Patient reports history of thrombocytopenia.  She has been having intermittent nosebleeds controlled at home with pressure.  This morning she had a more significant nosebleed that was poorly controlled, but eventually stopped.  She states that she had to pack her nose twice to help the bleeding stopped.  Patient also reports mildly increased bilateral lower extremity swelling and abdominal swelling.  She has noted some new skin redness and rashes on her lower extremity.  She does endorse easy bruising to her skin.  She has not noted any blood in the stool, urine, or have been coughing up any blood.  She has had "bronchitis" for about a week.  This has been most noticeable in her voice, which has been hoarse.  No fevers reported.       Home Medications Prior to Admission medications   Medication Sig Start Date End Date Taking? Authorizing Provider  cholecalciferol (VITAMIN D3) 25 MCG (1000 UNIT) tablet Take 2,000 Units by mouth daily.    [provider]  Docusate Sodium (COLACE PO) Take 2 capsules by mouth at bedtime.    [provider]  estradiol (ESTRACE) 0.5 MG tablet Take 0.5 mg by mouth daily. 05/05/21   [provider]   ferrous sulfate 325 (65 FE) MG tablet Take 325 mg by mouth daily with breakfast.    [provider]  furosemide (LASIX) 20 MG tablet Take 20 mg by mouth daily. 01/18/22   [provider]  lactulose (CHRONULAC) 10 GM/15ML solution Take 10 g by mouth daily. Taking 30 ml daily 01/18/22   [provider]  loratadine (CLARITIN) 10 MG tablet Take 10 mg by mouth daily.      [provider]  metFORMIN (GLUCOPHAGE) 500 MG tablet Take by mouth daily with breakfast.    [provider]  pantoprazole (PROTONIX) 20 MG tablet Take 20 mg by mouth daily. 01/22/21   [provider]  spironolactone (ALDACTONE) 25 MG tablet Take 25 mg by mouth every morning. 01/18/22   [provider]      Allergies    Other, Penicillins, Codeine sulfate, Diphenhydramine hcl, Diphenhydramine hcl, Iohexol, Oxycodone, Oxycodone-acetaminophen, Percocet [oxycodone-acetaminophen], Shellfish-derived products, Azithromycin, Methocarbamol, and Vancomycin    Review of Systems   Review of Systems  Physical Exam Updated Vital Signs BP (!) 124/54 (BP Location: Right Arm)   Pulse 73   Temp 98.2 F (36.8 C)   Resp 15   Ht 5\' 2"  (1.575 m)   Wt 67.1 kg   SpO2 100%   BMI 27.07 kg/m   Physical Exam Vitals and nursing note reviewed.  Constitutional:      Appearance: She is well-developed. She is not diaphoretic.  HENT:  Head: Normocephalic and atraumatic.     Nose: No congestion or rhinorrhea.     Mouth/Throat:     Mouth: Mucous membranes are not dry.  Eyes:     Conjunctiva/sclera: Conjunctivae normal.  Neck:     Vascular: Normal carotid pulses. No JVD.     Trachea: Trachea normal. No tracheal deviation.  Cardiovascular:     Rate and Rhythm: Normal rate and regular rhythm.     Pulses: No decreased pulses.          Radial pulses are 2+ on the right side and 2+ on the left side.     Heart sounds: S1 normal and S2 normal. Murmur heard.     Comments: Systolic murmur  heard left lower sternal border Pulmonary:     Effort: Pulmonary effort is normal. No respiratory distress.     Breath sounds: No wheezing.     Comments: No wheezing, rare cough on exam Chest:     Chest wall: No tenderness.  Abdominal:     General: Bowel sounds are normal.     Palpations: Abdomen is soft.     Tenderness: There is no abdominal tenderness. There is no guarding or rebound.  Musculoskeletal:        General: Normal range of motion.     Cervical back: Normal range of motion and neck supple. No muscular tenderness.     Right lower leg: Edema present.     Left lower leg: Edema present.  Skin:    General: Skin is warm and dry.     Coloration: Skin is not pale.  Neurological:     Mental Status: She is alert and oriented to person, place, and time. Mental status is at baseline.     ED Results / Procedures / Treatments   Labs (all labs ordered are listed, but only abnormal results are displayed) Labs Reviewed  BASIC METABOLIC PANEL - Abnormal; Notable for the following components:      Result Value   Glucose, Bld 146 (*)    All other components within normal limits  CBC - Abnormal; Notable for the following components:   WBC 3.3 (*)    RBC 3.56 (*)    Hemoglobin 11.6 (*)    HCT 35.2 (*)    Platelets 52 (*)    All other components within normal limits  URINALYSIS, ROUTINE W REFLEX MICROSCOPIC - Abnormal; Notable for the following components:   Color, Urine STRAW (*)    All other components within normal limits  PROTIME-INR - Abnormal; Notable for the following components:   Prothrombin Time 16.6 (*)    INR 1.3 (*)    All other components within normal limits  HEPATIC FUNCTION PANEL - Abnormal; Notable for the following components:   Total Protein 5.8 (*)    Albumin 3.0 (*)    Total Bilirubin 1.8 (*)    Bilirubin, Direct 0.6 (*)    Indirect Bilirubin 1.2 (*)    All other components within normal limits  CBG MONITORING, ED - Abnormal; Notable for the following  components:   Glucose-Capillary 136 (*)    All other components within normal limits  SARS CORONAVIRUS 2 BY RT PCR  RESP PANEL BY RT-PCR (RSV, FLU A&B, COVID)  [id number]     EKG None  Radiology DG Chest 2 View Result Date: 08/14/2023 CLINICAL DATA:  Fatigue.  Cough. EXAM: CHEST - 2 VIEW COMPARISON:  07/05/2018. FINDINGS: Low lung volume. Bilateral lung fields are clear. Bilateral costophrenic angles are  clear. Stable cardio-mediastinal silhouette. No acute osseous abnormalities. The soft tissues are within normal limits. IMPRESSION: No active cardiopulmonary disease. Electronically Signed   By: Jules Schick M.D.   On: 08/14/2023 14:45    Procedures Procedures    Medications Ordered in ED Medications - No data to display  ED Course/ Medical Decision Making/ A&P    Patient seen and examined. History obtained directly from patient and family.  Reviewed outpatient [facility] notes.  Labs/EKG: Independently reviewed and interpreted.  This included: CBC with slightly low white blood cell count at 3.3, hemoglobin 11.6, platelets of 52 which is stable; BMP glucose 146 otherwise unremarkable; hepatic function panel with low proteins and albumin, otherwise normal transaminases with slightly elevated T. bili and direct bili; COVID, flu, RSV negative; UA without signs of infection.  Imaging: Independently visualized and interpreted.  This included: Chest x-ray, agree negative.  Medications/Fluids: None ordered  Most recent vital signs reviewed and are as follows: BP 105/71   Pulse 78   Temp 98.2 F (36.8 C)   Resp 13   Ht 5\' 2"  (1.575 m)   Wt 67.1 kg   SpO2 100%   BMI 27.07 kg/m   Initial impression: Patient with compensated cirrhosis, possibly slightly worse than baseline lower extremity swelling however she is already on Lasix and spironolactone.  She is compliant.  Nosebleed currently controlled, likely exacerbated by thrombocytopenia.  Also with generalized fatigue and weakness,  however no obvious significant infectious insults.  Plan: Discharge to home, family is comfortable with this plan as well.  Patient has gastroenterology follow-up tomorrow at 10 AM and she is encouraged to keep this appointment.  Patient counseled on what to do if nosebleed returns.  Discussed that if her symptoms are in part due to viral illness, that she should feel better in the next 1 or 2 weeks.  If not, she will need continued outpatient follow-up and evaluation with primary care provider.  Encouraged return to the emergency department with new or worsening symptoms, fever, syncope, chest pain, or other concerns.                                 Medical Decision Making Amount and/or Complexity of Data Reviewed Labs: ordered. Radiology: ordered.   Patient with compensated liver cirrhosis presents for generalized symptoms as well as recent recurrent nosebleeds.  Her hemoglobin and platelets are abnormal but stable.  She is not actively bleeding.  She has had signs of a viral illness over the past 1 week, however flu, COVID and RSV are negative.  The remainder of her metabolic workup is unrevealing/stable.  Vital signs are at baseline.  Patient appears to be stable to go home today.  She has appropriate follow-up.  Family is supportive.  They are willing to return if symptoms worsen.  The patient's vital signs, pertinent lab work and imaging were reviewed and interpreted as discussed in the ED course. Hospitalization was considered for further testing, treatments, or serial exams/observation. However as patient is well-appearing, has a stable exam, and reassuring studies today, I do not feel that they warrant admission at this time. This plan was discussed with the patient who verbalizes agreement and comfort with this plan and seems reliable and able to return to the Emergency Department with worsening or changing symptoms.          Final Clinical Impression(s) / ED Diagnoses Final  diagnoses:  Epistaxis  Generalized weakness  Thrombopenia [facility])    Rx / DC Orders ED Discharge Orders     None         Renne Crigler, PA-C 08/14/23 1523

## 2023-08-14 NOTE — ED Triage Notes (Signed)
Pt stated

## 2023-08-14 NOTE — ED Provider Triage Note (Cosign Needed)
Emergency Medicine Provider Triage Evaluation Note  Sheila Hanson , a 89 y.o. female  was evaluated in triage.  Pt complains of nosebleed and swelling.  Patient reports history of cirrhosis and thrombocytopenia.  She has been having intermittent nosebleeds controlled at home with pressure.  This morning she had a more significant nosebleed that was poorly controlled.  She states that she had to pack her nose twice to help the bleeding stopped.  Patient also reports mildly increased bilateral lower extremity swelling and abdominal swelling.  She has noted some new skin redness and rashes on her lower extremity.  She does endorse easy bruising to her skin.  She has not noted any blood in the stool, urine, or have been coughing up any blood.  She has had "bronchitis" for about a week.  Review of Systems  Positive: Swelling, epistaxis Negative: Fever  Physical Exam  BP (!) 124/54 (BP Location: Right Arm)   Pulse 73   Temp 98.2 F (36.8 C)   Resp 15   Ht 5\' 2"  (1.575 m)   Wt 67.1 kg   SpO2 100%   BMI 27.07 kg/m  Gen:   Awake, no distress   Resp:  Normal effort  MSK:   Moves extremities without difficulty  Other:  Very light petechiae noted over the lower extremities with pitting edema, no active epistaxis  Medical Decision Making  Medically screening exam initiated at 10:54 AM.  Appropriate orders placed.  Sheila Hanson was informed that the remainder of the evaluation will be completed by another provider, this initial triage assessment does not replace that evaluation, and the importance of remaining in the ED until their evaluation is complete.     Sheila Crigler, PA-C 08/14/23 1056

## 2023-08-14 NOTE — Discharge Instructions (Signed)
Please read and follow all provided instructions.  Your diagnoses today include:  1. Epistaxis   2. Generalized weakness   3. Thrombopenia (HCC)     Tests performed today include: Complete blood cell count: Hemoglobin and platelets are stable Basic metabolic panel: Blood sugar was elevated Hepatic function panel and low proteins INR: 1.3 COVID, flu, RSV testing: Was negative Urinalysis (urine test): No infection Chest x-ray: Was clear, no pneumonia seen Vital signs. See below for your results today.   Medications prescribed:  None  Take any prescribed medications only as directed.  Home care instructions:  Follow any educational materials contained in this packet.  BE VERY CAREFUL not to take multiple medicines containing Tylenol (also called acetaminophen). Doing so can lead to an overdose which can damage your liver and cause liver failure and possibly death.   Follow-up instructions: Please follow-up with your primary care provider in the next 3 days for further evaluation of your symptoms.  Follow-up with with gastroenterology tomorrow as planned.  Return instructions:  Please return to the Emergency Department if you experience worsening symptoms.  Please return if you have any other emergent concerns.  Additional Information:  Your vital signs today were: BP 105/71   Pulse 78   Temp 98.2 F (36.8 C)   Resp 13   Ht 5\' 2"  (1.575 m)   Wt 67.1 kg   SpO2 100%   BMI 27.07 kg/m  If your blood pressure (BP) was elevated above 135/85 this visit, please have this repeated by your doctor within one month. --------------

## 2023-08-15 DIAGNOSIS — K7469 Other cirrhosis of liver: Secondary | ICD-10-CM | POA: Diagnosis not present

## 2023-08-19 ENCOUNTER — Ambulatory Visit (INDEPENDENT_AMBULATORY_CARE_PROVIDER_SITE_OTHER): Payer: Medicare Other | Admitting: Podiatry

## 2023-08-19 ENCOUNTER — Encounter: Payer: Self-pay | Admitting: Podiatry

## 2023-08-19 VITALS — Ht 62.0 in | Wt 148.0 lb

## 2023-08-19 DIAGNOSIS — E1159 Type 2 diabetes mellitus with other circulatory complications: Secondary | ICD-10-CM | POA: Diagnosis not present

## 2023-08-19 DIAGNOSIS — B351 Tinea unguium: Secondary | ICD-10-CM | POA: Diagnosis not present

## 2023-08-19 DIAGNOSIS — M79675 Pain in left toe(s): Secondary | ICD-10-CM | POA: Diagnosis not present

## 2023-08-19 DIAGNOSIS — M79674 Pain in right toe(s): Secondary | ICD-10-CM | POA: Diagnosis not present

## 2023-08-19 NOTE — Progress Notes (Signed)
This patient presents to the office with chief complaint of long thick nails and diabetic feet.  This patient  says there  is  no pain and discomfort in her feet.  This patient says there are long thick painful nails.  These nails are painful walking and wearing shoes.  Patient has no history of infection or drainage from both feet.  Patient is unable to  self treat his own nails .This patient has cancer in her liver which has caused diabetes. This patient presents  to the office today for treatment of the  long nails and a foot evaluation due to history of  diabetes.  General Appearance  Alert, conversant and in no acute stress.  Vascular  Dorsalis pedis and posterior tibial  pulses are weakly palpable  right.  Absent pulses left foot.   Capillary return is within normal limits  bilaterally. Temperature is within normal limits  bilaterally.  Neurologic  Senn-Weinstein monofilament wire test within normal limits  bilaterally. Muscle power within normal limits bilaterally.  Nails Thick disfigured discolored nails with subungual debris  from hallux to fifth toes bilaterally. No evidence of bacterial infection or drainage bilaterally.  Orthopedic  No limitations of motion of motion feet .  No crepitus or effusions noted.  No bony pathology or digital deformities noted. Midfoot DJD  B/L.  Skin  normotropic skin with no porokeratosis noted bilaterally.  No signs of infections or ulcers noted.     Onychomycosis  Diabetes with vascular pathology.  Debride nails with nail nipper and dremel tool.    RTC 3 months.   Gardiner Barefoot DPM

## 2023-08-22 ENCOUNTER — Other Ambulatory Visit: Payer: Self-pay | Admitting: Internal Medicine

## 2023-08-22 DIAGNOSIS — Z1231 Encounter for screening mammogram for malignant neoplasm of breast: Secondary | ICD-10-CM

## 2023-08-31 DIAGNOSIS — E113292 Type 2 diabetes mellitus with mild nonproliferative diabetic retinopathy without macular edema, left eye: Secondary | ICD-10-CM | POA: Diagnosis not present

## 2023-08-31 DIAGNOSIS — H353221 Exudative age-related macular degeneration, left eye, with active choroidal neovascularization: Secondary | ICD-10-CM | POA: Diagnosis not present

## 2023-08-31 DIAGNOSIS — H35033 Hypertensive retinopathy, bilateral: Secondary | ICD-10-CM | POA: Diagnosis not present

## 2023-08-31 DIAGNOSIS — Z961 Presence of intraocular lens: Secondary | ICD-10-CM | POA: Diagnosis not present

## 2023-08-31 DIAGNOSIS — H353111 Nonexudative age-related macular degeneration, right eye, early dry stage: Secondary | ICD-10-CM | POA: Diagnosis not present

## 2023-09-07 DIAGNOSIS — H353112 Nonexudative age-related macular degeneration, right eye, intermediate dry stage: Secondary | ICD-10-CM | POA: Diagnosis not present

## 2023-09-07 DIAGNOSIS — H43391 Other vitreous opacities, right eye: Secondary | ICD-10-CM | POA: Diagnosis not present

## 2023-09-07 DIAGNOSIS — H35033 Hypertensive retinopathy, bilateral: Secondary | ICD-10-CM | POA: Diagnosis not present

## 2023-09-07 DIAGNOSIS — H353221 Exudative age-related macular degeneration, left eye, with active choroidal neovascularization: Secondary | ICD-10-CM | POA: Diagnosis not present

## 2023-09-07 DIAGNOSIS — H43813 Vitreous degeneration, bilateral: Secondary | ICD-10-CM | POA: Diagnosis not present

## 2023-09-15 DIAGNOSIS — H353221 Exudative age-related macular degeneration, left eye, with active choroidal neovascularization: Secondary | ICD-10-CM | POA: Diagnosis not present

## 2023-09-26 ENCOUNTER — Ambulatory Visit
Admission: RE | Admit: 2023-09-26 | Discharge: 2023-09-26 | Disposition: A | Discharge status: Home / Self Care | Payer: Medicare Other | Source: Ambulatory Visit | Attending: Internal Medicine | Admitting: Internal Medicine

## 2023-09-26 DIAGNOSIS — Z1231 Encounter for screening mammogram for malignant neoplasm of breast: Secondary | ICD-10-CM

## 2023-10-13 DIAGNOSIS — H43391 Other vitreous opacities, right eye: Secondary | ICD-10-CM | POA: Diagnosis not present

## 2023-10-13 DIAGNOSIS — H353112 Nonexudative age-related macular degeneration, right eye, intermediate dry stage: Secondary | ICD-10-CM | POA: Diagnosis not present

## 2023-10-13 DIAGNOSIS — H353221 Exudative age-related macular degeneration, left eye, with active choroidal neovascularization: Secondary | ICD-10-CM | POA: Diagnosis not present

## 2023-10-13 DIAGNOSIS — H35033 Hypertensive retinopathy, bilateral: Secondary | ICD-10-CM | POA: Diagnosis not present

## 2023-10-13 DIAGNOSIS — H43813 Vitreous degeneration, bilateral: Secondary | ICD-10-CM | POA: Diagnosis not present

## 2023-10-20 DIAGNOSIS — E1140 Type 2 diabetes mellitus with diabetic neuropathy, unspecified: Secondary | ICD-10-CM | POA: Diagnosis not present

## 2023-10-20 DIAGNOSIS — R109 Unspecified abdominal pain: Secondary | ICD-10-CM | POA: Diagnosis not present

## 2023-10-20 DIAGNOSIS — E1165 Type 2 diabetes mellitus with hyperglycemia: Secondary | ICD-10-CM | POA: Diagnosis not present

## 2023-10-20 DIAGNOSIS — K7469 Other cirrhosis of liver: Secondary | ICD-10-CM | POA: Diagnosis not present

## 2023-10-28 DIAGNOSIS — R109 Unspecified abdominal pain: Secondary | ICD-10-CM | POA: Diagnosis not present

## 2023-10-28 DIAGNOSIS — K769 Liver disease, unspecified: Secondary | ICD-10-CM | POA: Diagnosis not present

## 2023-10-28 DIAGNOSIS — R161 Splenomegaly, not elsewhere classified: Secondary | ICD-10-CM | POA: Diagnosis not present

## 2023-10-28 DIAGNOSIS — I714 Abdominal aortic aneurysm, without rupture, unspecified: Secondary | ICD-10-CM | POA: Diagnosis not present

## 2023-10-28 DIAGNOSIS — K7460 Unspecified cirrhosis of liver: Secondary | ICD-10-CM | POA: Diagnosis not present

## 2023-11-03 DIAGNOSIS — R1012 Left upper quadrant pain: Secondary | ICD-10-CM | POA: Diagnosis not present

## 2023-11-03 DIAGNOSIS — K7290 Hepatic failure, unspecified without coma: Secondary | ICD-10-CM | POA: Diagnosis not present

## 2023-11-03 DIAGNOSIS — K8020 Calculus of gallbladder without cholecystitis without obstruction: Secondary | ICD-10-CM | POA: Diagnosis not present

## 2023-11-03 DIAGNOSIS — K7460 Unspecified cirrhosis of liver: Secondary | ICD-10-CM | POA: Diagnosis not present

## 2023-11-03 DIAGNOSIS — R0781 Pleurodynia: Secondary | ICD-10-CM | POA: Diagnosis not present

## 2023-11-03 DIAGNOSIS — K219 Gastro-esophageal reflux disease without esophagitis: Secondary | ICD-10-CM | POA: Diagnosis not present

## 2023-11-04 ENCOUNTER — Other Ambulatory Visit: Payer: Self-pay | Admitting: Gastroenterology

## 2023-11-04 DIAGNOSIS — R1012 Left upper quadrant pain: Secondary | ICD-10-CM

## 2023-11-04 DIAGNOSIS — R0781 Pleurodynia: Secondary | ICD-10-CM

## 2023-11-04 DIAGNOSIS — K8020 Calculus of gallbladder without cholecystitis without obstruction: Secondary | ICD-10-CM

## 2023-11-04 DIAGNOSIS — K2100 Gastro-esophageal reflux disease with esophagitis, without bleeding: Secondary | ICD-10-CM

## 2023-11-04 DIAGNOSIS — K7460 Unspecified cirrhosis of liver: Secondary | ICD-10-CM

## 2023-11-08 ENCOUNTER — Telehealth: Payer: Self-pay

## 2023-11-08 MED ORDER — PREDNISONE 50 MG PO TABS: ORAL_TABLET | ORAL | 0 refills | Status: AC

## 2023-11-08 MED ORDER — DIPHENHYDRAMINE HCL 50 MG PO TABS: ORAL_TABLET | ORAL | 0 refills | Status: AC

## 2023-11-08 MED ORDER — DIPHENHYDRAMINE HCL 50 MG PO TABS: ORAL_TABLET | ORAL | 0 refills | Status: DC

## 2023-11-08 MED ORDER — PREDNISONE 50 MG PO TABS: ORAL_TABLET | ORAL | 0 refills | Status: DC

## 2023-11-10 ENCOUNTER — Ambulatory Visit
Admission: RE | Admit: 2023-11-10 | Discharge: 2023-11-10 | Disposition: A | Discharge status: Home / Self Care | Source: Ambulatory Visit | Attending: Gastroenterology | Admitting: Gastroenterology

## 2023-11-10 DIAGNOSIS — R109 Unspecified abdominal pain: Secondary | ICD-10-CM | POA: Diagnosis not present

## 2023-11-10 DIAGNOSIS — R197 Diarrhea, unspecified: Secondary | ICD-10-CM | POA: Diagnosis not present

## 2023-11-10 DIAGNOSIS — K8020 Calculus of gallbladder without cholecystitis without obstruction: Secondary | ICD-10-CM | POA: Diagnosis not present

## 2023-11-10 DIAGNOSIS — K7460 Unspecified cirrhosis of liver: Secondary | ICD-10-CM

## 2023-11-10 DIAGNOSIS — R0781 Pleurodynia: Secondary | ICD-10-CM

## 2023-11-10 DIAGNOSIS — K2100 Gastro-esophageal reflux disease with esophagitis, without bleeding: Secondary | ICD-10-CM

## 2023-11-10 DIAGNOSIS — R1012 Left upper quadrant pain: Secondary | ICD-10-CM

## 2023-11-17 ENCOUNTER — Ambulatory Visit (INDEPENDENT_AMBULATORY_CARE_PROVIDER_SITE_OTHER): Payer: Medicare Other | Admitting: Podiatry

## 2023-11-17 ENCOUNTER — Encounter: Payer: Self-pay | Admitting: Podiatry

## 2023-11-17 DIAGNOSIS — B351 Tinea unguium: Secondary | ICD-10-CM

## 2023-11-17 DIAGNOSIS — M79674 Pain in right toe(s): Secondary | ICD-10-CM

## 2023-11-17 DIAGNOSIS — E1159 Type 2 diabetes mellitus with other circulatory complications: Secondary | ICD-10-CM

## 2023-11-17 DIAGNOSIS — M79675 Pain in left toe(s): Secondary | ICD-10-CM

## 2023-11-17 NOTE — Progress Notes (Signed)
 This patient returns to my office for at risk foot care.  This patient requires this care by a professional since this patient will be at risk due to having  diabetes.  This patient is unable to cut nails herself since the patient cannot reach her nails.These nails are painful walking and wearing shoes.  This patient presents for at risk foot care today.  General Appearance  Alert, conversant and in no acute stress.  Vascular  Dorsalis pedis and posterior tibial  pulses are  weakly palpable B/L Capillary return is within normal limits  bilaterally. Temperature is within normal limits  bilaterally.  Neurologic  Senn-Weinstein monofilament wire test within normal limits  bilaterally. Muscle power within normal limits bilaterally.  Nails Thick disfigured discolored nails with subungual debris  from hallux to fifth toes bilaterally. No evidence of bacterial infection or drainage bilaterally.  Orthopedic  No limitations of motion  feet .  No crepitus or effusions noted.  No bony pathology or digital deformities noted.  Skin  normotropic skin with no porokeratosis noted bilaterally.  No signs of infections or ulcers noted.     Onychomycosis  Pain in right toes  Pain in left toes  Consent was obtained for treatment procedures.   Mechanical debridement of nails 1-5  bilaterally performed with a nail nipper.  Filed with dremel without incident.    Return office visit   3 months                   Told patient to return for periodic foot care and evaluation due to potential at risk complications.   Helane Gunther DPM

## 2023-11-24 DIAGNOSIS — H43813 Vitreous degeneration, bilateral: Secondary | ICD-10-CM | POA: Diagnosis not present

## 2023-11-24 DIAGNOSIS — H353112 Nonexudative age-related macular degeneration, right eye, intermediate dry stage: Secondary | ICD-10-CM | POA: Diagnosis not present

## 2023-11-24 DIAGNOSIS — H353221 Exudative age-related macular degeneration, left eye, with active choroidal neovascularization: Secondary | ICD-10-CM | POA: Diagnosis not present

## 2023-11-24 DIAGNOSIS — H35033 Hypertensive retinopathy, bilateral: Secondary | ICD-10-CM | POA: Diagnosis not present

## 2023-11-24 DIAGNOSIS — H43391 Other vitreous opacities, right eye: Secondary | ICD-10-CM | POA: Diagnosis not present

## 2023-11-28 DIAGNOSIS — K7460 Unspecified cirrhosis of liver: Secondary | ICD-10-CM | POA: Diagnosis not present

## 2023-11-28 DIAGNOSIS — R109 Unspecified abdominal pain: Secondary | ICD-10-CM | POA: Diagnosis not present

## 2023-11-29 ENCOUNTER — Other Ambulatory Visit: Payer: Self-pay | Admitting: Gastroenterology

## 2023-11-29 DIAGNOSIS — R109 Unspecified abdominal pain: Secondary | ICD-10-CM

## 2023-12-08 DIAGNOSIS — K7460 Unspecified cirrhosis of liver: Secondary | ICD-10-CM | POA: Diagnosis not present

## 2023-12-08 DIAGNOSIS — F17210 Nicotine dependence, cigarettes, uncomplicated: Secondary | ICD-10-CM | POA: Diagnosis not present

## 2023-12-08 DIAGNOSIS — K766 Portal hypertension: Secondary | ICD-10-CM | POA: Diagnosis not present

## 2023-12-08 DIAGNOSIS — K7581 Nonalcoholic steatohepatitis (NASH): Secondary | ICD-10-CM | POA: Diagnosis not present

## 2023-12-08 DIAGNOSIS — R188 Other ascites: Secondary | ICD-10-CM | POA: Diagnosis not present

## 2023-12-08 DIAGNOSIS — R161 Splenomegaly, not elsewhere classified: Secondary | ICD-10-CM | POA: Diagnosis not present

## 2023-12-08 DIAGNOSIS — R978 Other abnormal tumor markers: Secondary | ICD-10-CM | POA: Diagnosis not present

## 2023-12-08 DIAGNOSIS — K8020 Calculus of gallbladder without cholecystitis without obstruction: Secondary | ICD-10-CM | POA: Diagnosis not present

## 2023-12-08 DIAGNOSIS — Z79899 Other long term (current) drug therapy: Secondary | ICD-10-CM | POA: Diagnosis not present

## 2023-12-16 ENCOUNTER — Ambulatory Visit
Admission: RE | Admit: 2023-12-16 | Discharge: 2023-12-16 | Disposition: A | Discharge status: Home / Self Care | Source: Ambulatory Visit | Attending: Gastroenterology

## 2023-12-16 DIAGNOSIS — K8020 Calculus of gallbladder without cholecystitis without obstruction: Secondary | ICD-10-CM | POA: Diagnosis not present

## 2023-12-16 DIAGNOSIS — K766 Portal hypertension: Secondary | ICD-10-CM | POA: Diagnosis not present

## 2023-12-16 DIAGNOSIS — R109 Unspecified abdominal pain: Secondary | ICD-10-CM

## 2023-12-16 DIAGNOSIS — K7460 Unspecified cirrhosis of liver: Secondary | ICD-10-CM | POA: Diagnosis not present

## 2023-12-16 MED ORDER — GADOPICLENOL 0.5 MMOL/ML IV SOLN
7.0000 mL | Freq: Once | INTRAVENOUS | Status: AC | PRN
  Administered 2023-12-16: 7 mL via INTRAVENOUS

## 2023-12-23 ENCOUNTER — Encounter: Payer: Self-pay | Admitting: Obstetrics & Gynecology

## 2023-12-26 ENCOUNTER — Encounter: Payer: Self-pay | Admitting: Nephrology

## 2024-01-06 DIAGNOSIS — R109 Unspecified abdominal pain: Secondary | ICD-10-CM | POA: Diagnosis not present

## 2024-01-09 ENCOUNTER — Encounter (HOSPITAL_BASED_OUTPATIENT_CLINIC_OR_DEPARTMENT_OTHER): Payer: Self-pay

## 2024-01-09 ENCOUNTER — Other Ambulatory Visit: Payer: Self-pay

## 2024-01-09 ENCOUNTER — Emergency Department (HOSPITAL_BASED_OUTPATIENT_CLINIC_OR_DEPARTMENT_OTHER)
Admission: EM | Admit: 2024-01-09 | Discharge: 2024-01-09 | Disposition: A | Discharge status: Home / Self Care | Attending: Emergency Medicine | Admitting: Emergency Medicine

## 2024-01-09 ENCOUNTER — Emergency Department (HOSPITAL_BASED_OUTPATIENT_CLINIC_OR_DEPARTMENT_OTHER)

## 2024-01-09 DIAGNOSIS — R109 Unspecified abdominal pain: Secondary | ICD-10-CM

## 2024-01-09 DIAGNOSIS — E119 Type 2 diabetes mellitus without complications: Secondary | ICD-10-CM | POA: Insufficient documentation

## 2024-01-09 DIAGNOSIS — I10 Essential (primary) hypertension: Secondary | ICD-10-CM | POA: Diagnosis not present

## 2024-01-09 DIAGNOSIS — R110 Nausea: Secondary | ICD-10-CM | POA: Insufficient documentation

## 2024-01-09 DIAGNOSIS — Z7984 Long term (current) use of oral hypoglycemic drugs: Secondary | ICD-10-CM | POA: Insufficient documentation

## 2024-01-09 DIAGNOSIS — I700 Atherosclerosis of aorta: Secondary | ICD-10-CM | POA: Diagnosis not present

## 2024-01-09 DIAGNOSIS — R1084 Generalized abdominal pain: Secondary | ICD-10-CM | POA: Insufficient documentation

## 2024-01-09 DIAGNOSIS — R932 Abnormal findings on diagnostic imaging of liver and biliary tract: Secondary | ICD-10-CM | POA: Diagnosis not present

## 2024-01-09 DIAGNOSIS — K7469 Other cirrhosis of liver: Secondary | ICD-10-CM | POA: Diagnosis not present

## 2024-01-09 DIAGNOSIS — Z79899 Other long term (current) drug therapy: Secondary | ICD-10-CM | POA: Insufficient documentation

## 2024-01-09 DIAGNOSIS — M47816 Spondylosis without myelopathy or radiculopathy, lumbar region: Secondary | ICD-10-CM | POA: Diagnosis not present

## 2024-01-09 DIAGNOSIS — K5900 Constipation, unspecified: Secondary | ICD-10-CM | POA: Diagnosis not present

## 2024-01-09 DIAGNOSIS — K8020 Calculus of gallbladder without cholecystitis without obstruction: Secondary | ICD-10-CM | POA: Diagnosis not present

## 2024-01-09 LAB — CBC WITH DIFFERENTIAL/PLATELET
Abs Immature Granulocytes: 0.01 10*3/uL (ref 0.00–0.07)
Basophils Absolute: 0 10*3/uL (ref 0.0–0.1)
Basophils Relative: 0 %
Eosinophils Absolute: 0 10*3/uL (ref 0.0–0.5)
Eosinophils Relative: 1 %
HCT: 35.3 % — ABNORMAL LOW (ref 36.0–46.0)
Hemoglobin: 12 g/dL (ref 12.0–15.0)
Immature Granulocytes: 0 %
Lymphocytes Relative: 22 %
Lymphs Abs: 0.5 10*3/uL — ABNORMAL LOW (ref 0.7–4.0)
MCH: 32.4 pg (ref 26.0–34.0)
MCHC: 34 g/dL (ref 30.0–36.0)
MCV: 95.4 fL (ref 80.0–100.0)
Monocytes Absolute: 0.3 10*3/uL (ref 0.1–1.0)
Monocytes Relative: 14 %
Neutro Abs: 1.4 10*3/uL — ABNORMAL LOW (ref 1.7–7.7)
Neutrophils Relative %: 63 %
Platelets: 41 10*3/uL — ABNORMAL LOW (ref 150–400)
RBC: 3.7 MIL/uL — ABNORMAL LOW (ref 3.87–5.11)
RDW: 14.8 % (ref 11.5–15.5)
WBC: 2.3 10*3/uL — ABNORMAL LOW (ref 4.0–10.5)
nRBC: 0 % (ref 0.0–0.2)

## 2024-01-09 LAB — COMPREHENSIVE METABOLIC PANEL WITH GFR
ALT: 20 U/L (ref 0–44)
AST: 34 U/L (ref 15–41)
Albumin: 3.5 g/dL (ref 3.5–5.0)
Alkaline Phosphatase: 114 U/L (ref 38–126)
Anion gap: 12 (ref 5–15)
BUN: 14 mg/dL (ref 8–23)
CO2: 22 mmol/L (ref 22–32)
Calcium: 8.9 mg/dL (ref 8.9–10.3)
Chloride: 103 mmol/L (ref 98–111)
Creatinine, Ser: 0.54 mg/dL (ref 0.44–1.00)
GFR, Estimated: >60 mL/min (ref >60)
Glucose, Bld: 151 mg/dL — ABNORMAL HIGH (ref 70–99)
Potassium: 3.9 mmol/L (ref 3.5–5.1)
Sodium: 137 mmol/L (ref 135–145)
Total Bilirubin: 1.7 mg/dL — ABNORMAL HIGH (ref 0.0–1.2)
Total Protein: 6 g/dL — ABNORMAL LOW (ref 6.5–8.1)

## 2024-01-09 LAB — URINALYSIS, ROUTINE W REFLEX MICROSCOPIC
Bilirubin Urine: NEGATIVE
Glucose, UA: NEGATIVE mg/dL
Hgb urine dipstick: NEGATIVE
Ketones, ur: NEGATIVE mg/dL
Leukocytes,Ua: NEGATIVE
Nitrite: NEGATIVE
Protein, ur: NEGATIVE mg/dL
Specific Gravity, Urine: 1.015 (ref 1.005–1.030)
pH: 7 (ref 5.0–8.0)

## 2024-01-09 MED ORDER — TRAMADOL HCL 50 MG PO TABS: 50.0000 mg | ORAL_TABLET | Freq: Four times a day (QID) | ORAL | 0 refills | Status: AC | PRN

## 2024-01-09 MED ORDER — TRAMADOL HCL 25 MG PO TABS: 1.0000 | ORAL_TABLET | Freq: Four times a day (QID) | ORAL | 0 refills | Status: DC | PRN

## 2024-01-09 NOTE — ED Triage Notes (Signed)
 Pt reports right sided flank pain X 5 weeks. Reports she has been to the doc multiple times and had US  and MRI done but  can't find what is wrong. Denies urinary symptoms but does endorses nausea.

## 2024-01-09 NOTE — Discharge Instructions (Signed)
 We were unable to find a cause for your pain.  We did send a prescription for tramadol to your pharmacy.  Take 1 tablet at bedtime as needed for pain.  If you have any side effects to this medication, stop taking it. I recommend following up with your primary care doctor and GI specialists to follow-up on this pain.

## 2024-01-09 NOTE — ED Provider Notes (Signed)
 [place] EMERGENCY DE[place]MENT AT [facility] [place] Provider Note   CSN: [id number] Arrival date & time: 01/09/24  7846     History cirrhosis d/t MASH, T2DM, HLD, GERD, HTN Chief Complaint  Patient presents with   Flank Pain    Sheila Hanson is a 89 y.o. female.  Pt reports flank pain since 10/15/2023, states it was originally on L side but is now only the R side which started within the last month. Notes occasional nausea but no vomiting and is eating and drinking normally.  Pain is worse when trying to fall asleep, has a hard time getting comfortable. Not worse with any certain movements.  States the pain is calm down since last night is not bad enough to pain medications at this time. No fevers, no dysuria, no abdominal pain. Urinated normally this morning but does note less urination yesterday Has diarrhea which is lactulose induced to treat cirrhosis. No blood in stool.    Had previous imaging to work up pain -  CT abdomen pelvis 11/10/23 and MR abdomen 12/16/2023 both showing cholelithiasis without complications and known cirrhosis with portal HTN. No acute findings to explain pain.  Last saw GI with [facility] on 12/08/2023 with plans to f/u in 6 months.    Flank Pain      Home Medications Prior to Admission medications   Medication Sig Start Date End Date Taking? Authorizing Provider  cholecalciferol (VITAMIN D3) 25 MCG (1000 UNIT) tablet Take 2,000 Units by mouth daily.    [provider]  diphenhydrAMINE  (BENADRYL ) 50 MG tablet Pt to take 50 mg of prednisone  on 11/09/23 at 9:40 pm, 50 mg of prednisone  on 11/10/23 at 3:40 am, and 50 mg of prednisone  on 11/10/23 at 9:40 am. Pt is also to take 50 mg of benadryl  on 11/10/23 at 9:40 am. Please call [phone] with any questions. Allergy/sensitivity addressed by Dr. Ardelle Beavers 11/08/23   Aleta Anda, MD  Docusate Sodium (COLACE PO) Take 2 capsules by mouth at bedtime.    [provider]  estradiol (ESTRACE) 0.5 MG  tablet Take 0.5 mg by mouth daily. 05/05/21   [provider]  ferrous sulfate 325 (65 FE) MG tablet Take 325 mg by mouth daily with breakfast.    [provider]  furosemide (LASIX) 20 MG tablet Take 20 mg by mouth daily. 01/18/22   [provider]  lactulose (CHRONULAC) 10 GM/15ML solution Take 10 g by mouth daily. Taking 30 ml daily 01/18/22   [provider]  loratadine (CLARITIN) 10 MG tablet Take 10 mg by mouth daily.      [provider]  metFORMIN (GLUCOPHAGE) 500 MG tablet Take by mouth daily with breakfast.    [provider]  pantoprazole (PROTONIX) 20 MG tablet Take 20 mg by mouth daily. 01/22/21   [provider]  predniSONE  (DELTASONE ) 50 MG tablet Pt to take 50 mg of prednisone  on 11/09/23 at 9:40 pm, 50 mg of prednisone  on 11/10/23 at 3:40 am, and 50 mg of prednisone  on 11/10/23 at 9:40 am. Pt is also to take 50 mg of benadryl  on 11/10/23 at 9:40 am. Please call [phone] with any questions. Allergy/sensitivity addressed by Dr. Ardelle Beavers 11/08/23   Aleta Anda, MD  spironolactone (ALDACTONE) 25 MG tablet Take 25 mg by mouth every morning. 01/18/22   [provider]      Allergies    Other, Penicillins, Codeine sulfate, Diphenhydramine  hcl, Diphenhydramine  hcl, Iohexol , Oxycodone, Oxycodone-acetaminophen, Percocet [oxycodone-acetaminophen], Shellfish-derived products,  Azithromycin, Methocarbamol, and Vancomycin    Review of Systems   Review of Systems  Genitourinary:  Positive for flank pain.    Physical Exam Updated Vital Signs BP (!) 139/58 (BP Location: Right Arm)   Pulse 81   Temp 98 F (36.7 C) (Oral)   Resp 17   SpO2 100%  Physical Exam Constitutional:      General: She is not in acute distress.    Appearance: She is not ill-appearing.  HENT:     Mouth/Throat:     Mouth: Mucous membranes are moist.     Pharynx: Oropharynx is clear. No oropharyngeal exudate or posterior oropharyngeal erythema.  Eyes:      Conjunctiva/sclera: Conjunctivae normal.  Cardiovascular:     Rate and Rhythm: Normal rate and regular rhythm.     Heart sounds: No murmur heard. Pulmonary:     Effort: Pulmonary effort is normal. No respiratory distress.     Breath sounds: Normal breath sounds.  Abdominal:     General: Abdomen is flat. Bowel sounds are normal. There is no distension.     Palpations: Abdomen is soft.     Tenderness: There is no abdominal tenderness.     Comments: Bilateral CVA tenderness present  Musculoskeletal:     Cervical back: Neck supple.  Neurological:     Mental Status: She is alert.     ED Results / Procedures / Treatments   Labs (all labs ordered are listed, but only abnormal results are displayed) Labs Reviewed  URINALYSIS, ROUTINE W REFLEX MICROSCOPIC    EKG None  Radiology No results found.  Procedures Procedures  None  Medications Ordered in ED Medications - No data to display  ED Course/ Medical Decision Making/ A&P                                Medical Decision Making 89 year old female with PMH cirrhosis due to NASH, HTN, GERD, HLD with flank pain for the past 3 months, originally on the left side but has now migrated to the right side.  Previous imaging as noted in HPI has been unremarkable.  Exam notable for bilateral flank tenderness and mild diffuse abdominal tenderness without guarding. Vitals are stable. CBC with differential showing mild leukopenia to 2.3 with ANC 1.4, thrombocytopenia to 41 (around baseline), CMP unremarkable. UA normal with no evidence of UTI. CT L-spine showing no fracture, Renal stone CT showing cholelithiasis without evidence of complication, known cirrhosis with portal hypertension, previously noted 2.8 cm hypodense right hepatic lesion, trace simple ascites.   ED workup reveals no obvious etiology for her flank pain.  No spinal fracture, kidney stone, kidney mass, hydronephrosis on imaging.  No evidence of infection on  labs. Could possibly be MSK in nature though no specific motions provoke the pain. Patient remained stable throughout ED visit did not require admission.  She was discharged with Rx tramadol to take prior to bedtime with sleep and follow-up with PCP for further evaluation.   Amount and/or Complexity of Data Reviewed Labs: ordered. Radiology: ordered.  Risk Prescription drug management.    Final Clinical Impression(s) / ED Diagnoses Final diagnoses:  None    Rx / DC Orders ED Discharge Orders     None         Glenn Lange, DO 01/09/24 1437    Hershel Los, MD 01/09/24 1441

## 2024-01-10 DIAGNOSIS — H906 Mixed conductive and sensorineural hearing loss, bilateral: Secondary | ICD-10-CM | POA: Diagnosis not present

## 2024-01-10 DIAGNOSIS — E119 Type 2 diabetes mellitus without complications: Secondary | ICD-10-CM | POA: Diagnosis not present

## 2024-01-10 NOTE — ED Notes (Signed)
 [facility] called needing Primary Physican's name from 01/09/2023 for script.  Script sent with Residents information.

## 2024-01-15 ENCOUNTER — Emergency Department (HOSPITAL_BASED_OUTPATIENT_CLINIC_OR_DEPARTMENT_OTHER)

## 2024-01-15 ENCOUNTER — Emergency Department (HOSPITAL_BASED_OUTPATIENT_CLINIC_OR_DEPARTMENT_OTHER)
Admission: EM | Admit: 2024-01-15 | Discharge: 2024-01-15 | Disposition: A | Discharge status: Home / Self Care | Attending: Emergency Medicine | Admitting: Emergency Medicine

## 2024-01-15 ENCOUNTER — Other Ambulatory Visit: Payer: Self-pay

## 2024-01-15 ENCOUNTER — Encounter (HOSPITAL_BASED_OUTPATIENT_CLINIC_OR_DEPARTMENT_OTHER): Payer: Self-pay

## 2024-01-15 DIAGNOSIS — R109 Unspecified abdominal pain: Secondary | ICD-10-CM | POA: Diagnosis present

## 2024-01-15 DIAGNOSIS — K7460 Unspecified cirrhosis of liver: Secondary | ICD-10-CM | POA: Diagnosis not present

## 2024-01-15 DIAGNOSIS — M5459 Other low back pain: Secondary | ICD-10-CM | POA: Diagnosis not present

## 2024-01-15 DIAGNOSIS — M545 Low back pain, unspecified: Secondary | ICD-10-CM | POA: Insufficient documentation

## 2024-01-15 DIAGNOSIS — K8020 Calculus of gallbladder without cholecystitis without obstruction: Secondary | ICD-10-CM | POA: Diagnosis not present

## 2024-01-15 DIAGNOSIS — K766 Portal hypertension: Secondary | ICD-10-CM | POA: Diagnosis not present

## 2024-01-15 DIAGNOSIS — R161 Splenomegaly, not elsewhere classified: Secondary | ICD-10-CM | POA: Diagnosis not present

## 2024-01-15 LAB — URINALYSIS, ROUTINE W REFLEX MICROSCOPIC
Glucose, UA: NEGATIVE mg/dL
Hgb urine dipstick: NEGATIVE
Ketones, ur: 15 mg/dL — AB
Leukocytes,Ua: NEGATIVE
Nitrite: NEGATIVE
Protein, ur: 30 mg/dL — AB
Specific Gravity, Urine: 1.025 (ref 1.005–1.030)
pH: 6 (ref 5.0–8.0)

## 2024-01-15 LAB — COMPREHENSIVE METABOLIC PANEL WITH GFR
ALT: 22 U/L (ref 0–44)
AST: 35 U/L (ref 15–41)
Albumin: 3.9 g/dL (ref 3.5–5.0)
Alkaline Phosphatase: 125 U/L (ref 38–126)
Anion gap: 12 (ref 5–15)
BUN: 17 mg/dL (ref 8–23)
CO2: 23 mmol/L (ref 22–32)
Calcium: 9.4 mg/dL (ref 8.9–10.3)
Chloride: 104 mmol/L (ref 98–111)
Creatinine, Ser: 0.63 mg/dL (ref 0.44–1.00)
GFR, Estimated: >60 mL/min (ref >60)
Glucose, Bld: 147 mg/dL — ABNORMAL HIGH (ref 70–99)
Potassium: 3.9 mmol/L (ref 3.5–5.1)
Sodium: 139 mmol/L (ref 135–145)
Total Bilirubin: 2.2 mg/dL — ABNORMAL HIGH (ref 0.0–1.2)
Total Protein: 6.7 g/dL (ref 6.5–8.1)

## 2024-01-15 LAB — CBC WITH DIFFERENTIAL/PLATELET
Abs Immature Granulocytes: 0 10*3/uL (ref 0.00–0.07)
Basophils Absolute: 0 10*3/uL (ref 0.0–0.1)
Basophils Relative: 1 %
Eosinophils Absolute: 0 10*3/uL (ref 0.0–0.5)
Eosinophils Relative: 1 %
HCT: 38.7 % (ref 36.0–46.0)
Hemoglobin: 13 g/dL (ref 12.0–15.0)
Immature Granulocytes: 0 %
Lymphocytes Relative: 21 %
Lymphs Abs: 0.6 10*3/uL — ABNORMAL LOW (ref 0.7–4.0)
MCH: 32.3 pg (ref 26.0–34.0)
MCHC: 33.6 g/dL (ref 30.0–36.0)
MCV: 96.3 fL (ref 80.0–100.0)
Monocytes Absolute: 0.4 10*3/uL (ref 0.1–1.0)
Monocytes Relative: 14 %
Neutro Abs: 1.8 10*3/uL (ref 1.7–7.7)
Neutrophils Relative %: 63 %
Platelets: 47 10*3/uL — ABNORMAL LOW (ref 150–400)
RBC: 4.02 MIL/uL (ref 3.87–5.11)
RDW: 14.6 % (ref 11.5–15.5)
WBC: 2.9 10*3/uL — ABNORMAL LOW (ref 4.0–10.5)
nRBC: 0 % (ref 0.0–0.2)

## 2024-01-15 LAB — URINALYSIS, MICROSCOPIC (REFLEX): RBC / HPF: NONE SEEN RBC/hpf (ref 0–5)

## 2024-01-15 LAB — AMMONIA: Ammonia: 33 umol/L (ref 9–35)

## 2024-01-15 LAB — LIPASE, BLOOD: Lipase: 20 U/L (ref 11–51)

## 2024-01-15 MED ORDER — METHYLPREDNISOLONE 4 MG PO TBPK: ORAL_TABLET | ORAL | 0 refills | Status: AC

## 2024-01-15 MED ORDER — ONDANSETRON HCL 4 MG/2ML IJ SOLN
4.0000 mg | Freq: Once | INTRAMUSCULAR | Status: AC
  Administered 2024-01-15: 4 mg via INTRAVENOUS
  Filled 2024-01-15: qty 2

## 2024-01-15 MED ORDER — MORPHINE SULFATE (PF) 2 MG/ML IV SOLN
2.0000 mg | Freq: Once | INTRAVENOUS | Status: DC
  Filled 2024-01-15: qty 1

## 2024-01-15 MED ORDER — KETOROLAC TROMETHAMINE 15 MG/ML IJ SOLN
15.0000 mg | Freq: Once | INTRAMUSCULAR | Status: AC
  Administered 2024-01-15: 15 mg via INTRAVENOUS
  Filled 2024-01-15: qty 1

## 2024-01-15 MED ORDER — ACETAMINOPHEN 500 MG PO TABS
1000.0000 mg | ORAL_TABLET | Freq: Once | ORAL | Status: AC
  Administered 2024-01-15: 1000 mg via ORAL
  Filled 2024-01-15: qty 2

## 2024-01-15 MED ORDER — SODIUM CHLORIDE 0.9 % IV BOLUS
1000.0000 mL | Freq: Once | INTRAVENOUS | Status: AC
  Administered 2024-01-15: 1000 mL via INTRAVENOUS

## 2024-01-15 MED ORDER — MORPHINE SULFATE 15 MG PO TABS: 7.5000 mg | ORAL_TABLET | ORAL | 0 refills | Status: AC | PRN

## 2024-01-15 MED ORDER — DICLOFENAC SODIUM 1 % EX GEL: 4.0000 g | Freq: Four times a day (QID) | CUTANEOUS | 0 refills | Status: AC

## 2024-01-15 NOTE — ED Triage Notes (Signed)
 Reports R flank pain since March 1st. Intermittent nausea.   Denies dysuria, hematuria, emesis, fevers

## 2024-01-15 NOTE — ED Notes (Signed)
 Patient transported to CT

## 2024-01-15 NOTE — Discharge Instructions (Signed)
 Your back pain is most likely due to a muscular strain.  There is been a lot of research on back pain, unfortunately the only thing that seems to really help is Tylenol and ibuprofen.  Relative rest is also important to not lift greater than 10 pounds bending or twisting at the waist.  Please follow-up with your family physician.  The other thing that really seems to benefit patients is physical therapy which your doctor may send you for.  Please return to the emergency department for new numbness or weakness to your arms or legs. Difficulty with urinating or urinating or pooping on yourself.  Also if you cannot feel toilet paper when you wipe or get a fever.   Use the gel as prescribed.  Take the steroids as prescribed. Take your Tylenol as suggested by your primary care provider.

## 2024-01-15 NOTE — ED Provider Notes (Signed)
 [place] [facility]MENT AT [facility] Provider Note   CSN: [id number] Arrival date & time: 01/15/24  4098     History  Chief Complaint  Patient presents with   Flank Pain    Sheila Hanson is a 89 y.o. female.  89 yo F with a chief complaints of right flank pain.  This has been going on for some time now.  She has been seen at least once by her PCP and thought to have been seen multiple times by them and has also been seen in the emergency department for this.  She has tried tramadol  with some improvement but ended up spending most the time in bed asleep.  She was noted to be quite confused a couple days ago.  This seems to have improved as well.  She has a history of nonalcoholic cirrhosis.  She takes lactulose regularly but seems to have a bowel movement every few days.  Family is here and is worried that perhaps she has not been taking enough lactulose.  They are worried that her ammonia level could be high.  Patient is concerned about ongoing right sided back pain.  Seems to be worse with twisting turning palpation.  She denies any loss of bowel or bladder denies loss of rectal sensation denies numbness or weakness to the leg.   Flank Pain       Home Medications Prior to Admission medications   Medication Sig Start Date End Date Taking? Authorizing Provider  diclofenac Sodium (VOLTAREN) 1 % GEL Apply 4 g topically 4 (four) times daily. 01/15/24  Yes Albertus Hughs, DO  methylPREDNISolone  (MEDROL  DOSEPAK) 4 MG TBPK tablet Day 1: 8mg  before breakfast, 4 mg after lunch, 4 mg after supper, and 8 mg at bedtime Day 2: 4 mg before breakfast, 4 mg after lunch, 4 mg  after supper, and 8 mg  at bedtime Day 3:  4 mg  before breakfast, 4 mg  after lunch, 4 mg after supper, and 4 mg  at bedtime Day 4: 4 mg  before breakfast, 4 mg  after lunch, and 4 mg at bedtime Day 5: 4 mg  before breakfast and 4 mg at bedtime Day 6: 4 mg  before breakfast 01/15/24  Yes Amarri Michaelson, DO  morphine  (MSIR) 15 MG tablet Take 0.5 tablets (7.5 mg total) by mouth every 4 (four) hours as needed. 01/15/24  Yes Albertus Hughs, DO  cholecalciferol (VITAMIN D3) 25 MCG (1000 UNIT) tablet Take 2,000 Units by mouth daily.    [provider]  diphenhydrAMINE  (BENADRYL ) 50 MG tablet Pt to take 50 mg of prednisone  on 11/09/23 at 9:40 pm, 50 mg of prednisone  on 11/10/23 at 3:40 am, and 50 mg of prednisone  on 11/10/23 at 9:40 am. Pt is also to take 50 mg of benadryl  on 11/10/23 at 9:40 am. Please call [phone] with any questions. Allergy/sensitivity addressed by Dr. Ardelle Beavers 11/08/23   Aleta Anda, MD  Docusate Sodium (COLACE PO) Take 2 capsules by mouth at bedtime.    [provider]  estradiol (ESTRACE) 0.5 MG tablet Take 0.5 mg by mouth daily. 05/05/21   [provider]  ferrous sulfate 325 (65 FE) MG tablet Take 325 mg by mouth daily with breakfast.    [provider]  furosemide (LASIX) 20 MG tablet Take 20 mg by mouth daily. 01/18/22   [provider]  lactulose (CHRONULAC) 10 GM/15ML solution Take 10 g by mouth daily. Taking 30 ml daily 01/18/22  [provider]  loratadine (CLARITIN) 10 MG tablet Take 10 mg by mouth daily.      [provider]  metFORMIN (GLUCOPHAGE) 500 MG tablet Take by mouth daily with breakfast.    [provider]  pantoprazole (PROTONIX) 20 MG tablet Take 20 mg by mouth daily. 01/22/21   [provider]  predniSONE  (DELTASONE ) 50 MG tablet Pt to take 50 mg of prednisone  on 11/09/23 at 9:40 pm, 50 mg of prednisone  on 11/10/23 at 3:40 am, and 50 mg of prednisone  on 11/10/23 at 9:40 am. Pt is also to take 50 mg of benadryl  on 11/10/23 at 9:40 am. Please call [phone] with any questions. Allergy/sensitivity addressed by Dr. Ardelle Beavers 11/08/23   Aleta Anda, MD  spironolactone (ALDACTONE) 25 MG tablet Take 25 mg by mouth every morning. 01/18/22   [provider]  traMADol  (ULTRAM ) 50 MG tablet Take 1 tablet (50 mg  total) by mouth every 6 (six) hours as needed. 01/09/24   Glenn Lange, DO      Allergies    Other, Penicillins, Codeine sulfate, Diphenhydramine  hcl, Diphenhydramine  hcl, Iohexol , Oxycodone, Oxycodone-acetaminophen, Percocet [oxycodone-acetaminophen], Shellfish-derived products, Azithromycin, Methocarbamol, Tramadol , and Vancomycin    Review of Systems   Review of Systems  Genitourinary:  Positive for flank pain.    Physical Exam Updated Vital Signs BP (!) 163/72   Pulse 81   Temp (!) 97.5 F (36.4 C) (Oral)   Resp 17   Wt 60.3 kg   SpO2 100%   BMI 24.33 kg/m  Physical Exam Vitals and nursing note reviewed.  Constitutional:      General: She is not in acute distress.    Appearance: She is well-developed. She is not diaphoretic.  HENT:     Head: Normocephalic and atraumatic.  Eyes:     Pupils: Pupils are equal, round, and reactive to light.  Cardiovascular:     Rate and Rhythm: Normal rate and regular rhythm.     Heart sounds: No murmur heard.    No friction rub. No gallop.  Pulmonary:     Effort: Pulmonary effort is normal.     Breath sounds: No wheezing or rales.  Abdominal:     General: There is no distension.     Palpations: Abdomen is soft.     Tenderness: There is no abdominal tenderness.  Musculoskeletal:        General: Tenderness present.     Cervical back: Normal range of motion and neck supple.     Comments: Focal pain to the right low back.  Approximately 5 cm below the CVA.  No obvious CVA tenderness.  No obvious midline spinal tenderness step-offs or deformities.  Skin:    General: Skin is warm and dry.  Neurological:     Mental Status: She is alert and oriented to person, place, and time.  Psychiatric:        Behavior: Behavior normal.     ED Results / Procedures / Treatments   Labs (all labs ordered are listed, but only abnormal results are displayed) Labs Reviewed  URINALYSIS, ROUTINE W REFLEX MICROSCOPIC - Abnormal; Notable for the following  components:      Result Value   Color, Urine AMBER (*)    APPearance CLOUDY (*)    Bilirubin Urine SMALL (*)    Ketones, ur 15 (*)    Protein, ur 30 (*)    All other components within normal limits  CBC WITH DIFFERENTIAL/PLATELET - Abnormal; Notable for the following  components:   WBC 2.9 (*)    Platelets 47 (*)    Lymphs Abs 0.6 (*)    All other components within normal limits  COMPREHENSIVE METABOLIC PANEL WITH GFR - Abnormal; Notable for the following components:   Glucose, Bld 147 (*)    Total Bilirubin 2.2 (*)    All other components within normal limits  URINALYSIS, MICROSCOPIC (REFLEX) - Abnormal; Notable for the following components:   Bacteria, UA MANY (*)    All other components within normal limits  LIPASE, BLOOD  AMMONIA    EKG None  Radiology CT ABDOMEN PELVIS WO CONTRAST Result Date: 01/15/2024 CLINICAL DATA:  Right-sided flank pain for several months EXAM: CT ABDOMEN AND PELVIS WITHOUT CONTRAST TECHNIQUE: Multidetector CT imaging of the abdomen and pelvis was performed following the standard protocol without IV contrast. RADIATION DOSE REDUCTION: This exam was performed according to the departmental dose-optimization program which includes automated exposure control, adjustment of the mA and/or kV according to patient size and/or use of iterative reconstruction technique. COMPARISON:  01/09/2024 FINDINGS: Lower chest: No acute abnormality. Hepatobiliary: Liver is nodular in appearance consistent with underlying cirrhosis. This is stable from the prior exam. There is again noted area vague decreased attenuation best seen in the right lobe on image number 17 of series 301. No specific lesion was noted on recent MRI from 12/16/2023. Gallbladder shows a single lamellated gallstone. Pancreas: Unremarkable. No pancreatic ductal dilatation or surrounding inflammatory changes. Spleen: Mildly prominent. Adrenals/Urinary Tract: Adrenal glands are within normal limits. Kidneys  demonstrate a normal appearance without evidence of hydronephrosis. No calculi are noted. The bladder is decompressed. Stomach/Bowel: The appendix is within normal limits. No obstructive or inflammatory changes of the colon are seen. Stomach and small bowel are unremarkable. Vascular/Lymphatic: Aortic atherosclerosis. No enlarged abdominal or pelvic lymph nodes. 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 Reproductive: Status post hysterectomy. No adnexal masses. Other: No abdominal wall hernia or abnormality. No abdominopelvic ascites. Musculoskeletal: No acute or significant osseous findings. IMPRESSION: Cirrhotic change of the liver. Cholelithiasis without complicating factors. Changes of portal hypertension with splenomegaly and large retroperitoneal varices. Electronically Signed   By: Violeta Grey M.D.   On: 01/15/2024 09:40    Procedures Procedures    Medications Ordered in ED Medications  morphine (PF) 2 MG/ML injection 2 mg (2 mg Intravenous Not Given 01/15/24 0916)  sodium chloride  0.9 % bolus 1,000 mL (0 mLs Intravenous Stopped 01/15/24 1037)  ketorolac  (TORADOL ) 15 MG/ML injection 15 mg (15 mg Intravenous Given 01/15/24 0911)  acetaminophen (TYLENOL) tablet 1,000 mg (1,000 mg Oral Given 01/15/24 0912)  ondansetron  (ZOFRAN ) injection 4 mg (4 mg Intravenous Given 01/15/24 0981)    ED Course/ Medical Decision Making/ A&P                                 Medical Decision Making Amount and/or Complexity of Data Reviewed Labs: ordered. Radiology: ordered.  Risk OTC drugs. Prescription drug management.   89 yo F with a chief complaints of right-sided low back pain.  This is pinpoint and reproduced on exam.  I suspect this is likely musculoskeletal.  She has been seen in the emergency department for this previously.  She had CT imaging at that time with reformats of the L-spine without obvious acute intra-abdominal pathology.  She was started on  a course of tramadol .  Family felt like it worked well to control  her pain but she spent most of the day asleep in bed.  She was quite confused on Friday that the family was wondering if it was due to the tramadol  or if perhaps it was due to hyperammonemia.  Will repeat blood work today.  Repeat CT imaging.  Treat pain.  CT without obvious concerning finding.  Lab work seems to be consistent with recent check.  Ammonia level is normal.  I discussed results with the patient and family.  She is feeling mildly better after symptomatic therapy here.  Encouraged her to follow-up with her family doctor in the office.  11:43 AM:  I have discussed the diagnosis/risks/treatment options with the patient and family.  Evaluation and diagnostic testing in the emergency department does not suggest an emergent condition requiring admission or immediate intervention beyond what has been performed at this time.  They will follow up with PCP. We also discussed returning to the ED immediately if new or worsening sx occur. We discussed the sx which are most concerning (e.g., sudden worsening pain, fever, inability to tolerate by mouth) that necessitate immediate return. Medications administered to the patient during their visit and any new prescriptions provided to the patient are listed below.  Medications given during this visit Medications  morphine (PF) 2 MG/ML injection 2 mg (2 mg Intravenous Not Given 01/15/24 0916)  sodium chloride  0.9 % bolus 1,000 mL (0 mLs Intravenous Stopped 01/15/24 1037)  ketorolac  (TORADOL ) 15 MG/ML injection 15 mg (15 mg Intravenous Given 01/15/24 0911)  acetaminophen (TYLENOL) tablet 1,000 mg (1,000 mg Oral Given 01/15/24 0912)  ondansetron  (ZOFRAN ) injection 4 mg (4 mg Intravenous Given 01/15/24 0910)     The patient appears reasonably screen and/or stabilized for discharge and I doubt any other medical condition or other [facility] requiring further screening, evaluation, or treatment in the ED at  this time prior to discharge.            Final Clinical Impression(s) / ED Diagnoses Final diagnoses:  Acute right-sided low back pain without sciatica    Rx / DC Orders ED Discharge Orders          Ordered    methylPREDNISolone  (MEDROL  DOSEPAK) 4 MG TBPK tablet        01/15/24 1024    diclofenac Sodium (VOLTAREN) 1 % GEL  4 times daily        01/15/24 1024    morphine (MSIR) 15 MG tablet  Every 4 hours PRN        01/15/24 1024              Estevon Fluke, DO 01/15/24 1143

## 2024-01-16 DIAGNOSIS — R109 Unspecified abdominal pain: Secondary | ICD-10-CM | POA: Diagnosis not present

## 2024-01-19 DIAGNOSIS — H43813 Vitreous degeneration, bilateral: Secondary | ICD-10-CM | POA: Diagnosis not present

## 2024-01-19 DIAGNOSIS — H353221 Exudative age-related macular degeneration, left eye, with active choroidal neovascularization: Secondary | ICD-10-CM | POA: Diagnosis not present

## 2024-01-19 DIAGNOSIS — H353112 Nonexudative age-related macular degeneration, right eye, intermediate dry stage: Secondary | ICD-10-CM | POA: Diagnosis not present

## 2024-01-19 DIAGNOSIS — H43391 Other vitreous opacities, right eye: Secondary | ICD-10-CM | POA: Diagnosis not present

## 2024-01-19 DIAGNOSIS — H35033 Hypertensive retinopathy, bilateral: Secondary | ICD-10-CM | POA: Diagnosis not present

## 2024-01-20 DIAGNOSIS — E1140 Type 2 diabetes mellitus with diabetic neuropathy, unspecified: Secondary | ICD-10-CM | POA: Diagnosis not present

## 2024-01-20 DIAGNOSIS — K7469 Other cirrhosis of liver: Secondary | ICD-10-CM | POA: Diagnosis not present

## 2024-01-20 DIAGNOSIS — Z0000 Encounter for general adult medical examination without abnormal findings: Secondary | ICD-10-CM | POA: Diagnosis not present

## 2024-01-20 DIAGNOSIS — Z23 Encounter for immunization: Secondary | ICD-10-CM | POA: Diagnosis not present

## 2024-01-20 DIAGNOSIS — E1165 Type 2 diabetes mellitus with hyperglycemia: Secondary | ICD-10-CM | POA: Diagnosis not present

## 2024-01-20 DIAGNOSIS — R1011 Right upper quadrant pain: Secondary | ICD-10-CM | POA: Diagnosis not present

## 2024-02-08 NOTE — Therapy (Signed)
 OUTPATIENT PHYSICAL THERAPY THORACOLUMBAR EVALUATION   Patient Name: Sheila Hanson MRN: [id number] DOB:July 03, 1934, 89 y.o., female Today's Date: 02/09/2024  END OF SESSION:  PT End of Session - 02/09/24 1018     Visit Number 1    Date for PT Re-Evaluation 05/11/24    Authorization Type Medicare    PT Start Time 1015    PT Stop Time 1100    PT Time Calculation (min) 45 min    Activity Tolerance Patient tolerated treatment well    Behavior During Therapy [facility] for tasks assessed/performed          Past Medical History:  Diagnosis Date   Cirrhosis (HCC)    Colon adenoma 08/16/2005   Coronary artery disease    Diabetes mellitus without complication (HCC)    GERD (gastroesophageal reflux disease)    Humeral head fracture    Hypercholesteremia    Hypertension    Osteopenia    Positive PPD    Varicose veins    Wrist fracture    History reviewed. No pertinent surgical history. Patient Active Problem List   Diagnosis Date Noted   Pain due to onychomycosis of toenails of both feet 01/06/2022   Type 2 diabetes mellitus with vascular disease (HCC) 01/06/2022    PCP: Charlott, MD  REFERRING PROVIDER: [place], PA  REFERRING DIAG: right back and flank pain  Rationale for Evaluation and Treatment: Rehabilitation  THERAPY DIAG:  Other low back pain  Muscle spasm of back  Muscle weakness (generalized)  ONSET DATE: 01/16/24  SUBJECTIVE:                                                                                                                                                                                           SUBJECTIVE STATEMENT: Patient is a 89 y.o. female who was seen today for physical therapy evaluation and treatment for low back and right flank pain.  She reports that she did care for her husband who passed away in 07-28-24.  She reports that she wore her self out with keeping him at home.  She does have some liver issues.  She does recall lifting about  45# and really caused a pulling  PERTINENT HISTORY:  See above  PAIN:  Are you having pain? Yes: NPRS scale: 4/10 Pain location: back  Pain description: sore, spasm Aggravating factors: standing, bending, shopping pain will go up to 9-10/10 Relieving factors: warm shower at best pain a 3-4/10  PRECAUTIONS: None  RED FLAGS: None   WEIGHT BEARING RESTRICTIONS: No  FALLS:  Has patient fallen in last 6 months? No  LIVING ENVIRONMENT: Lives with: lives alone  Lives in: House/apartment Stairs: has a ramp into the home, no stairs in the home Has following equipment at home: Single point cane and Shower bench  OCCUPATION: retired  PLOF: Independent walks daily with a SPC 15 minutes, was shopping and cleaning house, did some plant care  PATIENT GOALS: have less pain  NEXT MD VISIT: none  OBJECTIVE:  Note: Objective measures were completed at Evaluation unless otherwise noted.  DIAGNOSTIC FINDINGS:  No kidney stones  PATIENT SURVEYS:  ODI: 60%  COGNITION: Overall cognitive status: Within functional limits for tasks assessed     SENSATION: WFL  MUSCLE LENGTH: Mild tightness in the HS, calves and piriformis POSTURE: rounded shoulders, forward head, and decreased lumbar lordosis  PALPATION: She is very tight and tender in the low back and into the flank bilaterally  LUMBAR ROM:   AROM eval  Flexion Decreased 50%  Extension 50%  Right lateral flexion 50%  Left lateral flexion 50%  Right rotation 50%  Left rotation    (Blank rows = not tested)  LOWER EXTREMITY MMT   all increase pain  Active  Right eval Left eval  Hip flexion 4- 4-  Hip extension    Hip abduction    Hip adduction    Hip internal rotation    Hip external rotation    Knee flexion 4 4  Knee extension 4- 4-  Ankle dorsiflexion    Ankle plantarflexion    Ankle inversion    Ankle eversion     (Blank rows = not tested)  LOWER EXTREMITY ROM:  WNL    LUMBAR SPECIAL TESTS:  Straight  leg raise test: Positive and Slump test: Positive  FUNCTIONAL TESTS:  5 times sit to stand: 25 seconds Timed up and go (TUG): 22 seconds with a SPC 3 minute walk test: not tested  GAIT: Distance walked: 50 feet Assistive device utilized: Single point cane Level of assistance: Modified independence Comments: slow antalgic on the right, mild unsteadiness with turning  TREATMENT DATE:  02/09/24 Evaluation                                                                                                                                 PATIENT EDUCATION:  Education details: HEP Person educated: Patient Education method: Programmer, multimedia, Facilities manager, Verbal cues, and Handouts Education comprehension: verbalized understanding  HOME EXERCISE PROGRAM: Access Code: [email or web address] URL: [email or web address] Date: 02/09/2024 Prepared by: Ozell Mainland  Exercises - Seated Shoulder Shrugs  - 1 x daily - 7 x weekly - 2 sets - 10 reps - 3 hold - Seated Scapular Retraction  - 1 x daily - 7 x weekly - 2 sets - 10 reps - 3 hold - Supine Posterior Pelvic Tilt  - 1 x daily - 7 x weekly - 2 sets - 10 reps - 3 hold - Hooklying Single Knee to Chest Stretch  - 1 x daily - 7 x weekly - 2 sets - 10  reps - 5 hold - Supine Lower Trunk Rotation  - 1 x daily - 7 x weekly - 2 sets - 10 reps - 5 hold  ASSESSMENT:  CLINICAL IMPRESSION: Patient is a 89 y.o. female who was seen today for physical therapy evaluation and treatment for low back and right flank pain.  She reports that she did care for her husband who passed away in 07-22-24.  She reports that she wore her self out with keeping him at home.  She does have some liver issues .   OBJECTIVE IMPAIRMENTS: Abnormal gait, cardiopulmonary status limiting activity, decreased activity tolerance, decreased balance, decreased endurance, decreased mobility, difficulty walking, decreased ROM, decreased strength, increased fascial restrictions, increased  muscle spasms, impaired flexibility, improper body mechanics, postural dysfunction, and pain.   ACTIVITY LIMITATIONS: carrying, lifting, bending, sitting, standing, sleeping, stairs, and locomotion level  PARTICIPATION LIMITATIONS: cleaning, shopping, community activity, and yard work  PERSONAL FACTORS: Age, Fitness, and Time since onset of injury/illness/exacerbation are also affecting patient's functional outcome.   REHAB POTENTIAL: Good  CLINICAL DECISION MAKING: Evolving/moderate complexity  EVALUATION COMPLEXITY: Moderate   GOALS: Goals reviewed with patient? Yes  SHORT TERM GOALS: Target date: 03/04/24  Independent with initial HEP Baseline: Goal status: INITIAL   LONG TERM GOALS: Target date: 05/11/24  Independent with advanced HEP Baseline:  Goal status: INITIAL  2.  Understand proper posture and body mechanics Baseline:  Goal status: INITIAL  3.  Decrease pain 50% Baseline:  Goal status: INITIAL  4.  Increase lumbar ROM 25% Baseline:  Goal status: INITIAL  5.  Report able to clean her house Baseline: currently is having someone clean Goal status: INITIAL  6.  Able to shop for groceries Baseline: family members are currently doing this Goal status: INITIAL  PLAN:  PT FREQUENCY: 2x/week  PT DURATION: 12 weeks  PLANNED INTERVENTIONS: [id number]- PT Re-evaluation, 97110-Therapeutic exercises, 97530- Therapeutic activity, [id number]- Neuromuscular re-education, 97535- Self Care, [id number]- Manual therapy, [id number]- Gait training, [phone]- Electrical stimulation (unattended), [id number]- Ultrasound, [id number]- Traction (mechanical), [id number]- Ionotophoresis 4mg /ml Dexamethasone, Patient/Family education, Balance training, Stair training, Taping, Joint mobilization, Cryotherapy, and Moist heat.  PLAN FOR NEXT SESSION: start some exercises, possible STM   OBADIAH OZELL ORN, PT 02/09/2024, 10:18 AM

## 2024-02-09 ENCOUNTER — Encounter: Payer: Self-pay | Admitting: Physical Therapy

## 2024-02-09 ENCOUNTER — Ambulatory Visit: Attending: Physician Assistant | Admitting: Physical Therapy

## 2024-02-09 DIAGNOSIS — M5459 Other low back pain: Secondary | ICD-10-CM | POA: Diagnosis not present

## 2024-02-09 DIAGNOSIS — M62830 Muscle spasm of back: Secondary | ICD-10-CM | POA: Insufficient documentation

## 2024-02-09 DIAGNOSIS — M6281 Muscle weakness (generalized): Secondary | ICD-10-CM | POA: Diagnosis not present

## 2024-02-09 NOTE — Addendum Note (Signed)
 Addended by: OBADIAH OZELL ORN on: 02/09/2024 11:59 AM   Modules accepted: Orders

## 2024-02-21 ENCOUNTER — Ambulatory Visit: Attending: Physician Assistant | Admitting: Physical Therapy

## 2024-02-21 ENCOUNTER — Encounter: Payer: Self-pay | Admitting: Physical Therapy

## 2024-02-21 DIAGNOSIS — M6281 Muscle weakness (generalized): Secondary | ICD-10-CM | POA: Diagnosis not present

## 2024-02-21 DIAGNOSIS — M5459 Other low back pain: Secondary | ICD-10-CM | POA: Insufficient documentation

## 2024-02-21 DIAGNOSIS — R2689 Other abnormalities of gait and mobility: Secondary | ICD-10-CM | POA: Diagnosis not present

## 2024-02-21 DIAGNOSIS — M62830 Muscle spasm of back: Secondary | ICD-10-CM | POA: Insufficient documentation

## 2024-02-21 NOTE — Therapy (Signed)
 OUTPATIENT PHYSICAL THERAPY THORACOLUMBAR EVALUATION   Patient Name: Sheila Hanson MRN: [id number] DOB:1934/04/12, 89 y.o., female Today's Date: 02/21/2024  END OF SESSION:  PT End of Session - 02/21/24 1015     Visit Number 3    Date for PT Re-Evaluation 05/11/24    PT Start Time 1015    PT Stop Time 1100    PT Time Calculation (min) 45 min    Activity Tolerance Patient tolerated treatment well    Behavior During Therapy [facility] for tasks assessed/performed          Past Medical History:  Diagnosis Date   Cirrhosis (HCC)    Colon adenoma 08/16/2005   Coronary artery disease    Diabetes mellitus without complication (HCC)    GERD (gastroesophageal reflux disease)    Humeral head fracture    Hypercholesteremia    Hypertension    Osteopenia    Positive PPD    Varicose veins    Wrist fracture    History reviewed. No pertinent surgical history. Patient Active Problem List   Diagnosis Date Noted   Pain due to onychomycosis of toenails of both feet 01/06/2022   Type 2 diabetes mellitus with vascular disease (HCC) 01/06/2022    PCP: Charlott, MD  REFERRING PROVIDER: Cle[place]and, PA  REFERRING DIAG: right back and flank pain  Rationale for Evaluation and Treatment: Rehabilitation  THERAPY DIAG:  Other low back pain  Muscle spasm of back  Muscle weakness (generalized)  ONSET DATE: 01/16/24  SUBJECTIVE:                                                                                                                                                                                           SUBJECTIVE STATEMENT:   I reckon I'm ok 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 female who was seen today for physical therapy evaluation and treatment for low back and right flank pain.  She reports that she did care for her husband who passed away in 2024/07/01.  She reports that she wore her self out with  keeping him at home.  She does have some liver issues.  She does recall lifting about 45# and really caused a pulling  PERTINENT HISTORY:  See above  PAIN:  Are you having pain? Yes: NPRS scale: 3/10 Pain location: back  Pain description: sore, spasm Aggravating factors: standing, bending, shopping pain will go up to 9-10/10 Relieving factors: warm shower at best pain a 3-4/10  PRECAUTIONS: None  RED FLAGS: None   WEIGHT  BEARING RESTRICTIONS: No  FALLS:  Has patient fallen in last 6 months? No  LIVING ENVIRONMENT: Lives with: lives alone Lives in: House/apartment Stairs: has a ramp into the home, no stairs in the home Has following equipment at home: Single point cane and Shower bench  OCCUPATION: retired  PLOF: Independent walks daily with a SPC 15 minutes, was shopping and cleaning house, did some plant care  PATIENT GOALS: have less pain  NEXT MD VISIT: none  OBJECTIVE:  Note: Objective measures were completed at Evaluation unless otherwise noted.  DIAGNOSTIC FINDINGS:  No kidney stones  PATIENT SURVEYS:  ODI: 60%  COGNITION: Overall cognitive status: Within functional limits for tasks assessed     SENSATION: WFL  MUSCLE LENGTH: Mild tightness in the HS, calves and piriformis POSTURE: rounded shoulders, forward head, and decreased lumbar lordosis  PALPATION: She is very tight and tender in the low back and into the flank bilaterally  LUMBAR ROM:   AROM eval  Flexion Decreased 50%  Extension 50%  Right lateral flexion 50%  Left lateral flexion 50%  Right rotation 50%  Left rotation    (Blank rows = not tested)  LOWER EXTREMITY MMT   all increase pain  Active  Right eval Left eval  Hip flexion 4- 4-  Hip extension    Hip abduction    Hip adduction    Hip internal rotation    Hip external rotation    Knee flexion 4 4  Knee extension 4- 4-  Ankle dorsiflexion    Ankle plantarflexion    Ankle inversion    Ankle eversion     (Blank  rows = not tested)  LOWER EXTREMITY ROM:  WNL    LUMBAR SPECIAL TESTS:  Straight leg raise test: Positive and Slump test: Positive  FUNCTIONAL TESTS:  5 times sit to stand: 25 seconds Timed up and go (TUG): 22 seconds with a SPC 3 minute walk test: not tested  GAIT: Distance walked: 50 feet Assistive device utilized: Single point cane Level of assistance: Modified independence Comments: slow antalgic on the right, mild unsteadiness with turning  TREATMENT DATE:  02/21/24 NuStep L4 x 6 min Sit to stand holding red ball  Rows & Ext green 2x10  STM to R flank area Passive stretching to HS,Piriformis, ITB, and [facility]   02/09/24 Evaluation                                                                                                                                 PATIENT EDUCATION:  Education details: HEP Person educated: Patient Education method: Programmer, multimedia, Facilities manager, Verbal cues, and Handouts Education comprehension: verbalized understanding  HOME EXERCISE PROGRAM: Access Code: [email or web address] URL: [email or web address] Date: 02/09/2024 Prepared by: Ozell Mainland  Exercises - Seated Shoulder Shrugs  - 1 x daily - 7 x weekly - 2 sets - 10 reps - 3 hold - Seated Scapular Retraction  - 1 x daily - 7 x  weekly - 2 sets - 10 reps - 3 hold - Supine Posterior Pelvic Tilt  - 1 x daily - 7 x weekly - 2 sets - 10 reps - 3 hold - Hooklying Single Knee to Chest Stretch  - 1 x daily - 7 x weekly - 2 sets - 10 reps - 5 hold - Supine Lower Trunk Rotation  - 1 x daily - 7 x weekly - 2 sets - 10 reps - 5 hold  ASSESSMENT:  CLINICAL IMPRESSION: Patient is a 89 y.o. female who was seen today for physical therapy  treatment for low back and right flank pain.  She reports that she did care for her husband who passed away in 07/17/24 She does have some liver issues. Tenderness int he R flank over the last rib with STM. Postural cues needed with rows and extensions. RLE  tightness noted with passive stretching.Pt reported hs could feel the HS stretch in her R flank.  OBJECTIVE IMPAIRMENTS: Abnormal gait, cardiopulmonary status limiting activity, decreased activity tolerance, decreased balance, decreased endurance, decreased mobility, difficulty walking, decreased ROM, decreased strength, increased fascial restrictions, increased muscle spasms, impaired flexibility, improper body mechanics, postural dysfunction, and pain.   ACTIVITY LIMITATIONS: carrying, lifting, bending, sitting, standing, sleeping, stairs, and locomotion level  PARTICIPATION LIMITATIONS: cleaning, shopping, community activity, and yard work  PERSONAL FACTORS: Age, Fitness, and Time since onset of injury/illness/exacerbation are also affecting patient's functional outcome.   REHAB POTENTIAL: Good  CLINICAL DECISION MAKING: Evolving/moderate complexity  EVALUATION COMPLEXITY: Moderate   GOALS: Goals reviewed with patient? Yes  SHORT TERM GOALS: Target date: 03/04/24  Independent with initial HEP Baseline: Goal status: INITIAL   LONG TERM GOALS: Target date: 05/11/24  Independent with advanced HEP Baseline:  Goal status: INITIAL  2.  Understand proper posture and body mechanics Baseline:  Goal status: INITIAL  3.  Decrease pain 50% Baseline:  Goal status: INITIAL  4.  Increase lumbar ROM 25% Baseline:  Goal status: INITIAL  5.  Report able to clean her house Baseline: currently is having someone clean Goal status: INITIAL  6.  Able to shop for groceries Baseline: family members are currently doing this Goal status: INITIAL  PLAN:  PT FREQUENCY: 2x/week  PT DURATION: 12 weeks  PLANNED INTERVENTIONS: 97164- PT Re-evaluation, 97110-Therapeutic exercises, 97530- Therapeutic activity, [id number]- Neuromuscular re-education, 97535- Self Care, [id number]- Manual therapy, [id number]- Gait training, [phone]- Electrical stimulation (unattended), [id number]- Ultrasound, [id number]- Traction  (mechanical), [id number]- Ionotophoresis 4mg /ml Dexamethasone, Patient/Family education, Balance training, Stair training, Taping, Joint mobilization, Cryotherapy, and Moist heat.  PLAN FOR NEXT SESSION: start some exercises, possible STM   Tanda KANDICE Sorrow, PTA 02/21/2024, 10:16 AM

## 2024-02-22 ENCOUNTER — Ambulatory Visit (INDEPENDENT_AMBULATORY_CARE_PROVIDER_SITE_OTHER): Admitting: Podiatry

## 2024-02-22 ENCOUNTER — Encounter: Payer: Self-pay | Admitting: Podiatry

## 2024-02-22 ENCOUNTER — Ambulatory Visit

## 2024-02-22 DIAGNOSIS — B351 Tinea unguium: Secondary | ICD-10-CM

## 2024-02-22 DIAGNOSIS — E1159 Type 2 diabetes mellitus with other circulatory complications: Secondary | ICD-10-CM

## 2024-02-22 DIAGNOSIS — M79675 Pain in left toe(s): Secondary | ICD-10-CM | POA: Diagnosis not present

## 2024-02-22 DIAGNOSIS — M79674 Pain in right toe(s): Secondary | ICD-10-CM

## 2024-02-22 NOTE — Progress Notes (Signed)
 This patient returns to my office for at risk foot care.  This patient requires this care by a professional since this patient will be at risk due to having  diabetes.  This patient is unable to cut nails herself since the patient cannot reach her nails.These nails are painful walking and wearing shoes.  This patient presents for at risk foot care today.  General Appearance  Alert, conversant and in no acute stress.  Vascular  Dorsalis pedis and posterior tibial  pulses are  weakly palpable B/L Capillary return is within normal limits  bilaterally. Temperature is within normal limits  bilaterally.  Neurologic  Senn-Weinstein monofilament wire test within normal limits  bilaterally. Muscle power within normal limits bilaterally.  Nails Thick disfigured discolored nails with subungual debris  from hallux to fifth toes bilaterally. No evidence of bacterial infection or drainage bilaterally.  Orthopedic  No limitations of motion  feet .  No crepitus or effusions noted.  No bony pathology or digital deformities noted.  Skin  normotropic skin with no porokeratosis noted bilaterally.  No signs of infections or ulcers noted.     Onychomycosis  Pain in right toes  Pain in left toes  Consent was obtained for treatment procedures.   Mechanical debridement of nails 1-5  bilaterally performed with a nail nipper.  Filed with dremel without incident.    Return office visit   3 months                   Told patient to return for periodic foot care and evaluation due to potential at risk complications.   Helane Gunther DPM

## 2024-02-23 DIAGNOSIS — H903 Sensorineural hearing loss, bilateral: Secondary | ICD-10-CM | POA: Diagnosis not present

## 2024-02-24 DIAGNOSIS — K7460 Unspecified cirrhosis of liver: Secondary | ICD-10-CM | POA: Diagnosis not present

## 2024-02-27 ENCOUNTER — Ambulatory Visit: Admitting: Physical Therapy

## 2024-02-27 ENCOUNTER — Encounter: Payer: Self-pay | Admitting: Physical Therapy

## 2024-02-27 DIAGNOSIS — M6281 Muscle weakness (generalized): Secondary | ICD-10-CM

## 2024-02-27 DIAGNOSIS — M62830 Muscle spasm of back: Secondary | ICD-10-CM

## 2024-02-27 DIAGNOSIS — R2689 Other abnormalities of gait and mobility: Secondary | ICD-10-CM | POA: Diagnosis not present

## 2024-02-27 DIAGNOSIS — M5459 Other low back pain: Secondary | ICD-10-CM

## 2024-02-27 NOTE — Therapy (Signed)
 OUTPATIENT PHYSICAL THERAPY THORACOLUMBAR EVALUATION   Patient Name: Sheila Hanson MRN: [id number] DOB:[birth year], 89 y.o., female Today's Date: 02/27/2024  END OF SESSION:  PT End of Session - 02/27/24 1354     Visit Number 4    Date for PT Re-Evaluation 05/11/24    PT Start Time 1354    PT Stop Time 1430    PT Time Calculation (min) 36 min    Activity Tolerance Patient tolerated treatment well    Behavior During Therapy [facility] for tasks assessed/performed          Past Medical History:  Diagnosis Date   Cirrhosis (HCC)    Colon adenoma 08/16/2005   Coronary artery disease    Diabetes mellitus without complication (HCC)    GERD (gastroesophageal reflux disease)    Humeral head fracture    Hypercholesteremia    Hypertension    Osteopenia    Positive PPD    Varicose veins    Wrist fracture    History reviewed. No pertinent surgical history. Patient Active Problem List   Diagnosis Date Noted   Pain due to onychomycosis of toenails of both feet 01/06/2022   Type 2 diabetes mellitus with vascular disease (HCC) 01/06/2022    PCP: Charlott, MD  REFERRING PROVIDER: [place], PA  REFERRING DIAG: right back and flank pain  Rationale for Evaluation and Treatment: Rehabilitation  THERAPY DIAG:  Other low back pain  Muscle spasm of back  Muscle weakness (generalized)  Other abnormalities of gait and mobility  ONSET DATE: 01/16/24  SUBJECTIVE:                                                                                                                                                                                           SUBJECTIVE STATEMENT:  Still sore and painful Can't touch it its like the skin is sore  Patient is a 89 y.o. female who was seen today for physical therapy evaluation and treatment for low back and right flank pain.  She reports that she did care for her husband who passed away in 2024/07/27.  She reports that she wore her self out with  keeping him at home.  She does have some liver issues.  She does recall lifting about 45# and really caused a pulling  PERTINENT HISTORY:  See above  PAIN:  Are you having pain? Yes: NPRS scale: 1/10 Pain location: back  Pain description: sore, spasm Aggravating factors: standing, bending, shopping pain will go up to 9-10/10 Relieving factors: warm shower at best pain a 3-4/10  PRECAUTIONS: None  RED FLAGS: None   WEIGHT BEARING RESTRICTIONS: No  FALLS:  Has patient fallen in last 6 months? No  LIVING ENVIRONMENT: Lives with: lives alone Lives in: House/apartment Stairs: has a ramp into the home, no stairs in the home Has following equipment at home: Single point cane and Shower bench  OCCUPATION: retired  PLOF: Independent walks daily with a SPC 15 minutes, was shopping and cleaning house, did some plant care  PATIENT GOALS: have less pain  NEXT MD VISIT: none  OBJECTIVE:  Note: Objective measures were completed at Evaluation unless otherwise noted.  DIAGNOSTIC FINDINGS:  No kidney stones  PATIENT SURVEYS:  ODI: 60%  COGNITION: Overall cognitive status: Within functional limits for tasks assessed     SENSATION: WFL  MUSCLE LENGTH: Mild tightness in the HS, calves and piriformis POSTURE: rounded shoulders, forward head, and decreased lumbar lordosis  PALPATION: She is very tight and tender in the low back and into the flank bilaterally  LUMBAR ROM:   AROM eval  Flexion Decreased 50%  Extension 50%  Right lateral flexion 50%  Left lateral flexion 50%  Right rotation 50%  Left rotation    (Blank rows = not tested)  LOWER EXTREMITY MMT   all increase pain  Active  Right eval Left eval  Hip flexion 4- 4-  Hip extension    Hip abduction    Hip adduction    Hip internal rotation    Hip external rotation    Knee flexion 4 4  Knee extension 4- 4-  Ankle dorsiflexion    Ankle plantarflexion    Ankle inversion    Ankle eversion     (Blank  rows = not tested)  LOWER EXTREMITY ROM:  WNL    LUMBAR SPECIAL TESTS:  Straight leg raise test: Positive and Slump test: Positive  FUNCTIONAL TESTS:  5 times sit to stand: 25 seconds Timed up and go (TUG): 22 seconds with a SPC 3 minute walk test: not tested  GAIT: Distance walked: 50 feet Assistive device utilized: Single point cane Level of assistance: Modified independence Comments: slow antalgic on the right, mild unsteadiness with turning  TREATMENT DATE:  02/27/24 NuStep L4 x 6 min S2S w/ OHP yellow ball 2x10 Rows & Lats 15lb 2x10 Passive stretching to HS,Piriformis, ITB, and K2C  02/21/24 NuStep L4 x 6 min Sit to stand holding red ball  Rows & Ext green 2x10  STM to R flank area Passive stretching to HS,Piriformis, ITB, and [facility]   02/09/24 Evaluation                                                                                                                                 PATIENT EDUCATION:  Education details: HEP Person educated: Patient Education method: Programmer, multimedia, Facilities manager, Verbal cues, and Handouts Education comprehension: verbalized understanding  HOME EXERCISE PROGRAM: Access Code: [email or web address] URL: [email or web address] Date: 02/09/2024 Prepared by: Ozell Mainland  Exercises - Seated Shoulder Shrugs  - 1 x daily - 7 x weekly - 2  sets - 10 reps - 3 hold - Seated Scapular Retraction  - 1 x daily - 7 x weekly - 2 sets - 10 reps - 3 hold - Supine Posterior Pelvic Tilt  - 1 x daily - 7 x weekly - 2 sets - 10 reps - 3 hold - Hooklying Single Knee to Chest Stretch  - 1 x daily - 7 x weekly - 2 sets - 10 reps - 5 hold - Supine Lower Trunk Rotation  - 1 x daily - 7 x weekly - 2 sets - 10 reps - 5 hold  ASSESSMENT:  CLINICAL IMPRESSION: Patient is a 89 y.o. female who was seen today for physical therapy  treatment for low back and right flank pain.  She reports that she did care for her husband who passed away in 2024-07-13, She does  have some liver issues. Tenderness in the R flank remains with light touch. Postural cues needed with rows to prevent posterior trunk lean. All interventions completed well.  RLE tightness noted with passive stretching.   OBJECTIVE IMPAIRMENTS: Abnormal gait, cardiopulmonary status limiting activity, decreased activity tolerance, decreased balance, decreased endurance, decreased mobility, difficulty walking, decreased ROM, decreased strength, increased fascial restrictions, increased muscle spasms, impaired flexibility, improper body mechanics, postural dysfunction, and pain.   ACTIVITY LIMITATIONS: carrying, lifting, bending, sitting, standing, sleeping, stairs, and locomotion level  PARTICIPATION LIMITATIONS: cleaning, shopping, community activity, and yard work  PERSONAL FACTORS: Age, Fitness, and Time since onset of injury/illness/exacerbation are also affecting patient's functional outcome.   REHAB POTENTIAL: Good  CLINICAL DECISION MAKING: Evolving/moderate complexity  EVALUATION COMPLEXITY: Moderate   GOALS: Goals reviewed with patient? Yes  SHORT TERM GOALS: Target date: 03/04/24  Independent with initial HEP Baseline: Goal status: INITIAL   LONG TERM GOALS: Target date: 05/11/24  Independent with advanced HEP Baseline:  Goal status: INITIAL  2.  Understand proper posture and body mechanics Baseline:  Goal status: INITIAL  3.  Decrease pain 50% Baseline:  Goal status: INITIAL  4.  Increase lumbar ROM 25% Baseline:  Goal status: INITIAL  5.  Report able to clean her house Baseline: currently is having someone clean Goal status: INITIAL  6.  Able to shop for groceries Baseline: family members are currently doing this Goal status: INITIAL  PLAN:  PT FREQUENCY: 2x/week  PT DURATION: 12 weeks  PLANNED INTERVENTIONS: 97164- PT Re-evaluation, 97110-Therapeutic exercises, 97530- Therapeutic activity, [id number]- Neuromuscular re-education, 97535- Self Care, [id number]-  Manual therapy, [id number]- Gait training, [phone]- Electrical stimulation (unattended), [id number]- Ultrasound, [id number]- Traction (mechanical), [id number]- Ionotophoresis 4mg /ml Dexamethasone, Patient/Family education, Balance training, Stair training, Taping, Joint mobilization, Cryotherapy, and Moist heat.  PLAN FOR NEXT SESSION: start some exercises, possible STM   Tanda KANDICE Sorrow, PTA 02/27/2024, 1:55 PM

## 2024-02-29 ENCOUNTER — Ambulatory Visit: Admitting: Physical Therapy

## 2024-02-29 ENCOUNTER — Encounter: Payer: Self-pay | Admitting: Physical Therapy

## 2024-02-29 DIAGNOSIS — M5459 Other low back pain: Secondary | ICD-10-CM | POA: Diagnosis not present

## 2024-02-29 DIAGNOSIS — M62830 Muscle spasm of back: Secondary | ICD-10-CM | POA: Diagnosis not present

## 2024-02-29 DIAGNOSIS — M6281 Muscle weakness (generalized): Secondary | ICD-10-CM

## 2024-02-29 DIAGNOSIS — R2689 Other abnormalities of gait and mobility: Secondary | ICD-10-CM

## 2024-02-29 NOTE — Therapy (Signed)
 OUTPATIENT PHYSICAL THERAPY THORACOLUMBAR EVALUATION   Patient Name: Sheila Hanson MRN: [id number] DOB:1933-09-29, 89 y.o., female Today's Date: 02/29/2024  END OF SESSION:  PT End of Session - 02/29/24 1317     Visit Number 5    Date for PT Re-Evaluation 05/11/24    Authorization Type Medicare    PT Start Time 1315    PT Stop Time 1400    PT Time Calculation (min) 45 min    Activity Tolerance Patient tolerated treatment well    Behavior During Therapy [facility] for tasks assessed/performed          Past Medical History:  Diagnosis Date   Cirrhosis (HCC)    Colon adenoma 08/16/2005   Coronary artery disease    Diabetes mellitus without complication (HCC)    GERD (gastroesophageal reflux disease)    Humeral head fracture    Hypercholesteremia    Hypertension    Osteopenia    Positive PPD    Varicose veins    Wrist fracture    History reviewed. No pertinent surgical history. Patient Active Problem List   Diagnosis Date Noted   Pain due to onychomycosis of toenails of both feet 01/06/2022   Type 2 diabetes mellitus with vascular disease (HCC) 01/06/2022    PCP: Charlott, MD  REFERRING PROVIDER: [place], PA  REFERRING DIAG: right back and flank pain  Rationale for Evaluation and Treatment: Rehabilitation  THERAPY DIAG:  Other low back pain  Muscle spasm of back  Muscle weakness (generalized)  Other abnormalities of gait and mobility  ONSET DATE: 01/16/24  SUBJECTIVE:                                                                                                                                                                                           SUBJECTIVE STATEMENT:  Still very sore and tender, took two tylenol  last night, I do think I am getting a little better, just touching it hurts and lying on side  Patient is a 89 y.o. female who was seen today for physical therapy evaluation and treatment for low back and right flank pain.  She reports that  she did care for her husband who passed away in 07/14/2024.  She reports that she wore her self out with keeping him at home.  She does have some liver issues.  She does recall lifting about 45# and really caused a pulling  PERTINENT HISTORY:  See above  PAIN:  Are you having pain? Yes: NPRS scale: 1/10 Pain location: back  Pain description: sore, spasm Aggravating factors: standing, bending, shopping pain will go up to 9-10/10 Relieving factors: warm shower  at best pain a 3-4/10  PRECAUTIONS: None  RED FLAGS: None   WEIGHT BEARING RESTRICTIONS: No  FALLS:  Has patient fallen in last 6 months? No  LIVING ENVIRONMENT: Lives with: lives alone Lives in: House/apartment Stairs: has a ramp into the home, no stairs in the home Has following equipment at home: Single point cane and Shower bench  OCCUPATION: retired  PLOF: Independent walks daily with a SPC 15 minutes, was shopping and cleaning house, did some plant care  PATIENT GOALS: have less pain  NEXT MD VISIT: none  OBJECTIVE:  Note: Objective measures were completed at Evaluation unless otherwise noted.  DIAGNOSTIC FINDINGS:  No kidney stones  PATIENT SURVEYS:  ODI: 60%  COGNITION: Overall cognitive status: Within functional limits for tasks assessed     SENSATION: WFL  MUSCLE LENGTH: Mild tightness in the HS, calves and piriformis POSTURE: rounded shoulders, forward head, and decreased lumbar lordosis  PALPATION: She is very tight and tender in the low back and into the flank bilaterally  LUMBAR ROM:   AROM eval  Flexion Decreased 50%  Extension 50%  Right lateral flexion 50%  Left lateral flexion 50%  Right rotation 50%  Left rotation    (Blank rows = not tested)  LOWER EXTREMITY MMT   all increase pain  Active  Right eval Left eval  Hip flexion 4- 4-  Hip extension    Hip abduction    Hip adduction    Hip internal rotation    Hip external rotation    Knee flexion 4 4  Knee extension 4-  4-  Ankle dorsiflexion    Ankle plantarflexion    Ankle inversion    Ankle eversion     (Blank rows = not tested)  LOWER EXTREMITY ROM:  WNL    LUMBAR SPECIAL TESTS:  Straight leg raise test: Positive and Slump test: Positive  FUNCTIONAL TESTS:  5 times sit to stand: 25 seconds Timed up and go (TUG): 22 seconds with a SPC 3 minute walk test: not tested  GAIT: Distance walked: 50 feet Assistive device utilized: Single point cane Level of assistance: Modified independence Comments: slow antalgic on the right, mild unsteadiness with turning  TREATMENT DATE:  02/29/24 Nustep level 5 x 6 minutes Feet on ball K2C, rotation, bridge, isometric abs Red tband row Red tband extension Bridge with clamshell red tband  MHP/estim to the right flank area to see if this would help with the pain and tenderness   02/27/24 NuStep L4 x 6 min S2S w/ OHP yellow ball 2x10 Rows & Lats 15lb 2x10 Passive stretching to HS,Piriformis, ITB, and K2C  02/21/24 NuStep L4 x 6 min Sit to stand holding red ball  Rows & Ext green 2x10  STM to R flank area Passive stretching to HS,Piriformis, ITB, and [facility]   02/09/24 Evaluation  PATIENT EDUCATION:  Education details: HEP Person educated: Patient Education method: Programmer, multimedia, Facilities manager, Verbal cues, and Handouts Education comprehension: verbalized understanding  HOME EXERCISE PROGRAM: Access Code: [email or web address] URL: [email or web address] Date: 02/09/2024 Prepared by: Ozell Mainland  Exercises - Seated Shoulder Shrugs  - 1 x daily - 7 x weekly - 2 sets - 10 reps - 3 hold - Seated Scapular Retraction  - 1 x daily - 7 x weekly - 2 sets - 10 reps - 3 hold - Supine Posterior Pelvic Tilt  - 1 x daily - 7 x weekly - 2 sets - 10 reps - 3 hold - Hooklying Single Knee to Chest Stretch  - 1 x daily - 7 x weekly -  2 sets - 10 reps - 5 hold - Supine Lower Trunk Rotation  - 1 x daily - 7 x weekly - 2 sets - 10 reps - 5 hold  ASSESSMENT:  CLINICAL IMPRESSION: Patient is a 89 y.o. female who was seen today for physical therapy  treatment for low back and right flank pain.  She reports that she did care for her husband who passed away in Jul 24, 2024, She does have some liver issues. Tenderness in the R flank remains with light touch.   I continued some strength and flexibility, she is just very tender to the right flank area to touch and pressure, I went ahead and did a trial of MHP and estim to see if this would desensitize the area  OBJECTIVE IMPAIRMENTS: Abnormal gait, cardiopulmonary status limiting activity, decreased activity tolerance, decreased balance, decreased endurance, decreased mobility, difficulty walking, decreased ROM, decreased strength, increased fascial restrictions, increased muscle spasms, impaired flexibility, improper body mechanics, postural dysfunction, and pain.   ACTIVITY LIMITATIONS: carrying, lifting, bending, sitting, standing, sleeping, stairs, and locomotion level  PARTICIPATION LIMITATIONS: cleaning, shopping, community activity, and yard work  PERSONAL FACTORS: Age, Fitness, and Time since onset of injury/illness/exacerbation are also affecting patient's functional outcome.   REHAB POTENTIAL: Good  CLINICAL DECISION MAKING: Evolving/moderate complexity  EVALUATION COMPLEXITY: Moderate   GOALS: Goals reviewed with patient? Yes  SHORT TERM GOALS: Target date: 03/04/24  Independent with initial HEP Baseline: Goal status: met 02/29/24   LONG TERM GOALS: Target date: 05/11/24  Independent with advanced HEP Baseline:  Goal status: INITIAL  2.  Understand proper posture and body mechanics Baseline:  Goal status: ongoing 02/29/24  3.  Decrease pain 50% Baseline:  Goal status: INITIAL  4.  Increase lumbar ROM 25% Baseline:  Goal status: met 02/29/24  5.  Report  able to clean her house Baseline: currently is having someone clean Goal status: INITIAL  6.  Able to shop for groceries Baseline: family members are currently doing this Goal status: INITIAL  PLAN:  PT FREQUENCY: 2x/week  PT DURATION: 12 weeks  PLANNED INTERVENTIONS: 97164- PT Re-evaluation, 97110-Therapeutic exercises, 97530- Therapeutic activity, [id number]- Neuromuscular re-education, 97535- Self Care, [id number]- Manual therapy, [id number]- Gait training, [phone]- Electrical stimulation (unattended), [id number]- Ultrasound, [id number]- Traction (mechanical), [id number]- Ionotophoresis 4mg /ml Dexamethasone, Patient/Family education, Balance training, Stair training, Taping, Joint mobilization, Cryotherapy, and Moist heat.  PLAN FOR NEXT SESSION: see how she is doing , advance exercises as needed. Could try tape but I worry about skin issues since she is so tender to touch   MAINLAND OZELL ORN, PT 02/29/2024, 1:18 PM

## 2024-03-05 ENCOUNTER — Ambulatory Visit

## 2024-03-05 ENCOUNTER — Other Ambulatory Visit: Payer: Self-pay

## 2024-03-05 DIAGNOSIS — R2689 Other abnormalities of gait and mobility: Secondary | ICD-10-CM

## 2024-03-05 DIAGNOSIS — M62830 Muscle spasm of back: Secondary | ICD-10-CM

## 2024-03-05 DIAGNOSIS — M5459 Other low back pain: Secondary | ICD-10-CM

## 2024-03-05 DIAGNOSIS — M6281 Muscle weakness (generalized): Secondary | ICD-10-CM

## 2024-03-05 NOTE — Therapy (Signed)
 OUTPATIENT PHYSICAL THERAPY THORACOLUMBAR EVALUATION   Patient Name: Sheila Hanson MRN: [id number] DOB:07-25-1934, 89 y.o., female Today's Date: 03/05/2024  END OF SESSION:  PT End of Session - 03/05/24 1438     Visit Number 6    Date for PT Re-Evaluation 05/11/24    Authorization Type Medicare    PT Start Time 1430    PT Stop Time 1513    PT Time Calculation (min) 43 min    Activity Tolerance Patient tolerated treatment well    Behavior During Therapy [facility] for tasks assessed/performed          Past Medical History:  Diagnosis Date   Cirrhosis (HCC)    Colon adenoma 08/16/2005   Coronary artery disease    Diabetes mellitus without complication (HCC)    GERD (gastroesophageal reflux disease)    Humeral head fracture    Hypercholesteremia    Hypertension    Osteopenia    Positive PPD    Varicose veins    Wrist fracture    History reviewed. No pertinent surgical history. Patient Active Problem List   Diagnosis Date Noted   Pain due to onychomycosis of toenails of both feet 01/06/2022   Type 2 diabetes mellitus with vascular disease (HCC) 01/06/2022    PCP: Charlott, MD  REFERRING PROVIDER: [place]and, PA  REFERRING DIAG: right back and flank pain  Rationale for Evaluation and Treatment: Rehabilitation  THERAPY DIAG:  Other low back pain  Muscle spasm of back  Muscle weakness (generalized)  Other abnormalities of gait and mobility  ONSET DATE: 01/16/24  SUBJECTIVE:                                                                                                                                                                                           SUBJECTIVE STATEMENT:  Still very sore and tender, took two tylenol  last night, I do think I am getting a little better, just touching it hurts and lying on side  Patient is a 89 y.o. female who was seen today for physical therapy evaluation and treatment for low back and right flank pain.  She reports that  she did care for her husband who passed away in July 26, 2024.  She reports that she wore her self out with keeping him at home.  She does have some liver issues.  She does recall lifting about 45# and really caused a pulling  PERTINENT HISTORY:  See above  PAIN:  Are you having pain? Yes: NPRS scale: 1/10 Pain location: back  Pain description: sore, spasm Aggravating factors: standing, bending, shopping pain will go up to 9-10/10 Relieving factors: warm shower  at best pain a 3-4/10  PRECAUTIONS: None  RED FLAGS: None   WEIGHT BEARING RESTRICTIONS: No  FALLS:  Has patient fallen in last 6 months? No  LIVING ENVIRONMENT: Lives with: lives alone Lives in: House/apartment Stairs: has a ramp into the home, no stairs in the home Has following equipment at home: Single point cane and Shower bench  OCCUPATION: retired  PLOF: Independent walks daily with a SPC 15 minutes, was shopping and cleaning house, did some plant care  PATIENT GOALS: have less pain  NEXT MD VISIT: none  OBJECTIVE:  Note: Objective measures were completed at Evaluation unless otherwise noted.  DIAGNOSTIC FINDINGS:  No kidney stones  PATIENT SURVEYS:  ODI: 60%  COGNITION: Overall cognitive status: Within functional limits for tasks assessed     SENSATION: WFL  MUSCLE LENGTH: Mild tightness in the HS, calves and piriformis POSTURE: rounded shoulders, forward head, and decreased lumbar lordosis  PALPATION: She is very tight and tender in the low back and into the flank bilaterally  LUMBAR ROM:   AROM eval  Flexion Decreased 50%  Extension 50%  Right lateral flexion 50%  Left lateral flexion 50%  Right rotation 50%  Left rotation    (Blank rows = not tested)  LOWER EXTREMITY MMT   all increase pain  Active  Right eval Left eval  Hip flexion 4- 4-  Hip extension    Hip abduction    Hip adduction    Hip internal rotation    Hip external rotation    Knee flexion 4 4  Knee extension 4-  4-  Ankle dorsiflexion    Ankle plantarflexion    Ankle inversion    Ankle eversion     (Blank rows = not tested)  LOWER EXTREMITY ROM:  WNL    LUMBAR SPECIAL TESTS:  Straight leg raise test: Positive and Slump test: Positive  FUNCTIONAL TESTS:  5 times sit to stand: 25 seconds Timed up and go (TUG): 22 seconds with a SPC 3 minute walk test: not tested  GAIT: Distance walked: 50 feet Assistive device utilized: Single point cane Level of assistance: Modified independence Comments: slow antalgic on the right, mild unsteadiness with turning  TREATMENT DATE:  03/05/24: L side lying for cross friction massage R quadratus lumborum L side lying with towel folded under L lumbar region, pt performing thoracic open books, therapist providing some downward directional pressure R pelvis to engage gapping/ side bending R lat flank Standing wall slides, B shoulder flexion up wall to extend and decompress thoracic spine Standing in door frame for R flank stretch, shoulder abd with L trunk side bending Seated forward rolls and rotation hands on 85 cm ball   After session moist heat R lat flank    02/29/24 Nustep level 5 x 6 minutes Feet on ball K2C, rotation, bridge, isometric abs Red tband row Red tband extension Bridge with clamshell red tband  MHP/estim to the right flank area to see if this would help with the pain and tenderness   02/27/24 NuStep L4 x 6 min S2S w/ OHP yellow ball 2x10 Rows & Lats 15lb 2x10 Passive stretching to HS,Piriformis, ITB, and K2C  02/21/24 NuStep L4 x 6 min Sit to stand holding red ball  Rows & Ext green 2x10  STM to R flank area Passive stretching to HS,Piriformis, ITB, and [facility]   02/09/24 Evaluation  PATIENT EDUCATION:  Education details: HEP Person educated: Patient Education method: Programmer, multimedia, Facilities manager, Verbal  cues, and Handouts Education comprehension: verbalized understanding  HOME EXERCISE PROGRAM: Access Code: [email or web address] URL: [email or web address] Date: 03/05/2024 Prepared by: Hailly Fess  Exercises - Seated Shoulder Shrugs  - 1 x daily - 7 x weekly - 2 sets - 10 reps - 3 hold - Seated Scapular Retraction  - 1 x daily - 7 x weekly - 2 sets - 10 reps - 3 hold - Supine Posterior Pelvic Tilt  - 1 x daily - 7 x weekly - 2 sets - 10 reps - 3 hold - Hooklying Single Knee to Chest Stretch  - 1 x daily - 7 x weekly - 2 sets - 10 reps - 5 hold - Supine Lower Trunk Rotation  - 1 x daily - 7 x weekly - 2 sets - 10 reps - 5 hold - Standing Sidebends  - 1 x daily - 7 x weekly - 3 sets - 10 reps - Sidelying Open Book Thoracic Lumbar Rotation and Extension  - 1 x daily - 7 x weekly - 3 sets - 10 reps Access Code: [email or web address] URL: [email or web address] Date: 02/09/2024 Prepared by: Ozell Mainland  Exercises - Seated Shoulder Shrugs  - 1 x daily - 7 x weekly - 2 sets - 10 reps - 3 hold - Seated Scapular Retraction  - 1 x daily - 7 x weekly - 2 sets - 10 reps - 3 hold - Supine Posterior Pelvic Tilt  - 1 x daily - 7 x weekly - 2 sets - 10 reps - 3 hold - Hooklying Single Knee to Chest Stretch  - 1 x daily - 7 x weekly - 2 sets - 10 reps - 5 hold - Supine Lower Trunk Rotation  - 1 x daily - 7 x weekly - 2 sets - 10 reps - 5 hold  ASSESSMENT:  CLINICAL IMPRESSION: Patient is a 89 y.o. female who participated today in physical therapy treatment for low back and right flank pain. Today we focused on positioning and stretching techniques to open her R flank which she tolerated well. Still had some intermittent random R flank and intermittent abdominal pain while at rest.   Provided with additional pictures of the other stretches to try.  She should continue to benefit from skilled PT to address.  OBJECTIVE IMPAIRMENTS: Abnormal gait, cardiopulmonary status limiting activity, decreased  activity tolerance, decreased balance, decreased endurance, decreased mobility, difficulty walking, decreased ROM, decreased strength, increased fascial restrictions, increased muscle spasms, impaired flexibility, improper body mechanics, postural dysfunction, and pain.   ACTIVITY LIMITATIONS: carrying, lifting, bending, sitting, standing, sleeping, stairs, and locomotion level  PARTICIPATION LIMITATIONS: cleaning, shopping, community activity, and yard work  PERSONAL FACTORS: Age, Fitness, and Time since onset of injury/illness/exacerbation are also affecting patient's functional outcome.   REHAB POTENTIAL: Good  CLINICAL DECISION MAKING: Evolving/moderate complexity  EVALUATION COMPLEXITY: Moderate   GOALS: Goals reviewed with patient? Yes  SHORT TERM GOALS: Target date: 03/04/24  Independent with initial HEP Baseline: Goal status: met 02/29/24   LONG TERM GOALS: Target date: 05/11/24  Independent with advanced HEP Baseline:  Goal status: INITIAL  2.  Understand proper posture and body mechanics Baseline:  Goal status: ongoing 02/29/24  3.  Decrease pain 50% Baseline:  Goal status: INITIAL  4.  Increase lumbar ROM 25% Baseline:  Goal status: met 02/29/24  5.  Report able to clean her house Baseline: currently is having someone clean Goal status: INITIAL  6.  Able to shop for groceries Baseline: family members are currently doing this Goal status: INITIAL  PLAN:  PT FREQUENCY: 2x/week  PT DURATION: 12 weeks  PLANNED INTERVENTIONS: 97164- PT Re-evaluation, 97110-Therapeutic exercises, 97530- Therapeutic activity, [id number]- Neuromuscular re-education, 97535- Self Care, 02859- Manual therapy, [id number]- Gait training, [phone]- Electrical stimulation (unattended), [id number]- Ultrasound, [id number]- Traction (mechanical), [id number]- Ionotophoresis 4mg /ml Dexamethasone, Patient/Family education, Balance training, Stair training, Taping, Joint mobilization, Cryotherapy, and Moist  heat.  PLAN FOR NEXT SESSION: see how she is doing , advance exercises as needed. Could try tape but I worry about skin issues since she is so tender to touch   Aliciana Ricciardi L Gracilyn Gunia, PT, DPT, OCS 03/05/2024, 2:48 PM

## 2024-03-07 ENCOUNTER — Encounter: Payer: Self-pay | Admitting: Physical Therapy

## 2024-03-07 ENCOUNTER — Ambulatory Visit: Admitting: Physical Therapy

## 2024-03-07 DIAGNOSIS — R2689 Other abnormalities of gait and mobility: Secondary | ICD-10-CM

## 2024-03-07 DIAGNOSIS — M62830 Muscle spasm of back: Secondary | ICD-10-CM

## 2024-03-07 DIAGNOSIS — M5459 Other low back pain: Secondary | ICD-10-CM

## 2024-03-07 DIAGNOSIS — M6281 Muscle weakness (generalized): Secondary | ICD-10-CM

## 2024-03-07 NOTE — Therapy (Signed)
 OUTPATIENT PHYSICAL THERAPY THORACOLUMBAR EVALUATION   Patient Name: Sheila Hanson MRN: [id number] DOB:[birth year], 89 y.o., female Today's Date: 03/07/2024  END OF SESSION:  PT End of Session - 03/07/24 1320     Visit Number 7    Date for PT Re-Evaluation 05/11/24    Authorization Type Medicare    PT Start Time 1318    PT Stop Time 1400    PT Time Calculation (min) 42 min    Activity Tolerance Patient tolerated treatment well    Behavior During Therapy [facility] for tasks assessed/performed          Past Medical History:  Diagnosis Date   Cirrhosis (HCC)    Colon adenoma 08/16/2005   Coronary artery disease    Diabetes mellitus without complication (HCC)    GERD (gastroesophageal reflux disease)    Humeral head fracture    Hypercholesteremia    Hypertension    Osteopenia    Positive PPD    Varicose veins    Wrist fracture    History reviewed. No pertinent surgical history. Patient Active Problem List   Diagnosis Date Noted   Pain due to onychomycosis of toenails of both feet 01/06/2022   Type 2 diabetes mellitus with vascular disease (HCC) 01/06/2022    PCP: Charlott, MD  REFERRING PROVIDER: [place], PA  REFERRING DIAG: right back and flank pain  Rationale for Evaluation and Treatment: Rehabilitation  THERAPY DIAG:  Other low back pain  Muscle spasm of back  Muscle weakness (generalized)  Other abnormalities of gait and mobility  ONSET DATE: 01/16/24  SUBJECTIVE:                                                                                                                                                                                           SUBJECTIVE STATEMENT:  I think it is helping, less pain overall and some better walking  Patient is a 89 y.o. female who was seen today for physical therapy evaluation and treatment for low back and right flank pain.  She reports that she did care for her husband who passed away in July 29, 2024.  She reports that  she wore her self out with keeping him at home.  She does have some liver issues.  She does recall lifting about 45# and really caused a pulling  PERTINENT HISTORY:  See above  PAIN:  Are you having pain? Yes: NPRS scale: 1/10 Pain location: back  Pain description: sore, spasm Aggravating factors: standing, bending, shopping pain will go up to 9-10/10 Relieving factors: warm shower at best pain a 3-4/10  PRECAUTIONS: None  RED FLAGS: None   WEIGHT  BEARING RESTRICTIONS: No  FALLS:  Has patient fallen in last 6 months? No  LIVING ENVIRONMENT: Lives with: lives alone Lives in: House/apartment Stairs: has a ramp into the home, no stairs in the home Has following equipment at home: Single point cane and Shower bench  OCCUPATION: retired  PLOF: Independent walks daily with a SPC 15 minutes, was shopping and cleaning house, did some plant care  PATIENT GOALS: have less pain  NEXT MD VISIT: none  OBJECTIVE:  Note: Objective measures were completed at Evaluation unless otherwise noted.  DIAGNOSTIC FINDINGS:  No kidney stones  PATIENT SURVEYS:  ODI: 60%  COGNITION: Overall cognitive status: Within functional limits for tasks assessed     SENSATION: WFL  MUSCLE LENGTH: Mild tightness in the HS, calves and piriformis POSTURE: rounded shoulders, forward head, and decreased lumbar lordosis  PALPATION: She is very tight and tender in the low back and into the flank bilaterally  LUMBAR ROM:   AROM eval  Flexion Decreased 50%  Extension 50%  Right lateral flexion 50%  Left lateral flexion 50%  Right rotation 50%  Left rotation    (Blank rows = not tested)  LOWER EXTREMITY MMT   all increase pain  Active  Right eval Left eval  Hip flexion 4- 4-  Hip extension    Hip abduction    Hip adduction    Hip internal rotation    Hip external rotation    Knee flexion 4 4  Knee extension 4- 4-  Ankle dorsiflexion    Ankle plantarflexion    Ankle inversion     Ankle eversion     (Blank rows = not tested)  LOWER EXTREMITY ROM:  WNL    LUMBAR SPECIAL TESTS:  Straight leg raise test: Positive and Slump test: Positive  FUNCTIONAL TESTS:  5 times sit to stand: 25 seconds Timed up and go (TUG): 22 seconds with a SPC 3 minute walk test: not tested  GAIT: Distance walked: 50 feet Assistive device utilized: Single point cane Level of assistance: Modified independence Comments: slow antalgic on the right, mild unsteadiness with turning  TREATMENT DATE:  03/07/24 Left sidelying STM to the right lumbar area and into the right flank Some gentle passive stretch of the trunk and LE's Feet on ball K2C, rotation, small bridge, isometric abs Green tband clamshells Ball b/n knees squeeze Red tband row Red tband extension cues for posture an core  03/05/24: L side lying for cross friction massage R quadratus lumborum L side lying with towel folded under L lumbar region, pt performing thoracic open books, therapist providing some downward directional pressure R pelvis to engage gapping/ side bending R lat flank Standing wall slides, B shoulder flexion up wall to extend and decompress thoracic spine Standing in door frame for R flank stretch, shoulder abd with L trunk side bending Seated forward rolls and rotation hands on 85 cm ball   After session moist heat R lat flank    02/29/24 Nustep level 5 x 6 minutes Feet on ball K2C, rotation, bridge, isometric abs Red tband row Red tband extension Bridge with clamshell red tband  MHP/estim to the right flank area to see if this would help with the pain and tenderness   02/27/24 NuStep L4 x 6 min S2S w/ OHP yellow ball 2x10 Rows & Lats 15lb 2x10 Passive stretching to HS,Piriformis, ITB, and K2C  02/21/24 NuStep L4 x 6 min Sit to stand holding red ball  Rows & Ext green 2x10  STM  to R flank area Passive stretching to HS,Piriformis, ITB, and [facility]   02/09/24 Evaluation                                                                                                                                  PATIENT EDUCATION:  Education details: HEP Person educated: Patient Education method: Programmer, multimedia, Facilities manager, Verbal cues, and Handouts Education comprehension: verbalized understanding  HOME EXERCISE PROGRAM: Access Code: [email or web address] URL: [email or web address] Date: 03/05/2024 Prepared by: Amy Speaks  Exercises - Seated Shoulder Shrugs  - 1 x daily - 7 x weekly - 2 sets - 10 reps - 3 hold - Seated Scapular Retraction  - 1 x daily - 7 x weekly - 2 sets - 10 reps - 3 hold - Supine Posterior Pelvic Tilt  - 1 x daily - 7 x weekly - 2 sets - 10 reps - 3 hold - Hooklying Single Knee to Chest Stretch  - 1 x daily - 7 x weekly - 2 sets - 10 reps - 5 hold - Supine Lower Trunk Rotation  - 1 x daily - 7 x weekly - 2 sets - 10 reps - 5 hold - Standing Sidebends  - 1 x daily - 7 x weekly - 3 sets - 10 reps - Sidelying Open Book Thoracic Lumbar Rotation and Extension  - 1 x daily - 7 x weekly - 3 sets - 10 reps Access Code: [email or web address] URL: [email or web address] Date: 02/09/2024 Prepared by: Ozell Mainland  Exercises - Seated Shoulder Shrugs  - 1 x daily - 7 x weekly - 2 sets - 10 reps - 3 hold - Seated Scapular Retraction  - 1 x daily - 7 x weekly - 2 sets - 10 reps - 3 hold - Supine Posterior Pelvic Tilt  - 1 x daily - 7 x weekly - 2 sets - 10 reps - 3 hold - Hooklying Single Knee to Chest Stretch  - 1 x daily - 7 x weekly - 2 sets - 10 reps - 5 hold - Supine Lower Trunk Rotation  - 1 x daily - 7 x weekly - 2 sets - 10 reps - 5 hold  ASSESSMENT:  CLINICAL IMPRESSION: Patient is a 89 y.o. female who participated today in physical therapy treatment for low back and right flank pain. Worked on STM to the flank and lumbar area.  Did some light stretching and some light strengthening, She needed cues for posture and core activation to re-educate the mms.  She is much  less tender to day She should continue to benefit from skilled PT to address.  OBJECTIVE IMPAIRMENTS: Abnormal gait, cardiopulmonary status limiting activity, decreased activity tolerance, decreased balance, decreased endurance, decreased mobility, difficulty walking, decreased ROM, decreased strength, increased fascial restrictions, increased muscle spasms, impaired flexibility, improper body mechanics, postural dysfunction, and pain.   ACTIVITY LIMITATIONS: carrying, lifting, bending, sitting, standing, sleeping, stairs, and locomotion level  PARTICIPATION LIMITATIONS: cleaning, shopping, community  activity, and yard work  PERSONAL FACTORS: Age, Fitness, and Time since onset of injury/illness/exacerbation are also affecting patient's functional outcome.   REHAB POTENTIAL: Good  CLINICAL DECISION MAKING: Evolving/moderate complexity  EVALUATION COMPLEXITY: Moderate   GOALS: Goals reviewed with patient? Yes  SHORT TERM GOALS: Target date: 03/04/24  Independent with initial HEP Baseline: Goal status: met 02/29/24   LONG TERM GOALS: Target date: 05/11/24  Independent with advanced HEP Baseline:  Goal status: INITIAL  2.  Understand proper posture and body mechanics Baseline:  Goal status: ongoing 02/29/24  3.  Decrease pain 50% Baseline:  Goal status: progressing 03/07/24  4.  Increase lumbar ROM 25% Baseline:  Goal status: met 02/29/24  5.  Report able to clean her house Baseline: currently is having someone clean Goal status: INITIAL  6.  Able to shop for groceries Baseline: family members are currently doing this Goal status: INITIAL  PLAN:  PT FREQUENCY: 2x/week  PT DURATION: 12 weeks  PLANNED INTERVENTIONS: 97164- PT Re-evaluation, 97110-Therapeutic exercises, 97530- Therapeutic activity, [id number]- Neuromuscular re-education, 97535- Self Care, [id number]- Manual therapy, [id number]- Gait training, [phone]- Electrical stimulation (unattended), [id number]- Ultrasound, [id number]-  Traction (mechanical), [id number]- Ionotophoresis 4mg /ml Dexamethasone, Patient/Family education, Balance training, Stair training, Taping, Joint mobilization, Cryotherapy, and Moist heat.  PLAN FOR NEXT SESSION: see how she is doing , advance exercises as needed.    OBADIAH OZELL ORN, PT,  03/07/2024, 1:20 PM

## 2024-03-12 ENCOUNTER — Ambulatory Visit: Admitting: Physical Therapy

## 2024-03-12 ENCOUNTER — Encounter: Payer: Self-pay | Admitting: Physical Therapy

## 2024-03-12 DIAGNOSIS — M62830 Muscle spasm of back: Secondary | ICD-10-CM

## 2024-03-12 DIAGNOSIS — M5459 Other low back pain: Secondary | ICD-10-CM

## 2024-03-12 DIAGNOSIS — M6281 Muscle weakness (generalized): Secondary | ICD-10-CM | POA: Diagnosis not present

## 2024-03-12 DIAGNOSIS — R2689 Other abnormalities of gait and mobility: Secondary | ICD-10-CM

## 2024-03-12 NOTE — Therapy (Signed)
 OUTPATIENT PHYSICAL THERAPY THORACOLUMBAR TREATMENT   Patient Name: Sheila Hanson MRN: [id number] DOB:07-Sep-1933, 89 y.o., female Today's Date: 03/12/2024  END OF SESSION:  PT End of Session - 03/12/24 1320     Visit Number 8    Date for PT Re-Evaluation 05/11/24    Authorization Type Medicare    PT Start Time 1316    PT Stop Time 1359    PT Time Calculation (min) 43 min    Activity Tolerance Patient tolerated treatment well    Behavior During Therapy [facility] for tasks assessed/performed          Past Medical History:  Diagnosis Date   Cirrhosis (HCC)    Colon adenoma 08/16/2005   Coronary artery disease    Diabetes mellitus without complication (HCC)    GERD (gastroesophageal reflux disease)    Humeral head fracture    Hypercholesteremia    Hypertension    Osteopenia    Positive PPD    Varicose veins    Wrist fracture    History reviewed. No pertinent surgical history. Patient Active Problem List   Diagnosis Date Noted   Pain due to onychomycosis of toenails of both feet 01/06/2022   Type 2 diabetes mellitus with vascular disease (HCC) 01/06/2022    PCP: Charlott, MD  REFERRING PROVIDER: [place], PA  REFERRING DIAG: right back and flank pain  Rationale for Evaluation and Treatment: Rehabilitation  THERAPY DIAG:  Other low back pain  Muscle spasm of back  Muscle weakness (generalized)  Other abnormalities of gait and mobility  ONSET DATE: 01/16/24  SUBJECTIVE:                                                                                                                                                                                           SUBJECTIVE STATEMENT:  I think it is helping, less pain overall and some better walking, I am walking a little more, I have not had to stop recently due to pain  Patient is a 89 y.o. female who was seen today for physical therapy evaluation and treatment for low back and right flank pain.  She reports that she  did care for her husband who passed away in July 09, 2024.  She reports that she wore her self out with keeping him at home.  She does have some liver issues.  She does recall lifting about 45# and really caused a pulling  PERTINENT HISTORY:  See above  PAIN:  Are you having pain? Yes: NPRS scale: 1/10 Pain location: back  Pain description: sore, spasm Aggravating factors: standing, bending, shopping pain will go up to 9-10/10 Relieving factors: warm  shower at best pain a 3-4/10  PRECAUTIONS: None  RED FLAGS: None   WEIGHT BEARING RESTRICTIONS: No  FALLS:  Has patient fallen in last 6 months? No  LIVING ENVIRONMENT: Lives with: lives alone Lives in: House/apartment Stairs: has a ramp into the home, no stairs in the home Has following equipment at home: Single point cane and Shower bench  OCCUPATION: retired  PLOF: Independent walks daily with a SPC 15 minutes, was shopping and cleaning house, did some plant care  PATIENT GOALS: have less pain  NEXT MD VISIT: none  OBJECTIVE:  Note: Objective measures were completed at Evaluation unless otherwise noted.  DIAGNOSTIC FINDINGS:  No kidney stones  PATIENT SURVEYS:  ODI: 60%  COGNITION: Overall cognitive status: Within functional limits for tasks assessed     SENSATION: WFL  MUSCLE LENGTH: Mild tightness in the HS, calves and piriformis POSTURE: rounded shoulders, forward head, and decreased lumbar lordosis  PALPATION: She is very tight and tender in the low back and into the flank bilaterally  LUMBAR ROM:   AROM eval  Flexion Decreased 50%  Extension 50%  Right lateral flexion 50%  Left lateral flexion 50%  Right rotation 50%  Left rotation    (Blank rows = not tested)  LOWER EXTREMITY MMT   all increase pain  Active  Right eval Left eval  Hip flexion 4- 4-  Hip extension    Hip abduction    Hip adduction    Hip internal rotation    Hip external rotation    Knee flexion 4 4  Knee extension 4- 4-   Ankle dorsiflexion    Ankle plantarflexion    Ankle inversion    Ankle eversion     (Blank rows = not tested)  LOWER EXTREMITY ROM:  WNL    LUMBAR SPECIAL TESTS:  Straight leg raise test: Positive and Slump test: Positive  FUNCTIONAL TESTS:  5 times sit to stand: 25 seconds Timed up and go (TUG): 22 seconds with a SPC 3 minute walk test: not tested  GAIT: Distance walked: 50 feet Assistive device utilized: Single point cane Level of assistance: Modified independence Comments: slow antalgic on the right, mild unsteadiness with turning  TREATMENT DATE:  03/12/24 Nustep level 5 x 6 minutes Red tband rows Red tband extension Green tband clamshells Ball b/n knees squeeze Feet on ball K2C, rotation, bridge, isometric abs Left side lying STM to the right flank lumbar area  03/07/24 Left sidelying STM to the right lumbar area and into the right flank Some gentle passive stretch of the trunk and LE's Feet on ball K2C, rotation, small bridge, isometric abs Green tband clamshells Ball b/n knees squeeze Red tband row Red tband extension cues for posture an core  03/05/24: L side lying for cross friction massage R quadratus lumborum L side lying with towel folded under L lumbar region, pt performing thoracic open books, therapist providing some downward directional pressure R pelvis to engage gapping/ side bending R lat flank Standing wall slides, B shoulder flexion up wall to extend and decompress thoracic spine Standing in door frame for R flank stretch, shoulder abd with L trunk side bending Seated forward rolls and rotation hands on 85 cm ball   After session moist heat R lat flank    02/29/24 Nustep level 5 x 6 minutes Feet on ball K2C, rotation, bridge, isometric abs Red tband row Red tband extension Bridge with clamshell red tband  MHP/estim to the right flank area to see if  this would help with the pain and tenderness   02/27/24 NuStep L4 x 6 min S2S w/ OHP yellow  ball 2x10 Rows & Lats 15lb 2x10 Passive stretching to HS,Piriformis, ITB, and K2C  02/21/24 NuStep L4 x 6 min Sit to stand holding red ball  Rows & Ext green 2x10  STM to R flank area Passive stretching to HS,Piriformis, ITB, and [facility]   02/09/24 Evaluation                                                                                                                                 PATIENT EDUCATION:  Education details: HEP Person educated: Patient Education method: Programmer, multimedia, Facilities manager, Verbal cues, and Handouts Education comprehension: verbalized understanding  HOME EXERCISE PROGRAM: Access Code: [email or web address] URL: [email or web address] Date: 03/05/2024 Prepared by: Amy Speaks  Exercises - Seated Shoulder Shrugs  - 1 x daily - 7 x weekly - 2 sets - 10 reps - 3 hold - Seated Scapular Retraction  - 1 x daily - 7 x weekly - 2 sets - 10 reps - 3 hold - Supine Posterior Pelvic Tilt  - 1 x daily - 7 x weekly - 2 sets - 10 reps - 3 hold - Hooklying Single Knee to Chest Stretch  - 1 x daily - 7 x weekly - 2 sets - 10 reps - 5 hold - Supine Lower Trunk Rotation  - 1 x daily - 7 x weekly - 2 sets - 10 reps - 5 hold - Standing Sidebends  - 1 x daily - 7 x weekly - 3 sets - 10 reps - Sidelying Open Book Thoracic Lumbar Rotation and Extension  - 1 x daily - 7 x weekly - 3 sets - 10 reps Access Code: [email or web address] URL: [email or web address] Date: 02/09/2024 Prepared by: Ozell Mainland  Exercises - Seated Shoulder Shrugs  - 1 x daily - 7 x weekly - 2 sets - 10 reps - 3 hold - Seated Scapular Retraction  - 1 x daily - 7 x weekly - 2 sets - 10 reps - 3 hold - Supine Posterior Pelvic Tilt  - 1 x daily - 7 x weekly - 2 sets - 10 reps - 3 hold - Hooklying Single Knee to Chest Stretch  - 1 x daily - 7 x weekly - 2 sets - 10 reps - 5 hold - Supine Lower Trunk Rotation  - 1 x daily - 7 x weekly - 2 sets - 10 reps - 5 hold  ASSESSMENT:  CLINICAL IMPRESSION: Patient  is a 89 y.o. female who participated today in physical therapy treatment for low back and right flank pain. She reports that she has been able to walk better and with less pain, able to walk to get mail without stopping due to pain.  She also reports that she was able to can some tomatoes yesterday which required a lot of standing.  She should continue to benefit from skilled  PT to address.  OBJECTIVE IMPAIRMENTS: Abnormal gait, cardiopulmonary status limiting activity, decreased activity tolerance, decreased balance, decreased endurance, decreased mobility, difficulty walking, decreased ROM, decreased strength, increased fascial restrictions, increased muscle spasms, impaired flexibility, improper body mechanics, postural dysfunction, and pain.   ACTIVITY LIMITATIONS: carrying, lifting, bending, sitting, standing, sleeping, stairs, and locomotion level  PARTICIPATION LIMITATIONS: cleaning, shopping, community activity, and yard work  PERSONAL FACTORS: Age, Fitness, and Time since onset of injury/illness/exacerbation are also affecting patient's functional outcome.   REHAB POTENTIAL: Good  CLINICAL DECISION MAKING: Evolving/moderate complexity  EVALUATION COMPLEXITY: Moderate   GOALS: Goals reviewed with patient? Yes  SHORT TERM GOALS: Target date: 03/04/24  Independent with initial HEP Baseline: Goal status: met 02/29/24   LONG TERM GOALS: Target date: 05/11/24  Independent with advanced HEP Baseline:  Goal status: ongoing 03/12/24  2.  Understand proper posture and body mechanics Baseline:  Goal status: ongoing 02/29/24  3.  Decrease pain 50% Baseline:  Goal status: progressing 03/07/24  4.  Increase lumbar ROM 25% Baseline:  Goal status: met 02/29/24  5.  Report able to clean her house Baseline: currently is having someone clean Goal status: ongoing 03/12/24  6.  Able to shop for groceries Baseline: family members are currently doing this Goal status:  INITIAL  PLAN:  PT FREQUENCY: 2x/week  PT DURATION: 12 weeks  PLANNED INTERVENTIONS: 97164- PT Re-evaluation, 97110-Therapeutic exercises, 97530- Therapeutic activity, [id number]- Neuromuscular re-education, 97535- Self Care, 02859- Manual therapy, [id number]- Gait training, [phone]- Electrical stimulation (unattended), [id number]- Ultrasound, [id number]- Traction (mechanical), [id number]- Ionotophoresis 4mg /ml Dexamethasone, Patient/Family education, Balance training, Stair training, Taping, Joint mobilization, Cryotherapy, and Moist heat.  PLAN FOR NEXT SESSION: see how she is doing , advance exercises as needed.    OBADIAH OZELL ORN, PT,  03/12/2024, 1:21 PM

## 2024-03-14 ENCOUNTER — Ambulatory Visit: Admitting: Physical Therapy

## 2024-03-14 ENCOUNTER — Encounter: Payer: Self-pay | Admitting: Physical Therapy

## 2024-03-14 DIAGNOSIS — M62830 Muscle spasm of back: Secondary | ICD-10-CM

## 2024-03-14 DIAGNOSIS — M6281 Muscle weakness (generalized): Secondary | ICD-10-CM

## 2024-03-14 DIAGNOSIS — M5459 Other low back pain: Secondary | ICD-10-CM | POA: Diagnosis not present

## 2024-03-14 DIAGNOSIS — R2689 Other abnormalities of gait and mobility: Secondary | ICD-10-CM | POA: Diagnosis not present

## 2024-03-14 NOTE — Therapy (Signed)
 OUTPATIENT PHYSICAL THERAPY THORACOLUMBAR TREATMENT   Patient Name: Sheila Hanson MRN: [id number] DOB:1933-12-04, 89 y.o., female Today's Date: 03/14/2024  END OF SESSION:  PT End of Session - 03/14/24 1302     Visit Number 9    Date for PT Re-Evaluation 05/11/24    PT Start Time 1302    PT Stop Time 1345    PT Time Calculation (min) 43 min    Activity Tolerance Patient tolerated treatment well    Behavior During Therapy [facility] for tasks assessed/performed          Past Medical History:  Diagnosis Date   Cirrhosis (HCC)    Colon adenoma 08/16/2005   Coronary artery disease    Diabetes mellitus without complication (HCC)    GERD (gastroesophageal reflux disease)    Humeral head fracture    Hypercholesteremia    Hypertension    Osteopenia    Positive PPD    Varicose veins    Wrist fracture    History reviewed. No pertinent surgical history. Patient Active Problem List   Diagnosis Date Noted   Pain due to onychomycosis of toenails of both feet 01/06/2022   Type 2 diabetes mellitus with vascular disease (HCC) 01/06/2022    PCP: Charlott, MD  REFERRING PROVIDER: [place], PA  REFERRING DIAG: right back and flank pain  Rationale for Evaluation and Treatment: Rehabilitation  THERAPY DIAG:  Other low back pain  Muscle weakness (generalized)  Muscle spasm of back  ONSET DATE: 01/16/24  SUBJECTIVE:                                                                                                                                                                                           SUBJECTIVE STATEMENT:  Im fairly good  Patient is a 89 y.o. female who was seen today for physical therapy evaluation and treatment for low back and right flank pain.  She reports that she did care for her husband who passed away in Jun 23, 2024.  She reports that she wore her self out with keeping him at home.  She does have some liver issues.  She does recall lifting about 45# and  really caused a pulling  PERTINENT HISTORY:  See above  PAIN:  Are you having pain? Yes: NPRS scale: /10 Pain location: back  Pain description: sore, spasm Aggravating factors: standing, bending, shopping pain will go up to 9-10/10 Relieving factors: warm shower at best pain a 3-4/10  PRECAUTIONS: None  RED FLAGS: None   WEIGHT BEARING RESTRICTIONS: No  FALLS:  Has patient fallen in last 6 months? No  LIVING ENVIRONMENT: Lives with: lives alone Lives  in: House/apartment Stairs: has a ramp into the home, no stairs in the home Has following equipment at home: Single point cane and Shower bench  OCCUPATION: retired  PLOF: Independent walks daily with a SPC 15 minutes, was shopping and cleaning house, did some plant care  PATIENT GOALS: have less pain  NEXT MD VISIT: none  OBJECTIVE:  Note: Objective measures were completed at Evaluation unless otherwise noted.  DIAGNOSTIC FINDINGS:  No kidney stones  PATIENT SURVEYS:  ODI: 60%  COGNITION: Overall cognitive status: Within functional limits for tasks assessed     SENSATION: WFL  MUSCLE LENGTH: Mild tightness in the HS, calves and piriformis POSTURE: rounded shoulders, forward head, and decreased lumbar lordosis  PALPATION: She is very tight and tender in the low back and into the flank bilaterally  LUMBAR ROM:   AROM eval  Flexion Decreased 50%  Extension 50%  Right lateral flexion 50%  Left lateral flexion 50%  Right rotation 50%  Left rotation    (Blank rows = not tested)  LOWER EXTREMITY MMT   all increase pain  Active  Right eval Left eval  Hip flexion 4- 4-  Hip extension    Hip abduction    Hip adduction    Hip internal rotation    Hip external rotation    Knee flexion 4 4  Knee extension 4- 4-  Ankle dorsiflexion    Ankle plantarflexion    Ankle inversion    Ankle eversion     (Blank rows = not tested)  LOWER EXTREMITY ROM:  WNL    LUMBAR SPECIAL TESTS:  Straight leg raise  test: Positive and Slump test: Positive  FUNCTIONAL TESTS:  5 times sit to stand: 25 seconds Timed up and go (TUG): 22 seconds with a SPC 3 minute walk test: not tested  GAIT: Distance walked: 50 feet Assistive device utilized: Single point cane Level of assistance: Modified independence Comments: slow antalgic on the right, mild unsteadiness with turning  TREATMENT DATE:  03/14/24 Bike L3 x 3 min Bilat shoulder Flex 3lb WaTE x10 Seated Rows red 2x10 Shoulder Ext red 2x10 Green tband clamshells Feet on ball K2C, rotation, bridge, isometric abs Passive stretching to HS,Piriformis, ITB, and K2C  STM to R flank  03/12/24 Nustep level 5 x 6 minutes Red tband rows Red tband extension Green tband clamshells Ball b/n knees squeeze Feet on ball K2C, rotation, bridge, isometric abs Left side lying STM to the right flank lumbar area  03/07/24 Left sidelying STM to the right lumbar area and into the right flank Some gentle passive stretch of the trunk and LE's Feet on ball K2C, rotation, small bridge, isometric abs Green tband clamshells Ball b/n knees squeeze Red tband row Red tband extension cues for posture an core  03/05/24: L side lying for cross friction massage R quadratus lumborum L side lying with towel folded under L lumbar region, pt performing thoracic open books, therapist providing some downward directional pressure R pelvis to engage gapping/ side bending R lat flank Standing wall slides, B shoulder flexion up wall to extend and decompress thoracic spine Standing in door frame for R flank stretch, shoulder abd with L trunk side bending Seated forward rolls and rotation hands on 85 cm ball   After session moist heat R lat flank    02/29/24 Nustep level 5 x 6 minutes Feet on ball K2C, rotation, bridge, isometric abs Red tband row Red tband extension Bridge with clamshell red tband  MHP/estim to the  right flank area to see if this would help with the pain and  tenderness   02/27/24 NuStep L4 x 6 min S2S w/ OHP yellow ball 2x10 Rows & Lats 15lb 2x10 Passive stretching to HS,Piriformis, ITB, and K2C  02/21/24 NuStep L4 x 6 min Sit to stand holding red ball  Rows & Ext green 2x10  STM to R flank area Passive stretching to HS,Piriformis, ITB, and [facility]   02/09/24 Evaluation                                                                                                                                 PATIENT EDUCATION:  Education details: HEP Person educated: Patient Education method: Programmer, multimedia, Facilities manager, Verbal cues, and Handouts Education comprehension: verbalized understanding  HOME EXERCISE PROGRAM: Access Code: [email or web address] URL: [email or web address] Date: 03/05/2024 Prepared by: Amy Speaks  Exercises - Seated Shoulder Shrugs  - 1 x daily - 7 x weekly - 2 sets - 10 reps - 3 hold - Seated Scapular Retraction  - 1 x daily - 7 x weekly - 2 sets - 10 reps - 3 hold - Supine Posterior Pelvic Tilt  - 1 x daily - 7 x weekly - 2 sets - 10 reps - 3 hold - Hooklying Single Knee to Chest Stretch  - 1 x daily - 7 x weekly - 2 sets - 10 reps - 5 hold - Supine Lower Trunk Rotation  - 1 x daily - 7 x weekly - 2 sets - 10 reps - 5 hold - Standing Sidebends  - 1 x daily - 7 x weekly - 3 sets - 10 reps - Sidelying Open Book Thoracic Lumbar Rotation and Extension  - 1 x daily - 7 x weekly - 3 sets - 10 reps Access Code: [email or web address] URL: [email or web address] Date: 02/09/2024 Prepared by: Ozell Mainland  Exercises - Seated Shoulder Shrugs  - 1 x daily - 7 x weekly - 2 sets - 10 reps - 3 hold - Seated Scapular Retraction  - 1 x daily - 7 x weekly - 2 sets - 10 reps - 3 hold - Supine Posterior Pelvic Tilt  - 1 x daily - 7 x weekly - 2 sets - 10 reps - 3 hold - Hooklying Single Knee to Chest Stretch  - 1 x daily - 7 x weekly - 2 sets - 10 reps - 5 hold - Supine Lower Trunk Rotation  - 1 x daily - 7 x weekly - 2 sets - 10  reps - 5 hold  ASSESSMENT:  CLINICAL IMPRESSION: Patient is a 89 y.o. female who participated today in physical therapy treatment for low back and right flank pain. She continues to report improvement with decrease pain and better function.  Postural cues required with rows and extensions. Some hip weakness with bridges.  She should continue to benefit from skilled PT to address.  OBJECTIVE IMPAIRMENTS: Abnormal gait, cardiopulmonary status limiting activity, decreased activity  tolerance, decreased balance, decreased endurance, decreased mobility, difficulty walking, decreased ROM, decreased strength, increased fascial restrictions, increased muscle spasms, impaired flexibility, improper body mechanics, postural dysfunction, and pain.   ACTIVITY LIMITATIONS: carrying, lifting, bending, sitting, standing, sleeping, stairs, and locomotion level  PARTICIPATION LIMITATIONS: cleaning, shopping, community activity, and yard work  PERSONAL FACTORS: Age, Fitness, and Time since onset of injury/illness/exacerbation are also affecting patient's functional outcome.   REHAB POTENTIAL: Good  CLINICAL DECISION MAKING: Evolving/moderate complexity  EVALUATION COMPLEXITY: Moderate   GOALS: Goals reviewed with patient? Yes  SHORT TERM GOALS: Target date: 03/04/24  Independent with initial HEP Baseline: Goal status: met 02/29/24   LONG TERM GOALS: Target date: 05/11/24  Independent with advanced HEP Baseline:  Goal status: ongoing 03/12/24  2.  Understand proper posture and body mechanics Baseline:  Goal status: ongoing 02/29/24  3.  Decrease pain 50% Baseline:  Goal status: progressing 03/07/24  4.  Increase lumbar ROM 25% Baseline:  Goal status: met 02/29/24  5.  Report able to clean her house Baseline: currently is having someone clean Goal status: ongoing 03/12/24  6.  Able to shop for groceries Baseline: family members are currently doing this Goal status: INITIAL  PLAN:  PT  FREQUENCY: 2x/week  PT DURATION: 12 weeks  PLANNED INTERVENTIONS: 97164- PT Re-evaluation, 97110-Therapeutic exercises, 97530- Therapeutic activity, [id number]- Neuromuscular re-education, 97535- Self Care, [id number]- Manual therapy, [id number]- Gait training, [phone]- Electrical stimulation (unattended), [id number]- Ultrasound, [id number]- Traction (mechanical), [id number]- Ionotophoresis 4mg /ml Dexamethasone, Patient/Family education, Balance training, Stair training, Taping, Joint mobilization, Cryotherapy, and Moist heat.  PLAN FOR NEXT SESSION: see how she is doing , advance exercises as needed.    Tanda KANDICE Sorrow, PTA,  03/14/2024, 1:02 PM

## 2024-03-21 ENCOUNTER — Encounter: Payer: Self-pay | Admitting: Physical Therapy

## 2024-03-21 ENCOUNTER — Ambulatory Visit: Attending: Physician Assistant | Admitting: Physical Therapy

## 2024-03-21 DIAGNOSIS — M6281 Muscle weakness (generalized): Secondary | ICD-10-CM | POA: Diagnosis not present

## 2024-03-21 DIAGNOSIS — M62830 Muscle spasm of back: Secondary | ICD-10-CM | POA: Insufficient documentation

## 2024-03-21 DIAGNOSIS — M5459 Other low back pain: Secondary | ICD-10-CM | POA: Diagnosis not present

## 2024-03-21 DIAGNOSIS — R2689 Other abnormalities of gait and mobility: Secondary | ICD-10-CM | POA: Insufficient documentation

## 2024-03-21 NOTE — Therapy (Signed)
 OUTPATIENT PHYSICAL THERAPY THORACOLUMBAR TREATMENT Progress Note Reporting Period 02/09/24 to 03/21/24  See note below for Objective Data and Assessment of Progress/Goals.      Patient Name: RAMSHA LONIGRO MRN: [id number] DOB:Mar 31, 1934, 89 y.o., female Today's Date: 03/21/2024  END OF SESSION:  PT End of Session - 03/21/24 1344     Visit Number 10    Date for PT Re-Evaluation 05/11/24    PT Start Time 1345    PT Stop Time 1430    PT Time Calculation (min) 45 min    Activity Tolerance Patient tolerated treatment well    Behavior During Therapy [facility] for tasks assessed/performed          Past Medical History:  Diagnosis Date   Cirrhosis (HCC)    Colon adenoma 08/16/2005   Coronary artery disease    Diabetes mellitus without complication (HCC)    GERD (gastroesophageal reflux disease)    Humeral head fracture    Hypercholesteremia    Hypertension    Osteopenia    Positive PPD    Varicose veins    Wrist fracture    History reviewed. No pertinent surgical history. Patient Active Problem List   Diagnosis Date Noted   Pain due to onychomycosis of toenails of both feet 01/06/2022   Type 2 diabetes mellitus with vascular disease (HCC) 01/06/2022    PCP: Charlott, MD  REFERRING PROVIDER: [place], PA  REFERRING DIAG: right back and flank pain  Rationale for Evaluation and Treatment: Rehabilitation  THERAPY DIAG:  Other low back pain  Muscle weakness (generalized)  Muscle spasm of back  ONSET DATE: 01/16/24  SUBJECTIVE:                                                                                                                                                                                           SUBJECTIVE STATEMENT:   Im all right   Patient is a 89 y.o. female who was seen today for physical therapy evaluation and treatment for low back and right flank pain.  She reports that she did care for her husband who passed away in July 19, 2024.  She reports  that she wore her self out with keeping him at home.  She does have some liver issues.  She does recall lifting about 45# and really caused a pulling  PERTINENT HISTORY:  See above  PAIN:  Are you having pain? Yes: NPRS scale: 2/10 Pain location: back  Pain description: Discomfort Aggravating factors: standing, bending, shopping pain will go up to 9-10/10 Relieving factors: warm shower at best pain a 3-4/10  PRECAUTIONS: None  RED FLAGS: None   WEIGHT  BEARING RESTRICTIONS: No  FALLS:  Has patient fallen in last 6 months? No  LIVING ENVIRONMENT: Lives with: lives alone Lives in: House/apartment Stairs: has a ramp into the home, no stairs in the home Has following equipment at home: Single point cane and Shower bench  OCCUPATION: retired  PLOF: Independent walks daily with a SPC 15 minutes, was shopping and cleaning house, did some plant care  PATIENT GOALS: have less pain  NEXT MD VISIT: none  OBJECTIVE:  Note: Objective measures were completed at Evaluation unless otherwise noted.  DIAGNOSTIC FINDINGS:  No kidney stones  PATIENT SURVEYS:  ODI: 60%  COGNITION: Overall cognitive status: Within functional limits for tasks assessed     SENSATION: WFL  MUSCLE LENGTH: Mild tightness in the HS, calves and piriformis POSTURE: rounded shoulders, forward head, and decreased lumbar lordosis  PALPATION: She is very tight and tender in the low back and into the flank bilaterally  LUMBAR ROM:   AROM eval 03/22/23  Flexion Decreased 50% Limited 25%  Extension 50% Limited 25%  Right lateral flexion 50% WFL  Left lateral flexion 50% WFL  Right rotation 50% WFL  Left rotation     (Blank rows = not tested)  LOWER EXTREMITY MMT   all increase pain  Active  Right eval Left eval  Hip flexion 4- 4-  Hip extension    Hip abduction    Hip adduction    Hip internal rotation    Hip external rotation    Knee flexion 4 4  Knee extension 4- 4-  Ankle dorsiflexion     Ankle plantarflexion    Ankle inversion    Ankle eversion     (Blank rows = not tested)  LOWER EXTREMITY ROM:  WNL    LUMBAR SPECIAL TESTS:  Straight leg raise test: Positive and Slump test: Positive  FUNCTIONAL TESTS:  5 times sit to stand: 25 seconds Timed up and go (TUG): 22 seconds with a SPC 3 minute walk test: not tested  GAIT: Distance walked: 50 feet Assistive device utilized: Single point cane Level of assistance: Modified independence Comments: slow antalgic on the right, mild unsteadiness with turning  TREATMENT DATE:  03/21/24 NuStep L5 x 6 min Bilat shoulder Flex 3lb WaTE x10 Rows & Lats 15lb 2x10 Shoulder Ext green 2x10 Sit to stand w/ 3lb chest press x10, x5  03/14/24 Bike L3 x 3 min Bilat shoulder Flex 3lb WaTE x10 Seated Rows red 2x10 Shoulder Ext red 2x10 Green tband clamshells Feet on ball K2C, rotation, bridge, isometric abs Passive stretching to HS,Piriformis, ITB, and K2C  STM to R flank  03/12/24 Nustep level 5 x 6 minutes Red tband rows Red tband extension Green tband clamshells Ball b/n knees squeeze Feet on ball K2C, rotation, bridge, isometric abs Left side lying STM to the right flank lumbar area  03/07/24 Left sidelying STM to the right lumbar area and into the right flank Some gentle passive stretch of the trunk and LE's Feet on ball K2C, rotation, small bridge, isometric abs Green tband clamshells Ball b/n knees squeeze Red tband row Red tband extension cues for posture an core    PATIENT EDUCATION:  Education details: HEP Person educated: Patient Education method: Programmer, multimedia, Facilities manager, Verbal cues, and Handouts Education comprehension: verbalized understanding  HOME EXERCISE PROGRAM: Access Code: [email or web address] URL: [email or web address] Date: 03/05/2024 Prepared by: Amy Speaks  Exercises - Seated Shoulder Shrugs  - 1 x daily - 7 x weekly - 2 sets - 10 reps - 3  hold - Seated Scapular Retraction  - 1 x  daily - 7 x weekly - 2 sets - 10 reps - 3 hold - Supine Posterior Pelvic Tilt  - 1 x daily - 7 x weekly - 2 sets - 10 reps - 3 hold - Hooklying Single Knee to Chest Stretch  - 1 x daily - 7 x weekly - 2 sets - 10 reps - 5 hold - Supine Lower Trunk Rotation  - 1 x daily - 7 x weekly - 2 sets - 10 reps - 5 hold - Standing Sidebends  - 1 x daily - 7 x weekly - 3 sets - 10 reps - Sidelying Open Book Thoracic Lumbar Rotation and Extension  - 1 x daily - 7 x weekly - 3 sets - 10 reps Access Code: [email or web address] URL: [email or web address] Date: 02/09/2024 Prepared by: Ozell Mainland  Exercises - Seated Shoulder Shrugs  - 1 x daily - 7 x weekly - 2 sets - 10 reps - 3 hold - Seated Scapular Retraction  - 1 x daily - 7 x weekly - 2 sets - 10 reps - 3 hold - Supine Posterior Pelvic Tilt  - 1 x daily - 7 x weekly - 2 sets - 10 reps - 3 hold - Hooklying Single Knee to Chest Stretch  - 1 x daily - 7 x weekly - 2 sets - 10 reps - 5 hold - Supine Lower Trunk Rotation  - 1 x daily - 7 x weekly - 2 sets - 10 reps - 5 hold  ASSESSMENT:  CLINICAL IMPRESSION: Patient is a 89 y.o. female who participated today in physical therapy treatment for low back and right flank pain. She has progressed towards long term goals with report improvement with decrease pain and better function. No issue with a progression to machine level interventions.   Cues for core engagement needed with shoulder Ext. She should continue to benefit from skilled PT to address.  OBJECTIVE IMPAIRMENTS: Abnormal gait, cardiopulmonary status limiting activity, decreased activity tolerance, decreased balance, decreased endurance, decreased mobility, difficulty walking, decreased ROM, decreased strength, increased fascial restrictions, increased muscle spasms, impaired flexibility, improper body mechanics, postural dysfunction, and pain.   ACTIVITY LIMITATIONS: carrying, lifting, bending, sitting, standing, sleeping, stairs, and  locomotion level  PARTICIPATION LIMITATIONS: cleaning, shopping, community activity, and yard work  PERSONAL FACTORS: Age, Fitness, and Time since onset of injury/illness/exacerbation are also affecting patient's functional outcome.   REHAB POTENTIAL: Good  CLINICAL DECISION MAKING: Evolving/moderate complexity  EVALUATION COMPLEXITY: Moderate   GOALS: Goals reviewed with patient? Yes  SHORT TERM GOALS: Target date: 03/04/24  Independent with initial HEP Baseline: Goal status: met 02/29/24   LONG TERM GOALS: Target date: 05/11/24  Independent with advanced HEP Baseline:  Goal status: ongoing 03/12/24  2.  Understand proper posture and body mechanics Baseline:  Goal status: ongoing 02/29/24  3.  Decrease pain 50% Baseline:  Goal status: progressing 03/07/24, Met 75% 03/21/24  4.  Increase lumbar ROM 25% Baseline:  Goal status: met 02/29/24  5.  Report able to clean her house Baseline: currently is having someone clean Goal status: ongoing 03/12/24, Ongoing 03/21/24  6.  Able to shop for groceries Baseline: family members are currently doing this Goal status: ongoing 03/21/24  PLAN:  PT FREQUENCY: 2x/week  PT DURATION: 12 weeks  PLANNED INTERVENTIONS: 97164- PT Re-evaluation, 97110-Therapeutic exercises, 97530- Therapeutic activity, 97112- Neuromuscular re-education, 97535- Self Care, 02859- Manual therapy, [id number]- Gait training, [id number]- Electrical stimulation (unattended), [id number]- Ultrasound, 02987- Traction (  mechanical), 02966- Ionotophoresis 4mg /ml Dexamethasone, Patient/Family education, Balance training, Stair training, Taping, Joint mobilization, Cryotherapy, and Moist heat.  PLAN FOR NEXT SESSION: see how she is doing , advance exercises as needed.    Tanda KANDICE Sorrow, PTA,  03/21/2024, 1:45 PM

## 2024-03-26 ENCOUNTER — Other Ambulatory Visit: Payer: Self-pay

## 2024-03-26 ENCOUNTER — Ambulatory Visit

## 2024-03-26 DIAGNOSIS — M62830 Muscle spasm of back: Secondary | ICD-10-CM

## 2024-03-26 DIAGNOSIS — M6281 Muscle weakness (generalized): Secondary | ICD-10-CM | POA: Diagnosis not present

## 2024-03-26 DIAGNOSIS — M5459 Other low back pain: Secondary | ICD-10-CM

## 2024-03-26 DIAGNOSIS — R2689 Other abnormalities of gait and mobility: Secondary | ICD-10-CM | POA: Diagnosis not present

## 2024-03-26 NOTE — Therapy (Signed)
 OUTPATIENT PHYSICAL THERAPY THORACOLUMBAR TREATMENT    Patient Name: Sheila Hanson MRN: [id number] DOB:[birth year], 89 y.o., female Today's Date: 03/26/2024  END OF SESSION:  PT End of Session - 03/26/24 1301     Visit Number 11    Date for PT Re-Evaluation 05/11/24    Authorization Type Medicare    Progress Note Due on Visit 20    PT Start Time 1301    PT Stop Time 1345    PT Time Calculation (min) 44 min    Activity Tolerance Patient tolerated treatment well    Behavior During Therapy [facility] for tasks assessed/performed           Past Medical History:  Diagnosis Date   Cirrhosis (HCC)    Colon adenoma 08/16/2005   Coronary artery disease    Diabetes mellitus without complication (HCC)    GERD (gastroesophageal reflux disease)    Humeral head fracture    Hypercholesteremia    Hypertension    Osteopenia    Positive PPD    Varicose veins    Wrist fracture    No past surgical history on file. Patient Active Problem List   Diagnosis Date Noted   Pain due to onychomycosis of toenails of both feet 01/06/2022   Type 2 diabetes mellitus with vascular disease (HCC) 01/06/2022    PCP: Charlott, MD  REFERRING PROVIDER: [place], PA  REFERRING DIAG: right back and flank pain  Rationale for Evaluation and Treatment: Rehabilitation  THERAPY DIAG:  No diagnosis found.  ONSET DATE: 01/16/24  SUBJECTIVE:                                                                                                                                                                                           SUBJECTIVE STATEMENT: 03/26/24: I'm better over all , feel like things are breaking loose   Patient is a 89 y.o. female who was seen today for physical therapy evaluation and treatment for low back and right flank pain.  She reports that she did care for her husband who passed away in July 30, 2024.  She reports that she wore her self out with keeping him at home.  She does have some liver  issues.  She does recall lifting about 45# and really caused a pulling  PERTINENT HISTORY:  See above  PAIN:  Are you having pain? Yes: NPRS scale: 2/10 Pain location: back  Pain description: Discomfort Aggravating factors: standing, bending, shopping pain will go up to 9-10/10 Relieving factors: warm shower at best pain a 3-4/10  PRECAUTIONS: None  RED FLAGS: None   WEIGHT BEARING RESTRICTIONS: No  FALLS:  Has  patient fallen in last 6 months? No  LIVING ENVIRONMENT: Lives with: lives alone Lives in: House/apartment Stairs: has a ramp into the home, no stairs in the home Has following equipment at home: Single point cane and Shower bench  OCCUPATION: retired  PLOF: Independent walks daily with a SPC 15 minutes, was shopping and cleaning house, did some plant care  PATIENT GOALS: have less pain  NEXT MD VISIT: none  OBJECTIVE:  Note: Objective measures were completed at Evaluation unless otherwise noted.  DIAGNOSTIC FINDINGS:  No kidney stones  PATIENT SURVEYS:  ODI: 60%  COGNITION: Overall cognitive status: Within functional limits for tasks assessed     SENSATION: WFL  MUSCLE LENGTH: Mild tightness in the HS, calves and piriformis POSTURE: rounded shoulders, forward head, and decreased lumbar lordosis  PALPATION: She is very tight and tender in the low back and into the flank bilaterally  LUMBAR ROM:   AROM eval 03/22/23  Flexion Decreased 50% Limited 25%  Extension 50% Limited 25%  Right lateral flexion 50% WFL  Left lateral flexion 50% WFL  Right rotation 50% WFL  Left rotation     (Blank rows = not tested)  LOWER EXTREMITY MMT   all increase pain  Active  Right eval Left eval  Hip flexion 4- 4-  Hip extension    Hip abduction    Hip adduction    Hip internal rotation    Hip external rotation    Knee flexion 4 4  Knee extension 4- 4-  Ankle dorsiflexion    Ankle plantarflexion    Ankle inversion    Ankle eversion     (Blank rows =  not tested)  LOWER EXTREMITY ROM:  WNL    LUMBAR SPECIAL TESTS:  Straight leg raise test: Positive and Slump test: Positive  FUNCTIONAL TESTS:  5 times sit to stand: 25 seconds Timed up and go (TUG): 22 seconds with a SPC 3 minute walk test: not tested  GAIT: Distance walked: 50 feet Assistive device utilized: Single point cane Level of assistance: Modified independence Comments: slow antalgic on the right, mild unsteadiness with turning  TREATMENT DATE:  03/26/24:  Nustep L5 x 6 min Ue's and Les Bilat shoulder Flex 3lb WaTE x10 Supine for 65 cm ball LTR, K2C Rows & Lats 15lb 2x10 Shoulder Ext green 2x10 in standing Wall slides into R shoulder abduction with R pelvic shift to stretch R Q L.   Side lying L for deep cross friction massage R QL, R lumbar parspinals.   03/21/24 NuStep L5 x 6 min Bilat shoulder Flex 3lb WaTE x10 Rows & Lats 15lb 2x10 Shoulder Ext green 2x10 Sit to stand w/ 3lb chest press x10, x5  03/14/24 Bike L3 x 3 min Bilat shoulder Flex 3lb WaTE x10 Seated Rows red 2x10 Shoulder Ext red 2x10 Green tband clamshells Feet on ball K2C, rotation, bridge, isometric abs Passive stretching to HS,Piriformis, ITB, and K2C  STM to R flank  03/12/24 Nustep level 5 x 6 minutes Red tband rows Red tband extension Green tband clamshells Ball b/n knees squeeze Feet on ball K2C, rotation, bridge, isometric abs Left side lying STM to the right flank lumbar area  03/07/24 Left sidelying STM to the right lumbar area and into the right flank Some gentle passive stretch of the trunk and LE's Feet on ball K2C, rotation, small bridge, isometric abs Green tband clamshells Ball b/n knees squeeze Red tband row Red tband extension cues for posture an core  PATIENT EDUCATION:  Education details: HEP Person educated: Patient Education method: Programmer, multimedia, Facilities manager, Verbal cues, and Handouts Education comprehension: verbalized understanding  HOME EXERCISE  PROGRAM: Access Code: [email or web address] URL: [email or web address] Date: 03/05/2024 Prepared by: Kalaya Infantino  Exercises - Seated Shoulder Shrugs  - 1 x daily - 7 x weekly - 2 sets - 10 reps - 3 hold - Seated Scapular Retraction  - 1 x daily - 7 x weekly - 2 sets - 10 reps - 3 hold - Supine Posterior Pelvic Tilt  - 1 x daily - 7 x weekly - 2 sets - 10 reps - 3 hold - Hooklying Single Knee to Chest Stretch  - 1 x daily - 7 x weekly - 2 sets - 10 reps - 5 hold - Supine Lower Trunk Rotation  - 1 x daily - 7 x weekly - 2 sets - 10 reps - 5 hold - Standing Sidebends  - 1 x daily - 7 x weekly - 3 sets - 10 reps - Sidelying Open Book Thoracic Lumbar Rotation and Extension  - 1 x daily - 7 x weekly - 3 sets - 10 reps Access Code: [email or web address] URL: [email or web address] Date: 02/09/2024 Prepared by: Ozell Mainland  Exercises - Seated Shoulder Shrugs  - 1 x daily - 7 x weekly - 2 sets - 10 reps - 3 hold - Seated Scapular Retraction  - 1 x daily - 7 x weekly - 2 sets - 10 reps - 3 hold - Supine Posterior Pelvic Tilt  - 1 x daily - 7 x weekly - 2 sets - 10 reps - 3 hold - Hooklying Single Knee to Chest Stretch  - 1 x daily - 7 x weekly - 2 sets - 10 reps - 5 hold - Supine Lower Trunk Rotation  - 1 x daily - 7 x weekly - 2 sets - 10 reps - 5 hold  ASSESSMENT:  CLINICAL IMPRESSION: Patient is a 89 y.o. female who participated today in physical therapy treatment for low back and right flank pain. She has progressed towards long term goals with report improvement with decrease pain and better function. She is experiencing less episodes of R  flank pain, reports primarily occurs at night, continued with combination of manual techniques and core strength.. She should continue to benefit from skilled PT to address.  OBJECTIVE IMPAIRMENTS: Abnormal gait, cardiopulmonary status limiting activity, decreased activity tolerance, decreased balance, decreased endurance, decreased mobility,  difficulty walking, decreased ROM, decreased strength, increased fascial restrictions, increased muscle spasms, impaired flexibility, improper body mechanics, postural dysfunction, and pain.   ACTIVITY LIMITATIONS: carrying, lifting, bending, sitting, standing, sleeping, stairs, and locomotion level  PARTICIPATION LIMITATIONS: cleaning, shopping, community activity, and yard work  PERSONAL FACTORS: Age, Fitness, and Time since onset of injury/illness/exacerbation are also affecting patient's functional outcome.   REHAB POTENTIAL: Good  CLINICAL DECISION MAKING: Evolving/moderate complexity  EVALUATION COMPLEXITY: Moderate   GOALS: Goals reviewed with patient? Yes  SHORT TERM GOALS: Target date: 03/04/24  Independent with initial HEP Baseline: Goal status: met 02/29/24   LONG TERM GOALS: Target date: 05/11/24  Independent with advanced HEP Baseline:  Goal status: ongoing 03/12/24  2.  Understand proper posture and body mechanics Baseline:  Goal status: ongoing 02/29/24  3.  Decrease pain 50% Baseline:  Goal status: progressing 03/07/24, Met 75% 03/21/24  4.  Increase lumbar ROM 25% Baseline:  Goal status: met 02/29/24  5.  Report able to clean her house Baseline: currently is having someone clean Goal status: ongoing  03/12/24, Ongoing 03/21/24  6.  Able to shop for groceries Baseline: family members are currently doing this Goal status: ongoing 03/21/24  PLAN:  PT FREQUENCY: 2x/week  PT DURATION: 12 weeks  PLANNED INTERVENTIONS: 97164- PT Re-evaluation, 97110-Therapeutic exercises, 97530- Therapeutic activity, 97112- Neuromuscular re-education, 97535- Self Care, 02859- Manual therapy, [id number]- Gait training, [phone]- Electrical stimulation (unattended), [id number]- Ultrasound, 02987- Traction (mechanical), [id number]- Ionotophoresis 4mg /ml Dexamethasone, Patient/Family education, Balance training, Stair training, Taping, Joint mobilization, Cryotherapy, and Moist heat.  PLAN FOR NEXT  SESSION: see how she is doing , advance exercises as needed.    Karsten Vaughn L Bellamarie Pflug, PT, DPT, OCS 03/26/2024, 6:05 PM

## 2024-03-28 ENCOUNTER — Ambulatory Visit

## 2024-04-02 ENCOUNTER — Ambulatory Visit: Admitting: Physical Therapy

## 2024-04-02 ENCOUNTER — Encounter: Payer: Self-pay | Admitting: Physical Therapy

## 2024-04-02 DIAGNOSIS — M6281 Muscle weakness (generalized): Secondary | ICD-10-CM

## 2024-04-02 DIAGNOSIS — R2689 Other abnormalities of gait and mobility: Secondary | ICD-10-CM

## 2024-04-02 DIAGNOSIS — M5459 Other low back pain: Secondary | ICD-10-CM

## 2024-04-02 DIAGNOSIS — M62830 Muscle spasm of back: Secondary | ICD-10-CM

## 2024-04-02 NOTE — Therapy (Signed)
 OUTPATIENT PHYSICAL THERAPY THORACOLUMBAR TREATMENT    Patient Name: EMONY DORMER MRN: [id number] DOB:1934/04/08, 89 y.o., female Today's Date: 04/02/2024  END OF SESSION:  PT End of Session - 04/02/24 1324     Visit Number 12    Date for PT Re-Evaluation 05/11/24    Authorization Type Medicare    PT Start Time 1315    PT Stop Time 1358    PT Time Calculation (min) 43 min    Activity Tolerance Patient tolerated treatment well    Behavior During Therapy [facility] for tasks assessed/performed           Past Medical History:  Diagnosis Date   Cirrhosis (HCC)    Colon adenoma 08/16/2005   Coronary artery disease    Diabetes mellitus without complication (HCC)    GERD (gastroesophageal reflux disease)    Humeral head fracture    Hypercholesteremia    Hypertension    Osteopenia    Positive PPD    Varicose veins    Wrist fracture    History reviewed. No pertinent surgical history. Patient Active Problem List   Diagnosis Date Noted   Pain due to onychomycosis of toenails of both feet 01/06/2022   Type 2 diabetes mellitus with vascular disease (HCC) 01/06/2022    PCP: Charlott, MD  REFERRING PROVIDER: [place], PA  REFERRING DIAG: right back and flank pain  Rationale for Evaluation and Treatment: Rehabilitation  THERAPY DIAG:  Other low back pain  Muscle weakness (generalized)  Muscle spasm of back  Other abnormalities of gait and mobility  ONSET DATE: 01/16/24  SUBJECTIVE:                                                                                                                                                                                           SUBJECTIVE STATEMENT: 04/02/24:Doing better, still some balance issues, near fall over the weekend   Patient is a 89 y.o. female who was seen today for physical therapy evaluation and treatment for low back and right flank pain.  She reports that she did care for her husband who passed away in 08/09/24.  She  reports that she wore her self out with keeping him at home.  She does have some liver issues.  She does recall lifting about 45# and really caused a pulling  PERTINENT HISTORY:  See above  PAIN:  Are you having pain? Yes: NPRS scale: 2/10 Pain location: back  Pain description: Discomfort Aggravating factors: standing, bending, shopping pain will go up to 9-10/10 Relieving factors: warm shower at best pain a 3-4/10  PRECAUTIONS: None  RED FLAGS: None   WEIGHT  BEARING RESTRICTIONS: No  FALLS:  Has patient fallen in last 6 months? No  LIVING ENVIRONMENT: Lives with: lives alone Lives in: House/apartment Stairs: has a ramp into the home, no stairs in the home Has following equipment at home: Single point cane and Shower bench  OCCUPATION: retired  PLOF: Independent walks daily with a SPC 15 minutes, was shopping and cleaning house, did some plant care  PATIENT GOALS: have less pain  NEXT MD VISIT: none  OBJECTIVE:  Note: Objective measures were completed at Evaluation unless otherwise noted.  DIAGNOSTIC FINDINGS:  No kidney stones  PATIENT SURVEYS:  ODI: 60%  COGNITION: Overall cognitive status: Within functional limits for tasks assessed     SENSATION: WFL  MUSCLE LENGTH: Mild tightness in the HS, calves and piriformis POSTURE: rounded shoulders, forward head, and decreased lumbar lordosis  PALPATION: She is very tight and tender in the low back and into the flank bilaterally  LUMBAR ROM:   AROM eval 03/22/23  Flexion Decreased 50% Limited 25%  Extension 50% Limited 25%  Right lateral flexion 50% WFL  Left lateral flexion 50% WFL  Right rotation 50% WFL  Left rotation     (Blank rows = not tested)  LOWER EXTREMITY MMT   all increase pain  Active  Right eval Left eval  Hip flexion 4- 4-  Hip extension    Hip abduction    Hip adduction    Hip internal rotation    Hip external rotation    Knee flexion 4 4  Knee extension 4- 4-  Ankle  dorsiflexion    Ankle plantarflexion    Ankle inversion    Ankle eversion     (Blank rows = not tested)  LOWER EXTREMITY ROM:  WNL    LUMBAR SPECIAL TESTS:  Straight leg raise test: Positive and Slump test: Positive  FUNCTIONAL TESTS:  5 times sit to stand: 25 seconds Timed up and go (TUG): 22 seconds with a SPC 3 minute walk test: not tested  GAIT: Distance walked: 50 feet Assistive device utilized: Single point cane Level of assistance: Modified independence Comments: slow antalgic on the right, mild unsteadiness with turning  TREATMENT DATE:  04/02/24 Nustep level 5 x 6 minutes Gait outside down slope, up slope some uneven terrain walking negotiating curbs,  very fatigued, SOB and veered to the right multiple times, some CGA for curbs On airex ball toss On airex reaching On airex cone toe touches Walking ball toss Direction changes 20# HS curls  03/26/24:  Nustep L5 x 6 min Ue's and Les Bilat shoulder Flex 3lb WaTE x10 Supine for 65 cm ball LTR, K2C Rows & Lats 15lb 2x10 Shoulder Ext green 2x10 in standing Wall slides into R shoulder abduction with R pelvic shift to stretch R Q L.   Side lying L for deep cross friction massage R QL, R lumbar parspinals.   03/21/24 NuStep L5 x 6 min Bilat shoulder Flex 3lb WaTE x10 Rows & Lats 15lb 2x10 Shoulder Ext green 2x10 Sit to stand w/ 3lb chest press x10, x5  03/14/24 Bike L3 x 3 min Bilat shoulder Flex 3lb WaTE x10 Seated Rows red 2x10 Shoulder Ext red 2x10 Green tband clamshells Feet on ball K2C, rotation, bridge, isometric abs Passive stretching to HS,Piriformis, ITB, and K2C  STM to R flank  03/12/24 Nustep level 5 x 6 minutes Red tband rows Red tband extension Green tband clamshells Ball b/n knees squeeze Feet on ball K2C, rotation, bridge, isometric abs Left side  lying STM to the right flank lumbar area  03/07/24 Left sidelying STM to the right lumbar area and into the right flank Some gentle passive  stretch of the trunk and LE's Feet on ball K2C, rotation, small bridge, isometric abs Green tband clamshells Ball b/n knees squeeze Red tband row Red tband extension cues for posture an core    PATIENT EDUCATION:  Education details: HEP Person educated: Patient Education method: Programmer, multimedia, Facilities manager, Verbal cues, and Handouts Education comprehension: verbalized understanding  HOME EXERCISE PROGRAM: Access Code: [email or web address] URL: [email or web address] Date: 03/05/2024 Prepared by: Amy Speaks  Exercises - Seated Shoulder Shrugs  - 1 x daily - 7 x weekly - 2 sets - 10 reps - 3 hold - Seated Scapular Retraction  - 1 x daily - 7 x weekly - 2 sets - 10 reps - 3 hold - Supine Posterior Pelvic Tilt  - 1 x daily - 7 x weekly - 2 sets - 10 reps - 3 hold - Hooklying Single Knee to Chest Stretch  - 1 x daily - 7 x weekly - 2 sets - 10 reps - 5 hold - Supine Lower Trunk Rotation  - 1 x daily - 7 x weekly - 2 sets - 10 reps - 5 hold - Standing Sidebends  - 1 x daily - 7 x weekly - 3 sets - 10 reps - Sidelying Open Book Thoracic Lumbar Rotation and Extension  - 1 x daily - 7 x weekly - 3 sets - 10 reps Access Code: [email or web address] URL: [email or web address] Date: 02/09/2024 Prepared by: Ozell Mainland  Exercises - Seated Shoulder Shrugs  - 1 x daily - 7 x weekly - 2 sets - 10 reps - 3 hold - Seated Scapular Retraction  - 1 x daily - 7 x weekly - 2 sets - 10 reps - 3 hold - Supine Posterior Pelvic Tilt  - 1 x daily - 7 x weekly - 2 sets - 10 reps - 3 hold - Hooklying Single Knee to Chest Stretch  - 1 x daily - 7 x weekly - 2 sets - 10 reps - 5 hold - Supine Lower Trunk Rotation  - 1 x daily - 7 x weekly - 2 sets - 10 reps - 5 hold  ASSESSMENT:  CLINICAL IMPRESSION: Patient is a 89 y.o. female who participated today in physical therapy treatment for low back and right flank pain. She has progressed towards long term goals with report improvement with decrease pain  and better function. She really struggled with balance especially on dynamic surfaces requiring CGA, a few LOB where she needed mon A. She should continue to benefit from skilled PT to address.  OBJECTIVE IMPAIRMENTS: Abnormal gait, cardiopulmonary status limiting activity, decreased activity tolerance, decreased balance, decreased endurance, decreased mobility, difficulty walking, decreased ROM, decreased strength, increased fascial restrictions, increased muscle spasms, impaired flexibility, improper body mechanics, postural dysfunction, and pain.   ACTIVITY LIMITATIONS: carrying, lifting, bending, sitting, standing, sleeping, stairs, and locomotion level  PARTICIPATION LIMITATIONS: cleaning, shopping, community activity, and yard work  PERSONAL FACTORS: Age, Fitness, and Time since onset of injury/illness/exacerbation are also affecting patient's functional outcome.   REHAB POTENTIAL: Good  CLINICAL DECISION MAKING: Evolving/moderate complexity  EVALUATION COMPLEXITY: Moderate   GOALS: Goals reviewed with patient? Yes  SHORT TERM GOALS: Target date: 03/04/24  Independent with initial HEP Baseline: Goal status: met 02/29/24   LONG TERM GOALS: Target date: 05/11/24  Independent with advanced HEP Baseline:  Goal status: ongoing 03/12/24  2.  Understand  proper posture and body mechanics Baseline:  Goal status: ongoing 02/29/24  3.  Decrease pain 50% Baseline:  Goal status: progressing 03/07/24, Met 75% 03/21/24  4.  Increase lumbar ROM 25% Baseline:  Goal status: met 02/29/24  5.  Report able to clean her house Baseline: currently is having someone clean Goal status: ongoing 03/12/24, Ongoing 03/21/24  6.  Able to shop for groceries Baseline: family members are currently doing this Goal status: ongoing 03/21/24  PLAN:  PT FREQUENCY: 2x/week  PT DURATION: 12 weeks  PLANNED INTERVENTIONS: 97164- PT Re-evaluation, 97110-Therapeutic exercises, 97530- Therapeutic activity,  97112- Neuromuscular re-education, 97535- Self Care, 02859- Manual therapy, [id number]- Gait training, [phone]- Electrical stimulation (unattended), [id number]- Ultrasound, 02987- Traction (mechanical), [id number]- Ionotophoresis 4mg /ml Dexamethasone, Patient/Family education, Balance training, Stair training, Taping, Joint mobilization, Cryotherapy, and Moist heat.  PLAN FOR NEXT SESSION: see how she is doing , advance exercises as needed.    OBADIAH OZELL ORN, PT, 04/02/2024, 1:24 PM

## 2024-04-04 ENCOUNTER — Other Ambulatory Visit: Payer: Self-pay

## 2024-04-04 ENCOUNTER — Ambulatory Visit

## 2024-04-04 DIAGNOSIS — M5459 Other low back pain: Secondary | ICD-10-CM | POA: Diagnosis not present

## 2024-04-04 DIAGNOSIS — M6281 Muscle weakness (generalized): Secondary | ICD-10-CM | POA: Diagnosis not present

## 2024-04-04 DIAGNOSIS — M62830 Muscle spasm of back: Secondary | ICD-10-CM

## 2024-04-04 DIAGNOSIS — R2689 Other abnormalities of gait and mobility: Secondary | ICD-10-CM

## 2024-04-04 NOTE — Therapy (Signed)
 OUTPATIENT PHYSICAL THERAPY THORACOLUMBAR TREATMENT    Patient Name: Sheila Hanson MRN: [id number] DOB:1934-05-26, 89 y.o., female Today's Date: 04/04/2024  END OF SESSION:  PT End of Session - 04/04/24 1304     Visit Number 13    Date for PT Re-Evaluation 05/11/24    Authorization Type Medicare    Progress Note Due on Visit 20    PT Start Time 1304    PT Stop Time 1342    PT Time Calculation (min) 38 min    Activity Tolerance Patient tolerated treatment well    Behavior During Therapy [facility] for tasks assessed/performed            Past Medical History:  Diagnosis Date   Cirrhosis (HCC)    Colon adenoma 08/16/2005   Coronary artery disease    Diabetes mellitus without complication (HCC)    GERD (gastroesophageal reflux disease)    Humeral head fracture    Hypercholesteremia    Hypertension    Osteopenia    Positive PPD    Varicose veins    Wrist fracture    History reviewed. No pertinent surgical history. Patient Active Problem List   Diagnosis Date Noted   Pain due to onychomycosis of toenails of both feet 01/06/2022   Type 2 diabetes mellitus with vascular disease (HCC) 01/06/2022    PCP: Charlott, MD  REFERRING PROVIDER: [place], PA  REFERRING DIAG: right back and flank pain  Rationale for Evaluation and Treatment: Rehabilitation  THERAPY DIAG:  Other low back pain  Muscle weakness (generalized)  Muscle spasm of back  Other abnormalities of gait and mobility  ONSET DATE: 01/16/24  SUBJECTIVE:                                                                                                                                                                                           SUBJECTIVE STATEMENT: 04/04/24:my R flank hurting again today.  I am really fatigued, I think due to my platelet count being low   Patient is a 89 y.o. female who was seen today for physical therapy evaluation and treatment for low back and right flank pain.  She reports  that she did care for her husband who passed away in 2024/07/19.  She reports that she wore her self out with keeping him at home.  She does have some liver issues.  She does recall lifting about 45# and really caused a pulling  PERTINENT HISTORY:  See above  PAIN:  Are you having pain? Yes: NPRS scale: 2/10 Pain location: back  Pain description: Discomfort Aggravating factors: standing, bending, shopping pain will go up to 9-10/10  Relieving factors: warm shower at best pain a 3-4/10  PRECAUTIONS: None  RED FLAGS: None   WEIGHT BEARING RESTRICTIONS: No  FALLS:  Has patient fallen in last 6 months? No  LIVING ENVIRONMENT: Lives with: lives alone Lives in: House/apartment Stairs: has a ramp into the home, no stairs in the home Has following equipment at home: Single point cane and Shower bench  OCCUPATION: retired  PLOF: Independent walks daily with a SPC 15 minutes, was shopping and cleaning house, did some plant care  PATIENT GOALS: have less pain  NEXT MD VISIT: none  OBJECTIVE:  Note: Objective measures were completed at Evaluation unless otherwise noted.  DIAGNOSTIC FINDINGS:  No kidney stones  PATIENT SURVEYS:  ODI: 60%  COGNITION: Overall cognitive status: Within functional limits for tasks assessed     SENSATION: WFL  MUSCLE LENGTH: Mild tightness in the HS, calves and piriformis POSTURE: rounded shoulders, forward head, and decreased lumbar lordosis  PALPATION: She is very tight and tender in the low back and into the flank bilaterally  LUMBAR ROM:   AROM eval 03/22/23  Flexion Decreased 50% Limited 25%  Extension 50% Limited 25%  Right lateral flexion 50% WFL  Left lateral flexion 50% WFL  Right rotation 50% WFL  Left rotation     (Blank rows = not tested)  LOWER EXTREMITY MMT   all increase pain  Active  Right eval Left eval  Hip flexion 4- 4-  Hip extension    Hip abduction    Hip adduction    Hip internal rotation    Hip external  rotation    Knee flexion 4 4  Knee extension 4- 4-  Ankle dorsiflexion    Ankle plantarflexion    Ankle inversion    Ankle eversion     (Blank rows = not tested)  LOWER EXTREMITY ROM:  WNL    LUMBAR SPECIAL TESTS:  Straight leg raise test: Positive and Slump test: Positive  FUNCTIONAL TESTS:  5 times sit to stand: 25 seconds Timed up and go (TUG): 22 seconds with a SPC 3 minute walk test: not tested  GAIT: Distance walked: 50 feet Assistive device utilized: Single point cane Level of assistance: Modified independence Comments: slow antalgic on the right, mild unsteadiness with turning  TREATMENT DATE:  04/04/24:  Nustep Level 5 x 6 min Side lying on L for deep cross friciton massage R quadratus lumborum Therapist provided longitudinal distraction R lateral lumbar, pelvis, with pt in L side lying with R hip and knee flexed over L Supine for lat pull overs with 3# cuff wt gripped B hands Supine with humerus vertical with post capsule stretch, with LTR opposite side to stretch posterior chain / trunk, 10 reps each side Seated rows 15# 15x Sit to stand with forward punches with 3# cuff wt from hi low mat 10x Seated forward, side to side stretches with B hands on large physioball  04/02/24 Nustep level 5 x 6 minutes Gait outside down slope, up slope some uneven terrain walking negotiating curbs,  very fatigued, SOB and veered to the right multiple times, some CGA for curbs On airex ball toss On airex reaching On airex cone toe touches Walking ball toss Direction changes 20# HS curls  03/26/24:  Nustep L5 x 6 min Ue's and Les Bilat shoulder Flex 3lb WaTE x10 Supine for 65 cm ball LTR, K2C Rows & Lats 15lb 2x10 Shoulder Ext green 2x10 in standing Wall slides into R shoulder abduction with R pelvic shift  to stretch R Q L.   Side lying L for deep cross friction massage R QL, R lumbar parspinals.   03/21/24 NuStep L5 x 6 min Bilat shoulder Flex 3lb WaTE x10 Rows & Lats  15lb 2x10 Shoulder Ext green 2x10 Sit to stand w/ 3lb chest press x10, x5  03/14/24 Bike L3 x 3 min Bilat shoulder Flex 3lb WaTE x10 Seated Rows red 2x10 Shoulder Ext red 2x10 Green tband clamshells Feet on ball K2C, rotation, bridge, isometric abs Passive stretching to HS,Piriformis, ITB, and K2C  STM to R flank  03/12/24 Nustep level 5 x 6 minutes Red tband rows Red tband extension Green tband clamshells Ball b/n knees squeeze Feet on ball K2C, rotation, bridge, isometric abs Left side lying STM to the right flank lumbar area  03/07/24 Left sidelying STM to the right lumbar area and into the right flank Some gentle passive stretch of the trunk and LE's Feet on ball K2C, rotation, small bridge, isometric abs Green tband clamshells Ball b/n knees squeeze Red tband row Red tband extension cues for posture an core    PATIENT EDUCATION:  Education details: HEP Person educated: Patient Education method: Programmer, multimedia, Facilities manager, Verbal cues, and Handouts Education comprehension: verbalized understanding  HOME EXERCISE PROGRAM: Access Code: [email or web address] URL: [email or web address] Date: 03/05/2024 Prepared by: Asta Corbridge  Exercises - Seated Shoulder Shrugs  - 1 x daily - 7 x weekly - 2 sets - 10 reps - 3 hold - Seated Scapular Retraction  - 1 x daily - 7 x weekly - 2 sets - 10 reps - 3 hold - Supine Posterior Pelvic Tilt  - 1 x daily - 7 x weekly - 2 sets - 10 reps - 3 hold - Hooklying Single Knee to Chest Stretch  - 1 x daily - 7 x weekly - 2 sets - 10 reps - 5 hold - Supine Lower Trunk Rotation  - 1 x daily - 7 x weekly - 2 sets - 10 reps - 5 hold - Standing Sidebends  - 1 x daily - 7 x weekly - 3 sets - 10 reps - Sidelying Open Book Thoracic Lumbar Rotation and Extension  - 1 x daily - 7 x weekly - 3 sets - 10 reps Access Code: [email or web address] URL: [email or web address] Date: 02/09/2024 Prepared by: Ozell Mainland  Exercises - Seated Shoulder  Shrugs  - 1 x daily - 7 x weekly - 2 sets - 10 reps - 3 hold - Seated Scapular Retraction  - 1 x daily - 7 x weekly - 2 sets - 10 reps - 3 hold - Supine Posterior Pelvic Tilt  - 1 x daily - 7 x weekly - 2 sets - 10 reps - 3 hold - Hooklying Single Knee to Chest Stretch  - 1 x daily - 7 x weekly - 2 sets - 10 reps - 5 hold - Supine Lower Trunk Rotation  - 1 x daily - 7 x weekly - 2 sets - 10 reps - 5 hold  ASSESSMENT:  CLINICAL IMPRESSION: Patient is a 89 y.o. female who participated today in physical therapy treatment for low back and right flank pain. Reports more flank pain this am,  tender at abdominal attachments R iliacc crest.  Treatments focused on improving mobility lumbar spine, thoracic spine, ribs.  Limited upright activity today as she reported more than typical fatigue.    She should continue to benefit from skilled PT to address her pain as well asl her overall stability.  OBJECTIVE IMPAIRMENTS:  Abnormal gait, cardiopulmonary status limiting activity, decreased activity tolerance, decreased balance, decreased endurance, decreased mobility, difficulty walking, decreased ROM, decreased strength, increased fascial restrictions, increased muscle spasms, impaired flexibility, improper body mechanics, postural dysfunction, and pain.   ACTIVITY LIMITATIONS: carrying, lifting, bending, sitting, standing, sleeping, stairs, and locomotion level  PARTICIPATION LIMITATIONS: cleaning, shopping, community activity, and yard work  PERSONAL FACTORS: Age, Fitness, and Time since onset of injury/illness/exacerbation are also affecting patient's functional outcome.   REHAB POTENTIAL: Good  CLINICAL DECISION MAKING: Evolving/moderate complexity  EVALUATION COMPLEXITY: Moderate   GOALS: Goals reviewed with patient? Yes  SHORT TERM GOALS: Target date: 03/04/24  Independent with initial HEP Baseline: Goal status: met 02/29/24   LONG TERM GOALS: Target date: 05/11/24  Independent with  advanced HEP Baseline:  Goal status: ongoing 03/12/24  2.  Understand proper posture and body mechanics Baseline:  Goal status: ongoing 02/29/24  3.  Decrease pain 50% Baseline:  Goal status: progressing 03/07/24, Met 75% 03/21/24  4.  Increase lumbar ROM 25% Baseline:  Goal status: met 02/29/24  5.  Report able to clean her house Baseline: currently is having someone clean Goal status: ongoing 03/12/24, Ongoing 03/21/24  6.  Able to shop for groceries Baseline: family members are currently doing this Goal status: ongoing 03/21/24 04/04/24:  ongoing   PLAN:  PT FREQUENCY: 2x/week  PT DURATION: 12 weeks  PLANNED INTERVENTIONS: 97164- PT Re-evaluation, 97110-Therapeutic exercises, 97530- Therapeutic activity, 97112- Neuromuscular re-education, 97535- Self Care, 02859- Manual therapy, [id number]- Gait training, [phone]- Electrical stimulation (unattended), [id number]- Ultrasound, 02987- Traction (mechanical), [id number]- Ionotophoresis 4mg /ml Dexamethasone, Patient/Family education, Balance training, Stair training, Taping, Joint mobilization, Cryotherapy, and Moist heat.  PLAN FOR NEXT SESSION: see how she is doing , advance exercises as needed.    Princes Finger L Hend Mccarrell, PT,DPT, OCS 04/04/2024, 5:15 PM

## 2024-04-05 DIAGNOSIS — H43813 Vitreous degeneration, bilateral: Secondary | ICD-10-CM | POA: Diagnosis not present

## 2024-04-05 DIAGNOSIS — H353221 Exudative age-related macular degeneration, left eye, with active choroidal neovascularization: Secondary | ICD-10-CM | POA: Diagnosis not present

## 2024-04-05 DIAGNOSIS — H353112 Nonexudative age-related macular degeneration, right eye, intermediate dry stage: Secondary | ICD-10-CM | POA: Diagnosis not present

## 2024-04-05 DIAGNOSIS — H35033 Hypertensive retinopathy, bilateral: Secondary | ICD-10-CM | POA: Diagnosis not present

## 2024-04-05 DIAGNOSIS — H43391 Other vitreous opacities, right eye: Secondary | ICD-10-CM | POA: Diagnosis not present

## 2024-04-11 ENCOUNTER — Other Ambulatory Visit: Payer: Self-pay

## 2024-04-11 ENCOUNTER — Ambulatory Visit

## 2024-04-11 DIAGNOSIS — M6281 Muscle weakness (generalized): Secondary | ICD-10-CM | POA: Diagnosis not present

## 2024-04-11 DIAGNOSIS — M5459 Other low back pain: Secondary | ICD-10-CM | POA: Diagnosis not present

## 2024-04-11 DIAGNOSIS — M62830 Muscle spasm of back: Secondary | ICD-10-CM | POA: Diagnosis not present

## 2024-04-11 DIAGNOSIS — R2689 Other abnormalities of gait and mobility: Secondary | ICD-10-CM | POA: Diagnosis not present

## 2024-04-11 NOTE — Therapy (Signed)
 OUTPATIENT PHYSICAL THERAPY THORACOLUMBAR TREATMENT    Patient Name: Sheila Hanson MRN: [id number] DOB:[birth year], 89 y.o., female Today's Date: 04/11/2024  END OF SESSION:  PT End of Session - 04/11/24 1345     Visit Number 14    Date for PT Re-Evaluation 05/11/24    Authorization Type Medicare    Progress Note Due on Visit 20    PT Start Time 1345    PT Stop Time 1430    PT Time Calculation (min) 45 min    Activity Tolerance Patient tolerated treatment well    Behavior During Therapy [facility] for tasks assessed/performed             Past Medical History:  Diagnosis Date   Cirrhosis (HCC)    Colon adenoma 08/16/2005   Coronary artery disease    Diabetes mellitus without complication (HCC)    GERD (gastroesophageal reflux disease)    Humeral head fracture    Hypercholesteremia    Hypertension    Osteopenia    Positive PPD    Varicose veins    Wrist fracture    History reviewed. No pertinent surgical history. Patient Active Problem List   Diagnosis Date Noted   Pain due to onychomycosis of toenails of both feet 01/06/2022   Type 2 diabetes mellitus with vascular disease (HCC) 01/06/2022    PCP: Charlott, MD  REFERRING PROVIDER: [place], PA  REFERRING DIAG: right back and flank pain  Rationale for Evaluation and Treatment: Rehabilitation  THERAPY DIAG:  Other low back pain  Muscle weakness (generalized)  Muscle spasm of back  Other abnormalities of gait and mobility  ONSET DATE: 01/16/24  SUBJECTIVE:                                                                                                                                                                                           SUBJECTIVE STATEMENT: 04/11/24:  reports no flank pain today, much better over the weekend.  Still staggering a lot   Patient is a 89 y.o. female who was seen today for physical therapy evaluation and treatment for low back and right flank pain.  She reports that she did  care for her husband who passed away in 2024-06-17.  She reports that she wore her self out with keeping him at home.  She does have some liver issues.  She does recall lifting about 45# and really caused a pulling  PERTINENT HISTORY:  See above  PAIN:  Are you having pain? Yes: NPRS scale: 2/10 Pain location: back  Pain description: Discomfort Aggravating factors: standing, bending, shopping pain will go up to 9-10/10 Relieving factors:  warm shower at best pain a 3-4/10  PRECAUTIONS: None  RED FLAGS: None   WEIGHT BEARING RESTRICTIONS: No  FALLS:  Has patient fallen in last 6 months? No  LIVING ENVIRONMENT: Lives with: lives alone Lives in: House/apartment Stairs: has a ramp into the home, no stairs in the home Has following equipment at home: Single point cane and Shower bench  OCCUPATION: retired  PLOF: Independent walks daily with a SPC 15 minutes, was shopping and cleaning house, did some plant care  PATIENT GOALS: have less pain  NEXT MD VISIT: none  OBJECTIVE:  Note: Objective measures were completed at Evaluation unless otherwise noted.  DIAGNOSTIC FINDINGS:  No kidney stones  PATIENT SURVEYS:  ODI: 60%  COGNITION: Overall cognitive status: Within functional limits for tasks assessed     SENSATION: WFL  MUSCLE LENGTH: Mild tightness in the HS, calves and piriformis POSTURE: rounded shoulders, forward head, and decreased lumbar lordosis  PALPATION: She is very tight and tender in the low back and into the flank bilaterally  LUMBAR ROM:   AROM eval 03/22/23  Flexion Decreased 50% Limited 25%  Extension 50% Limited 25%  Right lateral flexion 50% WFL  Left lateral flexion 50% WFL  Right rotation 50% WFL  Left rotation     (Blank rows = not tested)  LOWER EXTREMITY MMT   all increase pain  Active  Right eval Left eval  Hip flexion 4- 4-  Hip extension    Hip abduction    Hip adduction    Hip internal rotation    Hip external rotation     Knee flexion 4 4  Knee extension 4- 4-  Ankle dorsiflexion    Ankle plantarflexion    Ankle inversion    Ankle eversion     (Blank rows = not tested)  LOWER EXTREMITY ROM:  WNL    LUMBAR SPECIAL TESTS:  Straight leg raise test: Positive and Slump test: Positive  FUNCTIONAL TESTS:  5 times sit to stand: 25 seconds Timed up and go (TUG): 22 seconds with a SPC 3 minute walk test: not tested  GAIT: Distance walked: 50 feet Assistive device utilized: Single point cane Level of assistance: Modified independence Comments: slow antalgic on the right, mild unsteadiness with turning  TREATMENT DATE:  04/11/24 Gait outdoors x 6 min over asphalt, inclines, curbs, needed CGA due to weaving Seated rows 20# 15 Seated hamstring curls 25# 10 x 2 Airex static standing feet together , no hands 30 sec Airex B heel raises, at bar, x 15 reps Standing on airex for alt toe taps to cone Seated trunk stretch, hands on large physioball for forward stretch, side/side stretch  Sit to stand with forward punches with 3# cuff wt from hi low mat 10x   04/04/24:  Nustep Level 5 x 6 min Side lying on L for deep cross friciton massage R quadratus lumborum Therapist provided longitudinal distraction R lateral lumbar, pelvis, with pt in L side lying with R hip and knee flexed over L Supine for lat pull overs with 3# cuff wt gripped B hands Supine with humerus vertical with post capsule stretch, with LTR opposite side to stretch posterior chain / trunk, 10 reps each side Seated rows 15# 15x Sit to stand with forward punches with 3# cuff wt from hi low mat 10x Seated forward, side to side stretches with B hands on large physioball Walking ball toss 100' Lat rows 15# 15x   04/02/24 Nustep level 5 x 6 minutes Gait  outside down slope, up slope some uneven terrain walking negotiating curbs,  very fatigued, SOB and veered to the right multiple times, some CGA for curbs On airex ball toss On airex reaching On  airex cone toe touches Walking ball toss Direction changes 20# HS curls  03/26/24:  Nustep L5 x 6 min Ue's and Les Bilat shoulder Flex 3lb WaTE x10 Supine for 65 cm ball LTR, K2C Rows & Lats 15lb 2x10 Shoulder Ext green 2x10 in standing Wall slides into R shoulder abduction with R pelvic shift to stretch R Q L.   Side lying L for deep cross friction massage R QL, R lumbar parspinals.   03/21/24 NuStep L5 x 6 min Bilat shoulder Flex 3lb WaTE x10 Rows & Lats 15lb 2x10 Shoulder Ext green 2x10 Sit to stand w/ 3lb chest press x10, x5  03/14/24 Bike L3 x 3 min Bilat shoulder Flex 3lb WaTE x10 Seated Rows red 2x10 Shoulder Ext red 2x10 Green tband clamshells Feet on ball K2C, rotation, bridge, isometric abs Passive stretching to HS,Piriformis, ITB, and K2C  STM to R flank  03/12/24 Nustep level 5 x 6 minutes Red tband rows Red tband extension Green tband clamshells Ball b/n knees squeeze Feet on ball K2C, rotation, bridge, isometric abs Left side lying STM to the right flank lumbar area  03/07/24 Left sidelying STM to the right lumbar area and into the right flank Some gentle passive stretch of the trunk and LE's Feet on ball K2C, rotation, small bridge, isometric abs Green tband clamshells Ball b/n knees squeeze Red tband row Red tband extension cues for posture an core    PATIENT EDUCATION:  Education details: HEP Person educated: Patient Education method: Programmer, multimedia, Facilities manager, Verbal cues, and Handouts Education comprehension: verbalized understanding  HOME EXERCISE PROGRAM: Access Code: [email or web address] URL: [email or web address] Date: 03/05/2024 Prepared by: Kelly Ranieri  Exercises - Seated Shoulder Shrugs  - 1 x daily - 7 x weekly - 2 sets - 10 reps - 3 hold - Seated Scapular Retraction  - 1 x daily - 7 x weekly - 2 sets - 10 reps - 3 hold - Supine Posterior Pelvic Tilt  - 1 x daily - 7 x weekly - 2 sets - 10 reps - 3 hold - Hooklying Single  Knee to Chest Stretch  - 1 x daily - 7 x weekly - 2 sets - 10 reps - 5 hold - Supine Lower Trunk Rotation  - 1 x daily - 7 x weekly - 2 sets - 10 reps - 5 hold - Standing Sidebends  - 1 x daily - 7 x weekly - 3 sets - 10 reps - Sidelying Open Book Thoracic Lumbar Rotation and Extension  - 1 x daily - 7 x weekly - 3 sets - 10 reps Access Code: [email or web address] URL: [email or web address] Date: 02/09/2024 Prepared by: Ozell Mainland  Exercises - Seated Shoulder Shrugs  - 1 x daily - 7 x weekly - 2 sets - 10 reps - 3 hold - Seated Scapular Retraction  - 1 x daily - 7 x weekly - 2 sets - 10 reps - 3 hold - Supine Posterior Pelvic Tilt  - 1 x daily - 7 x weekly - 2 sets - 10 reps - 3 hold - Hooklying Single Knee to Chest Stretch  - 1 x daily - 7 x weekly - 2 sets - 10 reps - 5 hold - Supine Lower Trunk Rotation  - 1 x daily - 7 x weekly - 2 sets -  10 reps - 5 hold  ASSESSMENT:  CLINICAL IMPRESSION: Patient is a 89 y.o. female who participated today in physical therapy treatment for low back and right flank pain. Fortunately she is not experiencing any flank pain today. Still unsteady, so focused on overall strength and stability.  She should continue to benefit from skilled PT to address her pain as well as her overall stability. She did ask today about when her end date for PT will be . Advised her that her care plan is until 9/26 and we can/ will modify as needed .  OBJECTIVE IMPAIRMENTS: Abnormal gait, cardiopulmonary status limiting activity, decreased activity tolerance, decreased balance, decreased endurance, decreased mobility, difficulty walking, decreased ROM, decreased strength, increased fascial restrictions, increased muscle spasms, impaired flexibility, improper body mechanics, postural dysfunction, and pain.   ACTIVITY LIMITATIONS: carrying, lifting, bending, sitting, standing, sleeping, stairs, and locomotion level  PARTICIPATION LIMITATIONS: cleaning, shopping, community  activity, and yard work  PERSONAL FACTORS: Age, Fitness, and Time since onset of injury/illness/exacerbation are also affecting patient's functional outcome.   REHAB POTENTIAL: Good  CLINICAL DECISION MAKING: Evolving/moderate complexity  EVALUATION COMPLEXITY: Moderate   GOALS: Goals reviewed with patient? Yes  SHORT TERM GOALS: Target date: 03/04/24  Independent with initial HEP Baseline: Goal status: met 02/29/24   LONG TERM GOALS: Target date: 05/11/24  Independent with advanced HEP Baseline:  Goal status: ongoing 03/12/24  2.  Understand proper posture and body mechanics Baseline:  Goal status: ongoing 02/29/24  3.  Decrease pain 50% Baseline:  Goal status: progressing 03/07/24, Met 75% 03/21/24 827/25: no pain today  4.  Increase lumbar ROM 25% Baseline:  Goal status: met 02/29/24  5.  Report able to clean her house Baseline: currently is having someone clean Goal status: ongoing 03/12/24, Ongoing 03/21/24  6.  Able to shop for groceries Baseline: family members are currently doing this Goal status: ongoing 03/21/24 04/04/24:  ongoing   PLAN:  PT FREQUENCY: 2x/week  PT DURATION: 12 weeks  PLANNED INTERVENTIONS: 97164- PT Re-evaluation, 97110-Therapeutic exercises, 97530- Therapeutic activity, 97112- Neuromuscular re-education, 97535- Self Care, 02859- Manual therapy, [id number]- Gait training, [phone]- Electrical stimulation (unattended), [id number]- Ultrasound, 02987- Traction (mechanical), [id number]- Ionotophoresis 4mg /ml Dexamethasone, Patient/Family education, Balance training, Stair training, Taping, Joint mobilization, Cryotherapy, and Moist heat.  PLAN FOR NEXT SESSION: see how she is doing , advance exercises as needed.    Cameren Odwyer L Shericka Johnstone, PT,DPT, OCS 04/11/2024, 3:29 PM

## 2024-04-17 ENCOUNTER — Encounter: Payer: Self-pay | Admitting: Physical Therapy

## 2024-04-17 ENCOUNTER — Ambulatory Visit: Attending: Physician Assistant | Admitting: Physical Therapy

## 2024-04-17 DIAGNOSIS — M6281 Muscle weakness (generalized): Secondary | ICD-10-CM | POA: Insufficient documentation

## 2024-04-17 DIAGNOSIS — M5459 Other low back pain: Secondary | ICD-10-CM | POA: Diagnosis not present

## 2024-04-17 DIAGNOSIS — R2689 Other abnormalities of gait and mobility: Secondary | ICD-10-CM | POA: Diagnosis not present

## 2024-04-17 DIAGNOSIS — M62830 Muscle spasm of back: Secondary | ICD-10-CM | POA: Diagnosis not present

## 2024-04-17 NOTE — Therapy (Signed)
 OUTPATIENT PHYSICAL THERAPY THORACOLUMBAR TREATMENT    Patient Name: TAINA LANDRY MRN: [id number] DOB:1934/06/16, 89 y.o., female Today's Date: 04/17/2024  END OF SESSION:  PT End of Session - 04/17/24 1346     Visit Number 15    Date for PT Re-Evaluation 05/11/24    PT Start Time 1347    PT Stop Time 1430    PT Time Calculation (min) 43 min    Activity Tolerance Patient tolerated treatment well    Behavior During Therapy [facility] for tasks assessed/performed             Past Medical History:  Diagnosis Date   Cirrhosis (HCC)    Colon adenoma 08/16/2005   Coronary artery disease    Diabetes mellitus without complication (HCC)    GERD (gastroesophageal reflux disease)    Humeral head fracture    Hypercholesteremia    Hypertension    Osteopenia    Positive PPD    Varicose veins    Wrist fracture    History reviewed. No pertinent surgical history. Patient Active Problem List   Diagnosis Date Noted   Pain due to onychomycosis of toenails of both feet 01/06/2022   Type 2 diabetes mellitus with vascular disease (HCC) 01/06/2022    PCP: Charlott, MD  REFERRING PROVIDER: [place], PA  REFERRING DIAG: right back and flank pain  Rationale for Evaluation and Treatment: Rehabilitation  THERAPY DIAG:  Other low back pain  Muscle weakness (generalized)  Muscle spasm of back  Other abnormalities of gait and mobility  ONSET DATE: 01/16/24  SUBJECTIVE:                                                                                                                                                                                           SUBJECTIVE STATEMENT: Terrible, dizzy today.  Patient is a 89 y.o. female who was seen today for physical therapy evaluation and treatment for low back and right flank pain.  She reports that she did care for her husband who passed away in 2024-07-23.  She reports that she wore her self out with keeping him at home.  She does have some  liver issues.  She does recall lifting about 45# and really caused a pulling  PERTINENT HISTORY:  See above  PAIN:  Are you having pain? Yes: NPRS scale: 0/10 Pain location: back  Pain description: Discomfort Aggravating factors: standing, bending, shopping pain will go up to 9-10/10 Relieving factors: warm shower at best pain a 3-4/10  PRECAUTIONS: None  RED FLAGS: None   WEIGHT BEARING RESTRICTIONS: No  FALLS:  Has patient fallen in last 6 months?  No  LIVING ENVIRONMENT: Lives with: lives alone Lives in: House/apartment Stairs: has a ramp into the home, no stairs in the home Has following equipment at home: Single point cane and Shower bench  OCCUPATION: retired  PLOF: Independent walks daily with a SPC 15 minutes, was shopping and cleaning house, did some plant care  PATIENT GOALS: have less pain  NEXT MD VISIT: none  OBJECTIVE:  Note: Objective measures were completed at Evaluation unless otherwise noted.  DIAGNOSTIC FINDINGS:  No kidney stones  PATIENT SURVEYS:  ODI: 60%  COGNITION: Overall cognitive status: Within functional limits for tasks assessed     SENSATION: WFL  MUSCLE LENGTH: Mild tightness in the HS, calves and piriformis POSTURE: rounded shoulders, forward head, and decreased lumbar lordosis  PALPATION: She is very tight and tender in the low back and into the flank bilaterally  LUMBAR ROM:   AROM eval 03/22/23  Flexion Decreased 50% Limited 25%  Extension 50% Limited 25%  Right lateral flexion 50% WFL  Left lateral flexion 50% WFL  Right rotation 50% WFL  Left rotation     (Blank rows = not tested)  LOWER EXTREMITY MMT   all increase pain  Active  Right eval Left eval  Hip flexion 4- 4-  Hip extension    Hip abduction    Hip adduction    Hip internal rotation    Hip external rotation    Knee flexion 4 4  Knee extension 4- 4-  Ankle dorsiflexion    Ankle plantarflexion    Ankle inversion    Ankle eversion     (Blank  rows = not tested)  LOWER EXTREMITY ROM:  WNL    LUMBAR SPECIAL TESTS:  Straight leg raise test: Positive and Slump test: Positive  FUNCTIONAL TESTS:  5 times sit to stand: 25 seconds Timed up and go (TUG): 22 seconds with a SPC 3 minute walk test: not tested  GAIT: Distance walked: 50 feet Assistive device utilized: Single point cane Level of assistance: Modified independence Comments: slow antalgic on the right, mild unsteadiness with turning  TREATMENT DATE:  04/17/24 BP 126/77 NuStep L 5 x 6 min HS curls 20lb 2x10 Leg Ext 5lb 2x10 Seated Rows green 2x10 Shoulder Ext green 2x10  Hip add ball squeeze 2x10  04/11/24 Gait outdoors x 6 min over asphalt, inclines, curbs, needed CGA due to weaving Seated rows 20# 15 Seated hamstring curls 25# 10 x 2 Airex static standing feet together , no hands 30 sec Airex B heel raises, at bar, x 15 reps Standing on airex for alt toe taps to cone Seated trunk stretch, hands on large physioball for forward stretch, side/side stretch  Sit to stand with forward punches with 3# cuff wt from hi low mat 10x   04/04/24:  Nustep Level 5 x 6 min Side lying on L for deep cross friciton massage R quadratus lumborum Therapist provided longitudinal distraction R lateral lumbar, pelvis, with pt in L side lying with R hip and knee flexed over L Supine for lat pull overs with 3# cuff wt gripped B hands Supine with humerus vertical with post capsule stretch, with LTR opposite side to stretch posterior chain / trunk, 10 reps each side Seated rows 15# 15x Sit to stand with forward punches with 3# cuff wt from hi low mat 10x Seated forward, side to side stretches with B hands on large physioball Walking ball toss 100' Lat rows 15# 15x   04/02/24 Nustep level 5 x 6  minutes Gait outside down slope, up slope some uneven terrain walking negotiating curbs,  very fatigued, SOB and veered to the right multiple times, some CGA for curbs On airex ball toss On  airex reaching On airex cone toe touches Walking ball toss Direction changes 20# HS curls  03/26/24:  Nustep L5 x 6 min Ue's and Les Bilat shoulder Flex 3lb WaTE x10 Supine for 65 cm ball LTR, K2C Rows & Lats 15lb 2x10 Shoulder Ext green 2x10 in standing Wall slides into R shoulder abduction with R pelvic shift to stretch R Q L.   Side lying L for deep cross friction massage R QL, R lumbar parspinals.   03/21/24 NuStep L5 x 6 min Bilat shoulder Flex 3lb WaTE x10 Rows & Lats 15lb 2x10 Shoulder Ext green 2x10 Sit to stand w/ 3lb chest press x10, x5  03/14/24 Bike L3 x 3 min Bilat shoulder Flex 3lb WaTE x10 Seated Rows red 2x10 Shoulder Ext red 2x10 Green tband clamshells Feet on ball K2C, rotation, bridge, isometric abs Passive stretching to HS,Piriformis, ITB, and K2C  STM to R flank  03/12/24 Nustep level 5 x 6 minutes Red tband rows Red tband extension Green tband clamshells Ball b/n knees squeeze Feet on ball K2C, rotation, bridge, isometric abs Left side lying STM to the right flank lumbar area  03/07/24 Left sidelying STM to the right lumbar area and into the right flank Some gentle passive stretch of the trunk and LE's Feet on ball K2C, rotation, small bridge, isometric abs Green tband clamshells Ball b/n knees squeeze Red tband row Red tband extension cues for posture an core    PATIENT EDUCATION:  Education details: HEP Person educated: Patient Education method: Programmer, multimedia, Facilities manager, Verbal cues, and Handouts Education comprehension: verbalized understanding  HOME EXERCISE PROGRAM: Access Code: [email or web address] URL: [email or web address] Date: 03/05/2024 Prepared by: Amy Speaks  Exercises - Seated Shoulder Shrugs  - 1 x daily - 7 x weekly - 2 sets - 10 reps - 3 hold - Seated Scapular Retraction  - 1 x daily - 7 x weekly - 2 sets - 10 reps - 3 hold - Supine Posterior Pelvic Tilt  - 1 x daily - 7 x weekly - 2 sets - 10 reps - 3 hold -  Hooklying Single Knee to Chest Stretch  - 1 x daily - 7 x weekly - 2 sets - 10 reps - 5 hold - Supine Lower Trunk Rotation  - 1 x daily - 7 x weekly - 2 sets - 10 reps - 5 hold - Standing Sidebends  - 1 x daily - 7 x weekly - 3 sets - 10 reps - Sidelying Open Book Thoracic Lumbar Rotation and Extension  - 1 x daily - 7 x weekly - 3 sets - 10 reps Access Code: [email or web address] URL: [email or web address] Date: 02/09/2024 Prepared by: Ozell Mainland  Exercises - Seated Shoulder Shrugs  - 1 x daily - 7 x weekly - 2 sets - 10 reps - 3 hold - Seated Scapular Retraction  - 1 x daily - 7 x weekly - 2 sets - 10 reps - 3 hold - Supine Posterior Pelvic Tilt  - 1 x daily - 7 x weekly - 2 sets - 10 reps - 3 hold - Hooklying Single Knee to Chest Stretch  - 1 x daily - 7 x weekly - 2 sets - 10 reps - 5 hold - Supine Lower Trunk Rotation  - 1 x daily - 7 x weekly - 2  sets - 10 reps - 5 hold  ASSESSMENT:  CLINICAL IMPRESSION: Patient is a 89 y.o. female who participated today in physical therapy treatment for low back and right flank pain. Fortunately she is not experiencing any flank pain today. She enters with repots of dizziness and fatigue. BP taken and was normal. Session intensity lessened due to subjective repots. She does appear to be fatigue during the duration of session. Interventions focused on postural and LE strength.  Cues for full ROM needed with curls and extensions.  She should continue to benefit from skilled PT to address her pain as well as her overall stability.    OBJECTIVE IMPAIRMENTS: Abnormal gait, cardiopulmonary status limiting activity, decreased activity tolerance, decreased balance, decreased endurance, decreased mobility, difficulty walking, decreased ROM, decreased strength, increased fascial restrictions, increased muscle spasms, impaired flexibility, improper body mechanics, postural dysfunction, and pain.   ACTIVITY LIMITATIONS: carrying, lifting, bending, sitting,  standing, sleeping, stairs, and locomotion level  PARTICIPATION LIMITATIONS: cleaning, shopping, community activity, and yard work  PERSONAL FACTORS: Age, Fitness, and Time since onset of injury/illness/exacerbation are also affecting patient's functional outcome.   REHAB POTENTIAL: Good  CLINICAL DECISION MAKING: Evolving/moderate complexity  EVALUATION COMPLEXITY: Moderate   GOALS: Goals reviewed with patient? Yes  SHORT TERM GOALS: Target date: 03/04/24  Independent with initial HEP Baseline: Goal status: met 02/29/24   LONG TERM GOALS: Target date: 05/11/24  Independent with advanced HEP Baseline:  Goal status: ongoing 03/12/24  2.  Understand proper posture and body mechanics Baseline:  Goal status: ongoing 02/29/24  3.  Decrease pain 50% Baseline:  Goal status: progressing 03/07/24, Met 75% 03/21/24 827/25: no pain today  4.  Increase lumbar ROM 25% Baseline:  Goal status: met 02/29/24  5.  Report able to clean her house Baseline: currently is having someone clean Goal status: ongoing 03/12/24, Ongoing 03/21/24  6.  Able to shop for groceries Baseline: family members are currently doing this Goal status: ongoing 03/21/24 04/04/24:  ongoing   PLAN:  PT FREQUENCY: 2x/week  PT DURATION: 12 weeks  PLANNED INTERVENTIONS: 97164- PT Re-evaluation, 97110-Therapeutic exercises, 97530- Therapeutic activity, 97112- Neuromuscular re-education, 97535- Self Care, 02859- Manual therapy, [id number]- Gait training, [phone]- Electrical stimulation (unattended), [id number]- Ultrasound, 02987- Traction (mechanical), [id number]- Ionotophoresis 4mg /ml Dexamethasone, Patient/Family education, Balance training, Stair training, Taping, Joint mobilization, Cryotherapy, and Moist heat.  PLAN FOR NEXT SESSION: see how she is doing , advance exercises as needed.    Tanda KANDICE Sorrow, PTA 04/17/2024, 1:47 PM

## 2024-04-19 ENCOUNTER — Encounter: Payer: Self-pay | Admitting: Physical Therapy

## 2024-04-19 ENCOUNTER — Ambulatory Visit: Admitting: Physical Therapy

## 2024-04-19 DIAGNOSIS — M5459 Other low back pain: Secondary | ICD-10-CM

## 2024-04-19 DIAGNOSIS — R2689 Other abnormalities of gait and mobility: Secondary | ICD-10-CM

## 2024-04-19 DIAGNOSIS — M62830 Muscle spasm of back: Secondary | ICD-10-CM | POA: Diagnosis not present

## 2024-04-19 DIAGNOSIS — M6281 Muscle weakness (generalized): Secondary | ICD-10-CM

## 2024-04-19 NOTE — Therapy (Signed)
 OUTPATIENT PHYSICAL THERAPY THORACOLUMBAR TREATMENT    Patient Name: ALAIAH Hanson MRN: [id number] DOB:[birth year], 89 y.o., female Today's Date: 04/19/2024  END OF SESSION:  PT End of Session - 04/19/24 1302     Visit Number 16    Date for PT Re-Evaluation 05/11/24    PT Start Time 1302    PT Stop Time 1345    PT Time Calculation (min) 43 min    Activity Tolerance Patient tolerated treatment well    Behavior During Therapy [facility] for tasks assessed/performed             Past Medical History:  Diagnosis Date   Cirrhosis (HCC)    Colon adenoma 08/16/2005   Coronary artery disease    Diabetes mellitus without complication (HCC)    GERD (gastroesophageal reflux disease)    Humeral head fracture    Hypercholesteremia    Hypertension    Osteopenia    Positive PPD    Varicose veins    Wrist fracture    History reviewed. No pertinent surgical history. Patient Active Problem List   Diagnosis Date Noted   Pain due to onychomycosis of toenails of both feet 01/06/2022   Type 2 diabetes mellitus with vascular disease (HCC) 01/06/2022    PCP: Charlott, MD  REFERRING PROVIDER: [place], PA  REFERRING DIAG: right back and flank pain  Rationale for Evaluation and Treatment: Rehabilitation  THERAPY DIAG:  Other low back pain  Muscle weakness (generalized)  Muscle spasm of back  Other abnormalities of gait and mobility  ONSET DATE: 01/16/24  SUBJECTIVE:                                                                                                                                                                                           SUBJECTIVE STATEMENT: Feeling better today  Patient is a 89 y.o. female who was seen today for physical therapy evaluation and treatment for low back and right flank pain.  She reports that she did care for her husband who passed away in Jul 16, 2024.  She reports that she wore her self out with keeping him at home.  She does have some  liver issues.  She does recall lifting about 45# and really caused a pulling  PERTINENT HISTORY:  See above  PAIN:  Are you having pain? Yes: NPRS scale: 0/10 Pain location: back  Pain description: Discomfort Aggravating factors: standing, bending, shopping pain will go up to 9-10/10 Relieving factors: warm shower at best pain a 3-4/10  PRECAUTIONS: None  RED FLAGS: None   WEIGHT BEARING RESTRICTIONS: No  FALLS:  Has patient fallen in last 6 months?  No  LIVING ENVIRONMENT: Lives with: lives alone Lives in: House/apartment Stairs: has a ramp into the home, no stairs in the home Has following equipment at home: Single point cane and Shower bench  OCCUPATION: retired  PLOF: Independent walks daily with a SPC 15 minutes, was shopping and cleaning house, did some plant care  PATIENT GOALS: have less pain  NEXT MD VISIT: none  OBJECTIVE:  Note: Objective measures were completed at Evaluation unless otherwise noted.  DIAGNOSTIC FINDINGS:  No kidney stones  PATIENT SURVEYS:  ODI: 60%  COGNITION: Overall cognitive status: Within functional limits for tasks assessed     SENSATION: WFL  MUSCLE LENGTH: Mild tightness in the HS, calves and piriformis POSTURE: rounded shoulders, forward head, and decreased lumbar lordosis  PALPATION: She is very tight and tender in the low back and into the flank bilaterally  LUMBAR ROM:   AROM eval 03/22/23  Flexion Decreased 50% Limited 25%  Extension 50% Limited 25%  Right lateral flexion 50% WFL  Left lateral flexion 50% WFL  Right rotation 50% WFL  Left rotation     (Blank rows = not tested)  LOWER EXTREMITY MMT   all increase pain  Active  Right eval Left eval Right 04/19/24 Left 04/19/24  Hip flexion 4- 4- 4 4  Hip extension      Hip abduction      Hip adduction      Hip internal rotation      Hip external rotation      Knee flexion 4 4 4+ 5  Knee extension 4- 4- 4 4-  Ankle dorsiflexion      Ankle plantarflexion       Ankle inversion      Ankle eversion       (Blank rows = not tested)  LOWER EXTREMITY ROM:  WNL    LUMBAR SPECIAL TESTS:  Straight leg raise test: Positive and Slump test: Positive  FUNCTIONAL TESTS:  5 times sit to stand: 25 seconds Timed up and go (TUG): 22 seconds with a SPC 3 minute walk test: not tested  GAIT: Distance walked: 50 feet Assistive device utilized: Single point cane Level of assistance: Modified independence Comments: slow antalgic on the right, mild unsteadiness with turning  TREATMENT DATE:  04/19/24 NuStep L 5 x 7 min Goals Sit to standing holding blue ball 2x10 Rows & Lats 20lb 2x10 Shoulder Ext 5lb 2x10 6in step ups x5 each HS curls 20lb 2x10 Leg Ext 5lb 2x10  04/17/24 BP 126/77 NuStep L 5 x 6 min HS curls 20lb 2x10 Leg Ext 5lb 2x10 Seated Rows green 2x10 Shoulder Ext green 2x10  Hip add ball squeeze 2x10  04/11/24 Gait outdoors x 6 min over asphalt, inclines, curbs, needed CGA due to weaving Seated rows 20# 15 Seated hamstring curls 25# 10 x 2 Airex static standing feet together , no hands 30 sec Airex B heel raises, at bar, x 15 reps Standing on airex for alt toe taps to cone Seated trunk stretch, hands on large physioball for forward stretch, side/side stretch  Sit to stand with forward punches with 3# cuff wt from hi low mat 10x   04/04/24:  Nustep Level 5 x 6 min Side lying on L for deep cross friciton massage R quadratus lumborum Therapist provided longitudinal distraction R lateral lumbar, pelvis, with pt in L side lying with R hip and knee flexed over L Supine for lat pull overs with 3# cuff wt gripped B hands Supine with humerus  vertical with post capsule stretch, with LTR opposite side to stretch posterior chain / trunk, 10 reps each side Seated rows 15# 15x Sit to stand with forward punches with 3# cuff wt from hi low mat 10x Seated forward, side to side stretches with B hands on large physioball Walking ball toss 100' Lat  rows 15# 15x   04/02/24 Nustep level 5 x 6 minutes Gait outside down slope, up slope some uneven terrain walking negotiating curbs,  very fatigued, SOB and veered to the right multiple times, some CGA for curbs On airex ball toss On airex reaching On airex cone toe touches Walking ball toss Direction changes 20# HS curls    PATIENT EDUCATION:  Education details: HEP Person educated: Patient Education method: Programmer, multimedia, Facilities manager, Verbal cues, and Handouts Education comprehension: verbalized understanding  HOME EXERCISE PROGRAM: Access Code: [email or web address] URL: [email or web address] Date: 03/05/2024 Prepared by: Amy Speaks  Exercises - Seated Shoulder Shrugs  - 1 x daily - 7 x weekly - 2 sets - 10 reps - 3 hold - Seated Scapular Retraction  - 1 x daily - 7 x weekly - 2 sets - 10 reps - 3 hold - Supine Posterior Pelvic Tilt  - 1 x daily - 7 x weekly - 2 sets - 10 reps - 3 hold - Hooklying Single Knee to Chest Stretch  - 1 x daily - 7 x weekly - 2 sets - 10 reps - 5 hold - Supine Lower Trunk Rotation  - 1 x daily - 7 x weekly - 2 sets - 10 reps - 5 hold - Standing Sidebends  - 1 x daily - 7 x weekly - 3 sets - 10 reps - Sidelying Open Book Thoracic Lumbar Rotation and Extension  - 1 x daily - 7 x weekly - 3 sets - 10 reps Access Code: [email or web address] URL: [email or web address] Date: 02/09/2024 Prepared by: Ozell Mainland  Exercises - Seated Shoulder Shrugs  - 1 x daily - 7 x weekly - 2 sets - 10 reps - 3 hold - Seated Scapular Retraction  - 1 x daily - 7 x weekly - 2 sets - 10 reps - 3 hold - Supine Posterior Pelvic Tilt  - 1 x daily - 7 x weekly - 2 sets - 10 reps - 3 hold - Hooklying Single Knee to Chest Stretch  - 1 x daily - 7 x weekly - 2 sets - 10 reps - 5 hold - Supine Lower Trunk Rotation  - 1 x daily - 7 x weekly - 2 sets - 10 reps - 5 hold  ASSESSMENT:  CLINICAL IMPRESSION: Patient is a 89 y.o. female who participated today in physical  therapy treatment for low back and right flank pain. Fortunately she is not experiencing any flank pain today. She enters doing well with no pain. Objective measures taken and she has progressed towards all goals. She has help with cleaning an groceries.  Interventions focused on postural and LE strength. All interventions well. Tactile cues needed with extensions and rows for posture.  Cues for full ROM needed with curls and extensions.  She should continue to benefit from skilled PT to address her pain as well as her overall stability.    OBJECTIVE IMPAIRMENTS: Abnormal gait, cardiopulmonary status limiting activity, decreased activity tolerance, decreased balance, decreased endurance, decreased mobility, difficulty walking, decreased ROM, decreased strength, increased fascial restrictions, increased muscle spasms, impaired flexibility, improper body mechanics, postural dysfunction, and pain.   ACTIVITY LIMITATIONS: carrying, lifting, bending, sitting, standing, sleeping,  stairs, and locomotion level  PARTICIPATION LIMITATIONS: cleaning, shopping, community activity, and yard work  PERSONAL FACTORS: Age, Fitness, and Time since onset of injury/illness/exacerbation are also affecting patient's functional outcome.   REHAB POTENTIAL: Good  CLINICAL DECISION MAKING: Evolving/moderate complexity  EVALUATION COMPLEXITY: Moderate   GOALS: Goals reviewed with patient? Yes  SHORT TERM GOALS: Target date: 03/04/24  Independent with initial HEP Baseline: Goal status: met 02/29/24   LONG TERM GOALS: Target date: 05/11/24  Independent with advanced HEP Baseline:  Goal status: ongoing 03/12/24  2.  Understand proper posture and body mechanics Baseline:  Goal status: ongoing 02/29/24, 04/19/24 Progressing   3.  Decrease pain 50% Baseline:  Goal status: progressing 03/07/24, Met 75% 03/21/24 827/25: no pain today  4.  Increase lumbar ROM 25% Baseline:  Goal status: met 02/29/24  5.  Report able  to clean her house Baseline: currently is having someone clean Goal status: ongoing 03/12/24, Ongoing 03/21/24, Ongoing 04/19/24 cleaning lady  6.  Able to shop for groceries Baseline: family members are currently doing this Goal status: ongoing 03/21/24 04/04/24:  ongoing, ongoing 04/19/24 Kids are doing it for her  PLAN:  PT FREQUENCY: 2x/week  PT DURATION: 12 weeks  PLANNED INTERVENTIONS: 97164- PT Re-evaluation, 97110-Therapeutic exercises, 97530- Therapeutic activity, 97112- Neuromuscular re-education, 97535- Self Care, 02859- Manual therapy, [id number]- Gait training, [phone]- Electrical stimulation (unattended), [id number]- Ultrasound, 02987- Traction (mechanical), [id number]- Ionotophoresis 4mg /ml Dexamethasone, Patient/Family education, Balance training, Stair training, Taping, Joint mobilization, Cryotherapy, and Moist heat.  PLAN FOR NEXT SESSION: see how she is doing , advance exercises as needed.    Sheila Hanson, PTA 04/19/2024, 1:02 PM

## 2024-04-23 ENCOUNTER — Ambulatory Visit: Admitting: Physical Therapy

## 2024-04-23 ENCOUNTER — Encounter: Payer: Self-pay | Admitting: Physical Therapy

## 2024-04-23 DIAGNOSIS — R2689 Other abnormalities of gait and mobility: Secondary | ICD-10-CM | POA: Diagnosis not present

## 2024-04-23 DIAGNOSIS — M5459 Other low back pain: Secondary | ICD-10-CM | POA: Diagnosis not present

## 2024-04-23 DIAGNOSIS — M62830 Muscle spasm of back: Secondary | ICD-10-CM | POA: Diagnosis not present

## 2024-04-23 DIAGNOSIS — M6281 Muscle weakness (generalized): Secondary | ICD-10-CM | POA: Diagnosis not present

## 2024-04-23 NOTE — Therapy (Signed)
 OUTPATIENT PHYSICAL THERAPY THORACOLUMBAR TREATMENT    Patient Name: Sheila Hanson MRN: [id number] DOB:26-May-1934, 89 y.o., female Today's Date: 04/23/2024  END OF SESSION:  PT End of Session - 04/23/24 1357     Visit Number 17    Date for PT Re-Evaluation 05/11/24    Authorization Type Medicare    PT Start Time 1358    PT Stop Time 1443    PT Time Calculation (min) 45 min    Activity Tolerance Patient tolerated treatment well    Behavior During Therapy [facility] for tasks assessed/performed             Past Medical History:  Diagnosis Date   Cirrhosis (HCC)    Colon adenoma 08/16/2005   Coronary artery disease    Diabetes mellitus without complication (HCC)    GERD (gastroesophageal reflux disease)    Humeral head fracture    Hypercholesteremia    Hypertension    Osteopenia    Positive PPD    Varicose veins    Wrist fracture    History reviewed. No pertinent surgical history. Patient Active Problem List   Diagnosis Date Noted   Pain due to onychomycosis of toenails of both feet 01/06/2022   Type 2 diabetes mellitus with vascular disease (HCC) 01/06/2022    PCP: Charlott, MD  REFERRING PROVIDER: Cle[place]and, PA  REFERRING DIAG: right back and flank pain  Rationale for Evaluation and Treatment: Rehabilitation  THERAPY DIAG:  Other low back pain  Muscle weakness (generalized)  Muscle spasm of back  Other abnormalities of gait and mobility  ONSET DATE: 01/16/24  SUBJECTIVE:                                                                                                                                                                                           SUBJECTIVE STATEMENT: I think I am getting better. Every now and then I will stumble  Patient is a 89 y.o. female who was seen today for physical therapy evaluation and treatment for low back and right flank pain.  She reports that she did care for her husband who passed away in 2024/07/17.  She reports  that she wore her self out with keeping him at home.  She does have some liver issues.  She does recall lifting about 45# and really caused a pulling  PERTINENT HISTORY:  See above  PAIN:  Are you having pain? Yes: NPRS scale: 0/10 Pain location: back  Pain description: Discomfort Aggravating factors: standing, bending, shopping pain will go up to 9-10/10 Relieving factors: warm shower at best pain a 3-4/10  PRECAUTIONS: None  RED FLAGS: None  WEIGHT BEARING RESTRICTIONS: No  FALLS:  Has patient fallen in last 6 months? No  LIVING ENVIRONMENT: Lives with: lives alone Lives in: House/apartment Stairs: has a ramp into the home, no stairs in the home Has following equipment at home: Single point cane and Shower bench  OCCUPATION: retired  PLOF: Independent walks daily with a SPC 15 minutes, was shopping and cleaning house, did some plant care  PATIENT GOALS: have less pain  NEXT MD VISIT: none  OBJECTIVE:  Note: Objective measures were completed at Evaluation unless otherwise noted.  DIAGNOSTIC FINDINGS:  No kidney stones  PATIENT SURVEYS:  ODI: 60%  COGNITION: Overall cognitive status: Within functional limits for tasks assessed     SENSATION: WFL  MUSCLE LENGTH: Mild tightness in the HS, calves and piriformis POSTURE: rounded shoulders, forward head, and decreased lumbar lordosis  PALPATION: She is very tight and tender in the low back and into the flank bilaterally  LUMBAR ROM:   AROM eval 03/22/23  Flexion Decreased 50% Limited 25%  Extension 50% Limited 25%  Right lateral flexion 50% WFL  Left lateral flexion 50% WFL  Right rotation 50% WFL  Left rotation     (Blank rows = not tested)  LOWER EXTREMITY MMT   all increase pain  Active  Right eval Left eval Right 04/19/24 Left 04/19/24  Hip flexion 4- 4- 4 4  Hip extension      Hip abduction      Hip adduction      Hip internal rotation      Hip external rotation      Knee flexion 4 4 4+ 5   Knee extension 4- 4- 4 4-  Ankle dorsiflexion      Ankle plantarflexion      Ankle inversion      Ankle eversion       (Blank rows = not tested)  LOWER EXTREMITY ROM:  WNL    LUMBAR SPECIAL TESTS:  Straight leg raise test: Positive and Slump test: Positive  FUNCTIONAL TESTS:  5 times sit to stand: 25 seconds Timed up and go (TUG): 22 seconds with a SPC 3 minute walk test: not tested  GAIT: Distance walked: 50 feet Assistive device utilized: Single point cane Level of assistance: Modified independence Comments: slow antalgic on the right, mild unsteadiness with turning  TREATMENT DATE:  04/23/24 Gait outside around the back building had to rest coming u hill, she was SOB. Nustep level 5 x 5 minutes On airex 5# straight arm pulls Side step on and off airex with CGA On airex ball toss 6 step ups with CGA LEg press 20# x 10, then single legs without weight x 8 each  04/19/24 NuStep L 5 x 7 min Goals Sit to standing holding blue ball 2x10 Rows & Lats 20lb 2x10 Shoulder Ext 5lb 2x10 6in step ups x5 each HS curls 20lb 2x10 Leg Ext 5lb 2x10  04/17/24 BP 126/77 NuStep L 5 x 6 min HS curls 20lb 2x10 Leg Ext 5lb 2x10 Seated Rows green 2x10 Shoulder Ext green 2x10  Hip add ball squeeze 2x10  04/11/24 Gait outdoors x 6 min over asphalt, inclines, curbs, needed CGA due to weaving Seated rows 20# 15 Seated hamstring curls 25# 10 x 2 Airex static standing feet together , no hands 30 sec Airex B heel raises, at bar, x 15 reps Standing on airex for alt toe taps to cone Seated trunk stretch, hands on large physioball for forward stretch, side/side stretch  Sit  to stand with forward punches with 3# cuff wt from hi low mat 10x   04/04/24:  Nustep Level 5 x 6 min Side lying on L for deep cross friciton massage R quadratus lumborum Therapist provided longitudinal distraction R lateral lumbar, pelvis, with pt in L side lying with R hip and knee flexed over L Supine for lat pull  overs with 3# cuff wt gripped B hands Supine with humerus vertical with post capsule stretch, with LTR opposite side to stretch posterior chain / trunk, 10 reps each side Seated rows 15# 15x Sit to stand with forward punches with 3# cuff wt from hi low mat 10x Seated forward, side to side stretches with B hands on large physioball Walking ball toss 100' Lat rows 15# 15x   04/02/24 Nustep level 5 x 6 minutes Gait outside down slope, up slope some uneven terrain walking negotiating curbs,  very fatigued, SOB and veered to the right multiple times, some CGA for curbs On airex ball toss On airex reaching On airex cone toe touches Walking ball toss Direction changes 20# HS curls    PATIENT EDUCATION:  Education details: HEP Person educated: Patient Education method: Programmer, multimedia, Facilities manager, Verbal cues, and Handouts Education comprehension: verbalized understanding  HOME EXERCISE PROGRAM: Access Code: [email or web address] URL: [email or web address] Date: 03/05/2024 Prepared by: Amy Speaks  Exercises - Seated Shoulder Shrugs  - 1 x daily - 7 x weekly - 2 sets - 10 reps - 3 hold - Seated Scapular Retraction  - 1 x daily - 7 x weekly - 2 sets - 10 reps - 3 hold - Supine Posterior Pelvic Tilt  - 1 x daily - 7 x weekly - 2 sets - 10 reps - 3 hold - Hooklying Single Knee to Chest Stretch  - 1 x daily - 7 x weekly - 2 sets - 10 reps - 5 hold - Supine Lower Trunk Rotation  - 1 x daily - 7 x weekly - 2 sets - 10 reps - 5 hold - Standing Sidebends  - 1 x daily - 7 x weekly - 3 sets - 10 reps - Sidelying Open Book Thoracic Lumbar Rotation and Extension  - 1 x daily - 7 x weekly - 3 sets - 10 reps Access Code: [email or web address] URL: [email or web address] Date: 02/09/2024 Prepared by: Ozell Mainland  Exercises - Seated Shoulder Shrugs  - 1 x daily - 7 x weekly - 2 sets - 10 reps - 3 hold - Seated Scapular Retraction  - 1 x daily - 7 x weekly - 2 sets - 10 reps - 3 hold -  Supine Posterior Pelvic Tilt  - 1 x daily - 7 x weekly - 2 sets - 10 reps - 3 hold - Hooklying Single Knee to Chest Stretch  - 1 x daily - 7 x weekly - 2 sets - 10 reps - 5 hold - Supine Lower Trunk Rotation  - 1 x daily - 7 x weekly - 2 sets - 10 reps - 5 hold  ASSESSMENT:  CLINICAL IMPRESSION: Patient is a 89 y.o. female who participated today in physical therapy treatment for low back and right flank pain. Fortunately she is not experiencing any flank pain today. She reports that she feels like she is doing much better with her pain and doing more, she has not returned to walking due to some fear.  We did walk today and she was short of breath.  She has difficulty on any dynamic surface and has difficulty stepping up  curbs and 6 steps needing CGA. she has help with cleaning an groceries.  Interventions focused on postural and LE strength. All interventions well. Tactile cues needed with extensions and rows for posture.  Cues for full ROM needed with curls and extensions.  She should continue to benefit from skilled PT to address her pain as well as her overall stability.    OBJECTIVE IMPAIRMENTS: Abnormal gait, cardiopulmonary status limiting activity, decreased activity tolerance, decreased balance, decreased endurance, decreased mobility, difficulty walking, decreased ROM, decreased strength, increased fascial restrictions, increased muscle spasms, impaired flexibility, improper body mechanics, postural dysfunction, and pain.   ACTIVITY LIMITATIONS: carrying, lifting, bending, sitting, standing, sleeping, stairs, and locomotion level  PARTICIPATION LIMITATIONS: cleaning, shopping, community activity, and yard work  PERSONAL FACTORS: Age, Fitness, and Time since onset of injury/illness/exacerbation are also affecting patient's functional outcome.   REHAB POTENTIAL: Good  CLINICAL DECISION MAKING: Evolving/moderate complexity  EVALUATION COMPLEXITY: Moderate   GOALS: Goals reviewed with  patient? Yes  SHORT TERM GOALS: Target date: 03/04/24  Independent with initial HEP Baseline: Goal status: met 02/29/24   LONG TERM GOALS: Target date: 05/11/24  Independent with advanced HEP Baseline:  Goal status: ongoing 03/12/24  2.  Understand proper posture and body mechanics Baseline:  Goal status: ongoing 02/29/24, 04/19/24 Progressing   3.  Decrease pain 50% Baseline:  Goal status: progressing 03/07/24, Met 75% 03/21/24 827/25: no pain today  4.  Increase lumbar ROM 25% Baseline:  Goal status: met 02/29/24  5.  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  Able to shop for groceries Baseline: family members are currently doing this Goal status: ongoing 03/21/24 04/04/24:  ongoing, ongoing 04/19/24 Kids are doing it for her  PLAN:  PT FREQUENCY: 2x/week  PT DURATION: 12 weeks  PLANNED INTERVENTIONS: 97164- PT Re-evaluation, 97110-Therapeutic exercises, 97530- Therapeutic activity, 97112- Neuromuscular re-education, 97535- Self Care, 02859- Manual therapy, [id number]- Gait training, [phone]- Electrical stimulation (unattended), [id number]- Ultrasound, 02987- Traction (mechanical), [id number]- Ionotophoresis 4mg /ml Dexamethasone, Patient/Family education, Balance training, Stair training, Taping, Joint mobilization, Cryotherapy, and Moist heat.  PLAN FOR NEXT SESSION: see how she is doing , advance exercises as needed.    OBADIAH OZELL ORN, PT 04/23/2024, 1:58 PM

## 2024-04-25 ENCOUNTER — Ambulatory Visit: Admitting: Physical Therapy

## 2024-05-10 ENCOUNTER — Encounter: Payer: Self-pay | Admitting: Physical Therapy

## 2024-05-10 ENCOUNTER — Ambulatory Visit: Admitting: Physical Therapy

## 2024-05-10 DIAGNOSIS — M6281 Muscle weakness (generalized): Secondary | ICD-10-CM

## 2024-05-10 DIAGNOSIS — R2689 Other abnormalities of gait and mobility: Secondary | ICD-10-CM

## 2024-05-10 DIAGNOSIS — M5459 Other low back pain: Secondary | ICD-10-CM | POA: Diagnosis not present

## 2024-05-10 DIAGNOSIS — M62830 Muscle spasm of back: Secondary | ICD-10-CM

## 2024-05-10 NOTE — Therapy (Signed)
 OUTPATIENT PHYSICAL THERAPY THORACOLUMBAR TREATMENT    Patient Name: Sheila Hanson MRN: [id number] DOB:02-Jan-1934, 89 y.o., female Today's Date: 05/10/2024  END OF SESSION:  PT End of Session - 05/10/24 1025     Visit Number 18    Date for Recertification  05/11/24    PT Start Time 1020    PT Stop Time 1100    PT Time Calculation (min) 40 min    Activity Tolerance Patient tolerated treatment well    Behavior During Therapy [facility] for tasks assessed/performed             Past Medical History:  Diagnosis Date   Cirrhosis (HCC)    Colon adenoma 08/16/2005   Coronary artery disease    Diabetes mellitus without complication (HCC)    GERD (gastroesophageal reflux disease)    Humeral head fracture    Hypercholesteremia    Hypertension    Osteopenia    Positive PPD    Varicose veins    Wrist fracture    History reviewed. No pertinent surgical history. Patient Active Problem List   Diagnosis Date Noted   Pain due to onychomycosis of toenails of both feet 01/06/2022   Type 2 diabetes mellitus with vascular disease (HCC) 01/06/2022    PCP: Charlott, MD  REFERRING PROVIDER: [place], PA  REFERRING DIAG: right back and flank pain  Rationale for Evaluation and Treatment: Rehabilitation  THERAPY DIAG:  Other low back pain  Muscle weakness (generalized)  Muscle spasm of back  Other abnormalities of gait and mobility  ONSET DATE: 01/16/24  SUBJECTIVE:                                                                                                                                                                                           SUBJECTIVE STATEMENT: I don't feel so hot  Give out no pain just sore  Patient is a 89 y.o. female who was seen today for physical therapy evaluation and treatment for low back and right flank pain.  She reports that she did care for her husband who passed away in 08/03/24.  She reports that she wore her self out with keeping him  at home.  She does have some liver issues.  She does recall lifting about 45# and really caused a pulling  PERTINENT HISTORY:  See above  PAIN:  Are you having pain? Yes: NPRS scale: 0/10 Pain location: back  Pain description: Discomfort Aggravating factors: standing, bending, shopping pain will go up to 9-10/10 Relieving factors: warm shower at best pain a 3-4/10  PRECAUTIONS: None  RED FLAGS: None   WEIGHT BEARING RESTRICTIONS: No  FALLS:  Has patient fallen in last 6 months? No  LIVING ENVIRONMENT: Lives with: lives alone Lives in: House/apartment Stairs: has a ramp into the home, no stairs in the home Has following equipment at home: Single point cane and Shower bench  OCCUPATION: retired  PLOF: Independent walks daily with a SPC 15 minutes, was shopping and cleaning house, did some plant care  PATIENT GOALS: have less pain  NEXT MD VISIT: none  OBJECTIVE:  Note: Objective measures were completed at Evaluation unless otherwise noted.  DIAGNOSTIC FINDINGS:  No kidney stones  PATIENT SURVEYS:  ODI: 60%  COGNITION: Overall cognitive status: Within functional limits for tasks assessed     SENSATION: WFL  MUSCLE LENGTH: Mild tightness in the HS, calves and piriformis POSTURE: rounded shoulders, forward head, and decreased lumbar lordosis  PALPATION: She is very tight and tender in the low back and into the flank bilaterally  LUMBAR ROM:   AROM eval 03/22/23 05/10/24  Flexion Decreased 50% Limited 25% WFL  Extension 50% Limited 25% WFL  Right lateral flexion 50% WFL   Left lateral flexion 50% WFL   Right rotation 50% WFL   Left rotation      (Blank rows = not tested)  LOWER EXTREMITY MMT   all increase pain  Active  Right eval Left eval Right 04/19/24 Left 04/19/24  Hip flexion 4- 4- 4 4  Hip extension      Hip abduction      Hip adduction      Hip internal rotation      Hip external rotation      Knee flexion 4 4 4+ 5  Knee extension 4- 4- 4 4-   Ankle dorsiflexion      Ankle plantarflexion      Ankle inversion      Ankle eversion       (Blank rows = not tested)  LOWER EXTREMITY ROM:  WNL    LUMBAR SPECIAL TESTS:  Straight leg raise test: Positive and Slump test: Positive  FUNCTIONAL TESTS:  5 times sit to stand: 25 seconds Timed up and go (TUG): 22 seconds with a SPC 3 minute walk test: not tested  GAIT: Distance walked: 50 feet Assistive device utilized: Single point cane Level of assistance: Modified independence Comments: slow antalgic on the right, mild unsteadiness with turning  TREATMENT DATE:  05/10/24 NuStep L 5 x 6 min Shoulder Ext 5lb 2x10 6in step ups x10 Sit to stand holding yellow ball x10, no ball x10  04/23/24 Gait outside around the back building had to rest coming u hill, she was SOB. Nustep level 5 x 5 minutes On airex 5# straight arm pulls Side step on and off airex with CGA On airex ball toss 6 step ups with CGA LEg press 20# x 10, then single legs without weight x 8 each  04/19/24 NuStep L 5 x 7 min Goals Sit to standing holding blue ball 2x10 Rows & Lats 20lb 2x10 Shoulder Ext 5lb 2x10 6in step ups x5 each HS curls 20lb 2x10 Leg Ext 5lb 2x10  04/17/24 BP 126/77 NuStep L 5 x 6 min HS curls 20lb 2x10 Leg Ext 5lb 2x10 Seated Rows green 2x10 Shoulder Ext green 2x10  Hip add ball squeeze 2x10  04/11/24 Gait outdoors x 6 min over asphalt, inclines, curbs, needed CGA due to weaving Seated rows 20# 15 Seated hamstring curls 25# 10 x 2 Airex static standing feet together , no hands 30 sec Airex B heel raises,  at bar, x 15 reps Standing on airex for alt toe taps to cone Seated trunk stretch, hands on large physioball for forward stretch, side/side stretch  Sit to stand with forward punches with 3# cuff wt from hi low mat 10x   04/04/24:  Nustep Level 5 x 6 min Side lying on L for deep cross friciton massage R quadratus lumborum Therapist provided longitudinal distraction R lateral  lumbar, pelvis, with pt in L side lying with R hip and knee flexed over L Supine for lat pull overs with 3# cuff wt gripped B hands Supine with humerus vertical with post capsule stretch, with LTR opposite side to stretch posterior chain / trunk, 10 reps each side Seated rows 15# 15x Sit to stand with forward punches with 3# cuff wt from hi low mat 10x Seated forward, side to side stretches with B hands on large physioball Walking ball toss 100' Lat rows 15# 15x   04/02/24 Nustep level 5 x 6 minutes Gait outside down slope, up slope some uneven terrain walking negotiating curbs,  very fatigued, SOB and veered to the right multiple times, some CGA for curbs On airex ball toss On airex reaching On airex cone toe touches Walking ball toss Direction changes 20# HS curls    PATIENT EDUCATION:  Education details: HEP Person educated: Patient Education method: Programmer, multimedia, Facilities manager, Verbal cues, and Handouts Education comprehension: verbalized understanding  HOME EXERCISE PROGRAM: Access Code: [email or web address] URL: [email or web address] Date: 03/05/2024 Prepared by: Amy Speaks  Exercises - Seated Shoulder Shrugs  - 1 x daily - 7 x weekly - 2 sets - 10 reps - 3 hold - Seated Scapular Retraction  - 1 x daily - 7 x weekly - 2 sets - 10 reps - 3 hold - Supine Posterior Pelvic Tilt  - 1 x daily - 7 x weekly - 2 sets - 10 reps - 3 hold - Hooklying Single Knee to Chest Stretch  - 1 x daily - 7 x weekly - 2 sets - 10 reps - 5 hold - Supine Lower Trunk Rotation  - 1 x daily - 7 x weekly - 2 sets - 10 reps - 5 hold - Standing Sidebends  - 1 x daily - 7 x weekly - 3 sets - 10 reps - Sidelying Open Book Thoracic Lumbar Rotation and Extension  - 1 x daily - 7 x weekly - 3 sets - 10 reps Access Code: [email or web address] URL: [email or web address] Date: 02/09/2024 Prepared by: Ozell Mainland  Exercises - Seated Shoulder Shrugs  - 1 x daily - 7 x weekly - 2 sets - 10 reps - 3  hold - Seated Scapular Retraction  - 1 x daily - 7 x weekly - 2 sets - 10 reps - 3 hold - Supine Posterior Pelvic Tilt  - 1 x daily - 7 x weekly - 2 sets - 10 reps - 3 hold - Hooklying Single Knee to Chest Stretch  - 1 x daily - 7 x weekly - 2 sets - 10 reps - 5 hold - Supine Lower Trunk Rotation  - 1 x daily - 7 x weekly - 2 sets - 10 reps - 5 hold  ASSESSMENT:  CLINICAL IMPRESSION: Patient is a 89 y.o. female who participated today in physical therapy treatment for low back and right flank pain. Fortunately has mostly subsided. She enters reporting some fatigue.  Some shortness of breath with sit tos stands.  she has help with cleaning an groceries.  Interventions focused on postural and  LE strength. All interventions well.  Cues for full ROM needed with curls and extensions.  She should continue to benefit from skilled PT to address her pain as well as her overall stability.    OBJECTIVE IMPAIRMENTS: Abnormal gait, cardiopulmonary status limiting activity, decreased activity tolerance, decreased balance, decreased endurance, decreased mobility, difficulty walking, decreased ROM, decreased strength, increased fascial restrictions, increased muscle spasms, impaired flexibility, improper body mechanics, postural dysfunction, and pain.   ACTIVITY LIMITATIONS: carrying, lifting, bending, sitting, standing, sleeping, stairs, and locomotion level  PARTICIPATION LIMITATIONS: cleaning, shopping, community activity, and yard work  PERSONAL FACTORS: Age, Fitness, and Time since onset of injury/illness/exacerbation are also affecting patient's functional outcome.   REHAB POTENTIAL: Good  CLINICAL DECISION MAKING: Evolving/moderate complexity  EVALUATION COMPLEXITY: Moderate   GOALS: Goals reviewed with patient? Yes  SHORT TERM GOALS: Target date: 03/04/24  Independent with initial HEP Baseline: Goal status: met 02/29/24   LONG TERM GOALS: Target date: 05/11/24  Independent with advanced  HEP Baseline:  Goal status: ongoing 03/12/24  2.  Understand proper posture and body mechanics Baseline:  Goal status: ongoing 02/29/24, 04/19/24 Progressing, Met 05/10/24  3.  Decrease pain 50% Baseline:  Goal status: progressing 03/07/24, Met 75% 03/21/24 827/25: no pain today  4.  Increase lumbar ROM 25% Baseline:  Goal status: met 02/29/24  5.  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  Able to shop for groceries Baseline: family members are currently doing this Goal status: ongoing 03/21/24 04/04/24:  ongoing, ongoing 04/19/24 Kids are doing it for her  PLAN:  PT FREQUENCY: 2x/week  PT DURATION: 12 weeks  PLANNED INTERVENTIONS: 97164- PT Re-evaluation, 97110-Therapeutic exercises, 97530- Therapeutic activity, 97112- Neuromuscular re-education, 97535- Self Care, 02859- Manual therapy, [id number]- Gait training, [phone]- Electrical stimulation (unattended), [id number]- Ultrasound, 02987- Traction (mechanical), [id number]- Ionotophoresis 4mg /ml Dexamethasone, Patient/Family education, Balance training, Stair training, Taping, Joint mobilization, Cryotherapy, and Moist heat.  PLAN FOR NEXT SESSION: see how she is doing , advance exercises as needed.    Tanda KANDICE Sorrow, PTA 05/10/2024, 10:26 AM

## 2024-05-15 ENCOUNTER — Ambulatory Visit: Admitting: Physical Therapy

## 2024-05-15 DIAGNOSIS — M6281 Muscle weakness (generalized): Secondary | ICD-10-CM

## 2024-05-15 DIAGNOSIS — M62830 Muscle spasm of back: Secondary | ICD-10-CM | POA: Diagnosis not present

## 2024-05-15 DIAGNOSIS — M5459 Other low back pain: Secondary | ICD-10-CM | POA: Diagnosis not present

## 2024-05-15 DIAGNOSIS — R2689 Other abnormalities of gait and mobility: Secondary | ICD-10-CM | POA: Diagnosis not present

## 2024-05-15 NOTE — Therapy (Signed)
 OUTPATIENT PHYSICAL THERAPY THORACOLUMBAR TREATMENT    Patient Name: Sheila Hanson MRN: [id number] DOB:1934-03-31, 89 y.o., female Today's Date: 05/15/2024  END OF SESSION:  PT End of Session - 05/15/24 1103     Visit Number 19    Date for Recertification  05/11/24    PT Start Time 1103    PT Stop Time 1145    PT Time Calculation (min) 42 min    Activity Tolerance Patient tolerated treatment well    Behavior During Therapy [facility] for tasks assessed/performed             Past Medical History:  Diagnosis Date   Cirrhosis (HCC)    Colon adenoma 08/16/2005   Coronary artery disease    Diabetes mellitus without complication (HCC)    GERD (gastroesophageal reflux disease)    Humeral head fracture    Hypercholesteremia    Hypertension    Osteopenia    Positive PPD    Varicose veins    Wrist fracture    No past surgical history on file. Patient Active Problem List   Diagnosis Date Noted   Pain due to onychomycosis of toenails of both feet 01/06/2022   Type 2 diabetes mellitus with vascular disease (HCC) 01/06/2022    PCP: Charlott, MD  REFERRING PROVIDER: [place], PA  REFERRING DIAG: right back and flank pain  Rationale for Evaluation and Treatment: Rehabilitation  THERAPY DIAG:  Other low back pain  Muscle weakness (generalized)  Muscle spasm of back  Other abnormalities of gait and mobility  ONSET DATE: 01/16/24  SUBJECTIVE:                                                                                                                                                                                           SUBJECTIVE STATEMENT: Follow up visit Oct 13th, Im getting alone good.    Patient is a 89 y.o. female who was seen today for physical therapy evaluation and treatment for low back and right flank pain.  She reports that she did care for her husband who passed away in 07-19-2024.  She reports that she wore her self out with keeping him at home.  She  does have some liver issues.  She does recall lifting about 45# and really caused a pulling  PERTINENT HISTORY:  See above  PAIN:  Are you having pain? Yes: NPRS scale: 0/10 Pain location: back  Pain description: Discomfort Aggravating factors: standing, bending, shopping pain will go up to 9-10/10 Relieving factors: warm shower at best pain a 3-4/10  PRECAUTIONS: None  RED FLAGS: None   WEIGHT BEARING RESTRICTIONS: No  FALLS:  Has patient fallen in last 6 months? No  LIVING ENVIRONMENT: Lives with: lives alone Lives in: House/apartment Stairs: has a ramp into the home, no stairs in the home Has following equipment at home: Single point cane and Shower bench  OCCUPATION: retired  PLOF: Independent walks daily with a SPC 15 minutes, was shopping and cleaning house, did some plant care  PATIENT GOALS: have less pain  NEXT MD VISIT: none  OBJECTIVE:  Note: Objective measures were completed at Evaluation unless otherwise noted.  DIAGNOSTIC FINDINGS:  No kidney stones  PATIENT SURVEYS:  ODI: 60%  COGNITION: Overall cognitive status: Within functional limits for tasks assessed     SENSATION: WFL  MUSCLE LENGTH: Mild tightness in the HS, calves and piriformis POSTURE: rounded shoulders, forward head, and decreased lumbar lordosis  PALPATION: She is very tight and tender in the low back and into the flank bilaterally  LUMBAR ROM:   AROM eval 03/22/23 05/10/24  Flexion Decreased 50% Limited 25% WFL  Extension 50% Limited 25% WFL  Right lateral flexion 50% WFL   Left lateral flexion 50% WFL   Right rotation 50% WFL   Left rotation      (Blank rows = not tested)  LOWER EXTREMITY MMT   all increase pain  Active  Right eval Left eval Right 04/19/24 Left 04/19/24  Hip flexion 4- 4- 4 4  Hip extension      Hip abduction      Hip adduction      Hip internal rotation      Hip external rotation      Knee flexion 4 4 4+ 5  Knee extension 4- 4- 4 4-  Ankle  dorsiflexion      Ankle plantarflexion      Ankle inversion      Ankle eversion       (Blank rows = not tested)  LOWER EXTREMITY ROM:  WNL    LUMBAR SPECIAL TESTS:  Straight leg raise test: Positive and Slump test: Positive  FUNCTIONAL TESTS:  5 times sit to stand: 25 seconds Timed up and go (TUG): 22 seconds with a SPC 3 minute walk test: not tested  GAIT: Distance walked: 50 feet Assistive device utilized: Single point cane Level of assistance: Modified independence Comments: slow antalgic on the right, mild unsteadiness with turning  TREATMENT DATE:  05/15/24 NuStep L5 x 7 min 6in step ups x 10 each Shoulder Ext 5lb 2x10 HS curls 20lb 2x10   05/10/24 NuStep L 5 x 6 min Shoulder Ext 5lb 2x10 6in step ups x10 Sit to stand holding yellow ball x10, no ball x10  04/23/24 Gait outside around the back building had to rest coming u hill, she was SOB. Nustep level 5 x 5 minutes On airex 5# straight arm pulls Side step on and off airex with CGA On airex ball toss 6 step ups with CGA LEg press 20# x 10, then single legs without weight x 8 each  04/19/24 NuStep L 5 x 7 min Goals Sit to standing holding blue ball 2x10 Rows & Lats 20lb 2x10 Shoulder Ext 5lb 2x10 6in step ups x5 each HS curls 20lb 2x10 Leg Ext 5lb 2x10  04/17/24 BP 126/77 NuStep L 5 x 6 min HS curls 20lb 2x10 Leg Ext 5lb 2x10 Seated Rows green 2x10 Shoulder Ext green 2x10  Hip add ball squeeze 2x10  04/11/24 Gait outdoors x 6 min over asphalt, inclines, curbs, needed CGA due to weaving Seated rows 20# 15 Seated  hamstring curls 25# 10 x 2 Airex static standing feet together , no hands 30 sec Airex B heel raises, at bar, x 15 reps Standing on airex for alt toe taps to cone Seated trunk stretch, hands on large physioball for forward stretch, side/side stretch  Sit to stand with forward punches with 3# cuff wt from hi low mat 10x   04/04/24:  Nustep Level 5 x 6 min Side lying on L for deep cross  friciton massage R quadratus lumborum Therapist provided longitudinal distraction R lateral lumbar, pelvis, with pt in L side lying with R hip and knee flexed over L Supine for lat pull overs with 3# cuff wt gripped B hands Supine with humerus vertical with post capsule stretch, with LTR opposite side to stretch posterior chain / trunk, 10 reps each side Seated rows 15# 15x Sit to stand with forward punches with 3# cuff wt from hi low mat 10x Seated forward, side to side stretches with B hands on large physioball Walking ball toss 100' Lat rows 15# 15x   04/02/24 Nustep level 5 x 6 minutes Gait outside down slope, up slope some uneven terrain walking negotiating curbs,  very fatigued, SOB and veered to the right multiple times, some CGA for curbs On airex ball toss On airex reaching On airex cone toe touches Walking ball toss Direction changes 20# HS curls    PATIENT EDUCATION:  Education details: HEP Person educated: Patient Education method: Programmer, multimedia, Facilities manager, Verbal cues, and Handouts Education comprehension: verbalized understanding  HOME EXERCISE PROGRAM: Access Code: [email or web address] URL: [email or web address] Date: 03/05/2024 Prepared by: Amy Speaks  Exercises - Seated Shoulder Shrugs  - 1 x daily - 7 x weekly - 2 sets - 10 reps - 3 hold - Seated Scapular Retraction  - 1 x daily - 7 x weekly - 2 sets - 10 reps - 3 hold - Supine Posterior Pelvic Tilt  - 1 x daily - 7 x weekly - 2 sets - 10 reps - 3 hold - Hooklying Single Knee to Chest Stretch  - 1 x daily - 7 x weekly - 2 sets - 10 reps - 5 hold - Supine Lower Trunk Rotation  - 1 x daily - 7 x weekly - 2 sets - 10 reps - 5 hold - Standing Sidebends  - 1 x daily - 7 x weekly - 3 sets - 10 reps - Sidelying Open Book Thoracic Lumbar Rotation and Extension  - 1 x daily - 7 x weekly - 3 sets - 10 reps Access Code: [email or web address] URL: [email or web address] Date: 02/09/2024 Prepared by: Ozell Mainland  Exercises - Seated Shoulder Shrugs  - 1 x daily - 7 x weekly - 2 sets - 10 reps - 3 hold - Seated Scapular Retraction  - 1 x daily - 7 x weekly - 2 sets - 10 reps - 3 hold - Supine Posterior Pelvic Tilt  - 1 x daily - 7 x weekly - 2 sets - 10 reps - 3 hold - Hooklying Single Knee to Chest Stretch  - 1 x daily - 7 x weekly - 2 sets - 10 reps - 5 hold - Supine Lower Trunk Rotation  - 1 x daily - 7 x weekly - 2 sets - 10 reps - 5 hold  ASSESSMENT:  CLINICAL IMPRESSION: Patient is a 89 y.o. female who participated today in physical therapy treatment for low back and right flank pain. Fortunately has mostly subsided. She does report that she doe have family  support. She is pleased with her current functional status. Most goals met. No issues during today s ssion.   OBJECTIVE IMPAIRMENTS: Abnormal gait, cardiopulmonary status limiting activity, decreased activity tolerance, decreased balance, decreased endurance, decreased mobility, difficulty walking, decreased ROM, decreased strength, increased fascial restrictions, increased muscle spasms, impaired flexibility, improper body mechanics, postural dysfunction, and pain.   ACTIVITY LIMITATIONS: carrying, lifting, bending, sitting, standing, sleeping, stairs, and locomotion level  PARTICIPATION LIMITATIONS: cleaning, shopping, community activity, and yard work  PERSONAL FACTORS: Age, Fitness, and Time since onset of injury/illness/exacerbation are also affecting patient's functional outcome.   REHAB POTENTIAL: Good  CLINICAL DECISION MAKING: Evolving/moderate complexity  EVALUATION COMPLEXITY: Moderate   GOALS: Goals reviewed with patient? Yes  SHORT TERM GOALS: Target date: 03/04/24  Independent with initial HEP Baseline: Goal status: met 02/29/24   LONG TERM GOALS: Target date: 05/11/24  Independent with advanced HEP Baseline:  Goal status: ongoing 03/12/24  2.  Understand proper posture and body mechanics Baseline:   Goal status: ongoing 02/29/24, 04/19/24 Progressing, Met 05/10/24  3.  Decrease pain 50% Baseline:  Goal status: progressing 03/07/24, Met 75% 03/21/24 827/25: no pain today  4.  Increase lumbar ROM 25% Baseline:  Goal status: met 02/29/24  5.  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  Able to shop for groceries Baseline: family members are currently doing this Goal status: ongoing 03/21/24 04/04/24:  ongoing, ongoing 04/19/24 Kids are doing it for her  PLAN:  PT FREQUENCY: 2x/week  PT DURATION: 12 weeks  PLANNED INTERVENTIONS: [id number]- PT Re-evaluation, 97110-Therapeutic exercises, 97530- Therapeutic activity, 97112- Neuromuscular re-education, 97535- Self Care, 02859- Manual therapy, [id number]- Gait training, [id number]- Electrical stimulation (unattended), [id number]- Ultrasound, 02987- Traction (mechanical), [id number]- Ionotophoresis 4mg /ml Dexamethasone, Patient/Family education, Balance training, Stair training, Taping, Joint mobilization, Cryotherapy, and Moist heat.  PLAN FOR NEXT SESSION: D/C PT  PHYSICAL THERAPY DISCHARGE SUMMARY  Visits from Start of Care: 19   Patient agrees to discharge. Patient goals were partially met. Patient is being discharged due to being pleased with the current functional level.   Tanda KANDICE Sorrow, PTA 05/15/2024, 11:06 AM

## 2024-05-24 ENCOUNTER — Encounter: Payer: Self-pay | Admitting: Podiatry

## 2024-05-24 ENCOUNTER — Ambulatory Visit: Admitting: Podiatry

## 2024-05-24 DIAGNOSIS — M79674 Pain in right toe(s): Secondary | ICD-10-CM

## 2024-05-24 DIAGNOSIS — B351 Tinea unguium: Secondary | ICD-10-CM

## 2024-05-24 DIAGNOSIS — M79675 Pain in left toe(s): Secondary | ICD-10-CM

## 2024-05-24 DIAGNOSIS — E1159 Type 2 diabetes mellitus with other circulatory complications: Secondary | ICD-10-CM

## 2024-05-24 NOTE — Progress Notes (Signed)
 This patient returns to my office for at risk foot care.  This patient requires this care by a professional since this patient will be at risk due to having  diabetes.  This patient is unable to cut nails herself since the patient cannot reach her nails.These nails are painful walking and wearing shoes.  This patient presents for at risk foot care today.  General Appearance  Alert, conversant and in no acute stress.  Vascular  Dorsalis pedis and posterior tibial  pulses are  weakly palpable B/L Capillary return is within normal limits  bilaterally. Temperature is within normal limits  bilaterally.  Neurologic  Senn-Weinstein monofilament wire test within normal limits  bilaterally. Muscle power within normal limits bilaterally.  Nails Thick disfigured discolored nails with subungual debris  from hallux to fifth toes bilaterally. No evidence of bacterial infection or drainage bilaterally.  Orthopedic  No limitations of motion  feet .  No crepitus or effusions noted.  No bony pathology or digital deformities noted.  Skin  normotropic skin with no porokeratosis noted bilaterally.  No signs of infections or ulcers noted.     Onychomycosis  Pain in right toes  Pain in left toes  Consent was obtained for treatment procedures.   Mechanical debridement of nails 1-5  bilaterally performed with a nail nipper.  Filed with dremel without incident.    Return office visit   3 months                   Told patient to return for periodic foot care and evaluation due to potential at risk complications.   Helane Gunther DPM

## 2024-05-28 DIAGNOSIS — R109 Unspecified abdominal pain: Secondary | ICD-10-CM | POA: Diagnosis not present

## 2024-05-28 DIAGNOSIS — E1140 Type 2 diabetes mellitus with diabetic neuropathy, unspecified: Secondary | ICD-10-CM | POA: Diagnosis not present

## 2024-05-28 DIAGNOSIS — K7469 Other cirrhosis of liver: Secondary | ICD-10-CM | POA: Diagnosis not present

## 2024-05-28 DIAGNOSIS — E1165 Type 2 diabetes mellitus with hyperglycemia: Secondary | ICD-10-CM | POA: Diagnosis not present

## 2024-05-28 DIAGNOSIS — Z23 Encounter for immunization: Secondary | ICD-10-CM | POA: Diagnosis not present

## 2024-05-28 DIAGNOSIS — R2689 Other abnormalities of gait and mobility: Secondary | ICD-10-CM | POA: Diagnosis not present

## 2024-06-07 DIAGNOSIS — H35033 Hypertensive retinopathy, bilateral: Secondary | ICD-10-CM | POA: Diagnosis not present

## 2024-06-07 DIAGNOSIS — H353221 Exudative age-related macular degeneration, left eye, with active choroidal neovascularization: Secondary | ICD-10-CM | POA: Diagnosis not present

## 2024-06-07 DIAGNOSIS — H353112 Nonexudative age-related macular degeneration, right eye, intermediate dry stage: Secondary | ICD-10-CM | POA: Diagnosis not present

## 2024-06-07 DIAGNOSIS — H43813 Vitreous degeneration, bilateral: Secondary | ICD-10-CM | POA: Diagnosis not present

## 2024-06-07 DIAGNOSIS — H43391 Other vitreous opacities, right eye: Secondary | ICD-10-CM | POA: Diagnosis not present

## 2024-06-20 ENCOUNTER — Ambulatory Visit

## 2024-06-28 ENCOUNTER — Other Ambulatory Visit: Payer: Self-pay | Admitting: Gastroenterology

## 2024-06-28 DIAGNOSIS — K7460 Unspecified cirrhosis of liver: Secondary | ICD-10-CM

## 2024-07-11 ENCOUNTER — Other Ambulatory Visit: Payer: Self-pay

## 2024-07-11 ENCOUNTER — Ambulatory Visit: Attending: Internal Medicine

## 2024-07-11 DIAGNOSIS — R262 Difficulty in walking, not elsewhere classified: Secondary | ICD-10-CM | POA: Diagnosis not present

## 2024-07-11 NOTE — Therapy (Signed)
 OUTPATIENT PHYSICAL THERAPY LOWER EXTREMITY EVALUATION   Patient Name: Sheila Hanson MRN: [id number] DOB:1933/09/10, 89 y.o., female Today's Date: 07/11/2024  END OF SESSION:  PT End of Session - 07/11/24 1350     Visit Number 1    Date for Recertification  10/03/24    Progress Note Due on Visit 10    PT Start Time 1349    PT Stop Time 1430    PT Time Calculation (min) 41 min    Activity Tolerance Patient tolerated treatment well    Behavior During Therapy [facility] for tasks assessed/performed          Past Medical History:  Diagnosis Date   Cirrhosis (HCC)    Colon adenoma 08/16/2005   Coronary artery disease    Diabetes mellitus without complication (HCC)    GERD (gastroesophageal reflux disease)    Humeral head fracture    Hypercholesteremia    Hypertension    Osteopenia    Positive PPD    Varicose veins    Wrist fracture    History reviewed. No pertinent surgical history. Patient Active Problem List   Diagnosis Date Noted   Pain due to onychomycosis of toenails of both feet 01/06/2022   Type 2 diabetes mellitus with vascular disease (HCC) 01/06/2022    PCP: Charlott Carrier, MD  REFERRING PROVIDER: same  REFERRING DIAG: LE and balance training  THERAPY DIAG:  Difficulty in walking, not elsewhere classified - Plan: PT plan of care cert/re-cert  Rationale for Evaluation and Treatment: Rehabilitation  ONSET DATE: 2 months ago   SUBJECTIVE:   SUBJECTIVE STATEMENT: My doctor was concerned about my balance so he sent me back. I have a hard time getting up from a chair without arms.  I'm trying to avoid going outdoors  PERTINENT HISTORY: H/o generalized weakness with underlying dx of non alcoholic cirrhosis PAIN:  Are you having pain? No  PRECAUTIONS: Fall  RED FLAGS: None   WEIGHT BEARING RESTRICTIONS: No  FALLS:  Has patient fallen in last 6 months? No  LIVING ENVIRONMENT: Lives with: lives alone Lives in: House/apartment Stairs: ramp  outdoors Has following equipment at home: Single point cane  OCCUPATION: retired  PLOF: Independent  PATIENT GOALS: get ore steady, get stronger  NEXT MD VISIT: 3 months  OBJECTIVE:  Note: Objective measures were completed at Evaluation unless otherwise noted.  DIAGNOSTIC FINDINGS: na  PATIENT SURVEYS:  PSFS: THE PATIENT SPECIFIC FUNCTIONAL SCALE  Place score of 0-10 (0 = unable to perform activity and 10 = able to perform activity at the same level as before injury or problem)  Activity Date: 07/11/24    Walking outdoors  3    2. Sit to stand from armless chair 5    3. Using the outdoor steps with rail 4    4. Shower transfers 6    Total Score 4.5     18 Total Score = Sum of activity scores/number of activities  Minimally Detectable Change: 3 points (for single activity); 2 points (for average score)  [employer] [facility] [facility] (nd). The Patient Specific Functional Scale . Retrieved from Skateoasis.com.pt **  COGNITION: Overall cognitive status: Within functional limits for tasks assessed     SENSATION: WFL, except reports R lateral thigh numbness  EDEMA:  Wearing knee high compression stockings has hired assistance one hour a day so her helper assists her with donning the stockings  POSTURE: wide base of support    LOWER EXTREMITY ROM:wfl  B    LOWER  EXTREMITY MMT:  MMT Right eval Left eval  Hip flexion    Hip extension    Hip abduction    Hip adduction    Hip internal rotation    Hip external rotation    Knee flexion 4 4  Knee extension 5 4/5  Ankle dorsiflexion 5 5  Ankle plantarflexion 3+ 3+  Ankle inversion    Ankle eversion     (Blank rows = not tested)  FUNCTIONAL TESTS:  5 times sit to stand: 14.56 with arms  BERG 24/56 GAIT: Distance walked: 300' Assistive device utilized: None Level of assistance: SBA Walked 2 min 50 sec, then fatigued, occasional weaving using walls, door frames  to correct                                                                                                                                TREATMENT DATE: 07/11/24:   Evaluation, education regarding Berg score    PATIENT EDUCATION:  Education details: POC, goals Person educated: Patient Education method: Medical Illustrator Education comprehension: verbalized understanding  HOME EXERCISE PROGRAM: TBD  ASSESSMENT:  CLINICAL IMPRESSION: Patient is a 89 y.o. female who was evaluated today by physical therapy due to more instability and LE weakness , referred by her PCP after office check up.  She does present as high fall risk with Berg score 24/56 and weakness B hips and thigh musculature. Should benefit from supervised strengthening and balance activities in a safe setting with phyiscal therapy.  OBJECTIVE IMPAIRMENTS: cardiopulmonary status limiting activity, decreased activity tolerance, decreased balance, decreased endurance, difficulty walking, decreased strength, and improper body mechanics.   ACTIVITY LIMITATIONS: carrying, lifting, bending, standing, squatting, stairs, transfers, and locomotion level  PARTICIPATION LIMITATIONS: meal prep, cleaning, laundry, shopping, community activity, and yard work  PERSONAL FACTORS: Age, Behavior pattern, Fitness, Time since onset of injury/illness/exacerbation, and 1-2 comorbidities: non alcoholic cirrhosis, DM are also affecting patient's functional outcome.   REHAB POTENTIAL: Good  CLINICAL DECISION MAKING: Evolving/moderate complexity  EVALUATION COMPLEXITY: High   GOALS: Goals reviewed with patient? Yes  SHORT TERM GOALS: Target date: 2 weeks 07/25/24 I HEP Baseline: Goal status: INITIAL   LONG TERM GOALS: Target date: 10/03/24 12 weeks   Improve Berg from 24/56 to 4 /56 or greater Baseline:  Goal status: INITIAL  2.  Able to move 5 x sit to stand without arms from standard chair 11 reps or greater Baseline:  reliant on arms Goal status: INITIAL  3.  Improve PSFS from 4.5 to 7.5 or greater Baseline:  Goal status: INITIAL  4.  Able to walk 800' without fatigue Baseline: 300' Goal status: INITIAL    PLAN:  PT FREQUENCY: 2x/week  PT DURATION: 12 weeks  PLANNED INTERVENTIONS: 97110-Therapeutic exercises, 97530- Therapeutic activity, [id number]- Neuromuscular re-education, 97535- Self Care, 02859- Manual therapy, and Patient/Family education  PLAN FOR NEXT SESSION: cardiovascular training, bulk strengthening for LE's    Alberto Pina L Bela Nyborg, PT, DPT,  OCS 07/11/2024, 3:33 PM

## 2024-07-20 ENCOUNTER — Ambulatory Visit: Admitting: Physical Therapy

## 2024-07-30 ENCOUNTER — Encounter: Payer: Self-pay | Admitting: Gastroenterology

## 2024-07-31 ENCOUNTER — Encounter: Payer: Self-pay | Admitting: Physical Therapy

## 2024-07-31 ENCOUNTER — Ambulatory Visit: Attending: Internal Medicine | Admitting: Physical Therapy

## 2024-07-31 DIAGNOSIS — M5459 Other low back pain: Secondary | ICD-10-CM | POA: Insufficient documentation

## 2024-07-31 DIAGNOSIS — M6281 Muscle weakness (generalized): Secondary | ICD-10-CM

## 2024-07-31 DIAGNOSIS — R262 Difficulty in walking, not elsewhere classified: Secondary | ICD-10-CM

## 2024-07-31 DIAGNOSIS — R2689 Other abnormalities of gait and mobility: Secondary | ICD-10-CM | POA: Diagnosis present

## 2024-07-31 NOTE — Therapy (Signed)
 OUTPATIENT PHYSICAL THERAPY LOWER EXTREMITY TREATMENT   Patient Name: Sheila Hanson MRN: [id number] DOB:08-09-1934, 89 y.o., female Today's Date: 07/31/2024  END OF SESSION:  PT End of Session - 07/31/24 1432     Visit Number 2    Date for Recertification  10/03/24    PT Start Time 1433    PT Stop Time 1515    PT Time Calculation (min) 42 min    Activity Tolerance Patient tolerated treatment well    Behavior During Therapy [facility] for tasks assessed/performed          Past Medical History:  Diagnosis Date   Cirrhosis (HCC)    Colon adenoma 08/16/2005   Coronary artery disease    Diabetes mellitus without complication (HCC)    GERD (gastroesophageal reflux disease)    Humeral head fracture    Hypercholesteremia    Hypertension    Osteopenia    Positive PPD    Varicose veins    Wrist fracture    History reviewed. No pertinent surgical history. Patient Active Problem List   Diagnosis Date Noted   Pain due to onychomycosis of toenails of both feet 01/06/2022   Type 2 diabetes mellitus with vascular disease (HCC) 01/06/2022    PCP: Charlott Carrier, MD  REFERRING PROVIDER: same  REFERRING DIAG: LE and balance training  THERAPY DIAG:  Difficulty in walking, not elsewhere classified  Other low back pain  Muscle weakness (generalized)  Rationale for Evaluation and Treatment: Rehabilitation  ONSET DATE: 2 months ago   SUBJECTIVE:   SUBJECTIVE STATEMENT: Poorly Im old, just can't get all together   PERTINENT HISTORY: H/o generalized weakness with underlying dx of non alcoholic cirrhosis PAIN:  Are you having pain? No  PRECAUTIONS: Fall  RED FLAGS: None   WEIGHT BEARING RESTRICTIONS: No  FALLS:  Has patient fallen in last 6 months? No  LIVING ENVIRONMENT: Lives with: lives alone Lives in: House/apartment Stairs: ramp outdoors Has following equipment at home: Single point cane  OCCUPATION: retired  PLOF: Independent  PATIENT GOALS:  get ore steady, get stronger  NEXT MD VISIT: 3 months  OBJECTIVE:  Note: Objective measures were completed at Evaluation unless otherwise noted.  DIAGNOSTIC FINDINGS: na  PATIENT SURVEYS:  PSFS: THE PATIENT SPECIFIC FUNCTIONAL SCALE  Place score of 0-10 (0 = unable to perform activity and 10 = able to perform activity at the same level as before injury or problem)  Activity Date: 07/11/24    Walking outdoors  3    2. Sit to stand from armless chair 5    3. Using the outdoor steps with rail 4    4. Shower transfers 6    Total Score 4.5     18 Total Score = Sum of activity scores/number of activities  Minimally Detectable Change: 3 points (for single activity); 2 points (for average score)  [employer] [facility] (nd). The Patient Specific Functional Scale . Retrieved from [employer].com.pt **  COGNITION: Overall cognitive status: Within functional limits for tasks assessed     SENSATION: WFL, except reports R lateral thigh numbness  EDEMA:  Wearing knee high compression stockings has hired assistance one hour a day so her helper assists her with donning the stockings  POSTURE: wide base of support    LOWER EXTREMITY ROM:wfl  B    LOWER EXTREMITY MMT:  MMT Right eval Left eval  Hip flexion    Hip extension    Hip abduction    Hip adduction  Hip internal rotation    Hip external rotation    Knee flexion 4 4  Knee extension 5 4/5  Ankle dorsiflexion 5 5  Ankle plantarflexion 3+ 3+  Ankle inversion    Ankle eversion     (Blank rows = not tested)  FUNCTIONAL TESTS:  5 times sit to stand: 14.56 with arms  BERG 24/56 GAIT: Distance walked: 300' Assistive device utilized: None Level of assistance: SBA Walked 2 min 50 sec, then fatigued, occasional weaving using walls, door frames to correct                                                                                                                                 TREATMENT DATE:  07/31/24 NuStep L 3 x 7 min Sit to stands 2x10 HS curls 20lb 2x10 Leg Ext 5lb 2x10 Hip abd blue 2x10 Hip add ball squeeze  07/11/24:   Evaluation, education regarding Berg score    PATIENT EDUCATION:  Education details: POC, goals Person educated: Patient Education method: Medical Illustrator Education comprehension: verbalized understanding  HOME EXERCISE PROGRAM: TBD  ASSESSMENT:  CLINICAL IMPRESSION: Patient is a 89 y.o. female who was treated today by physical therapy due to more instability and LE weakness. Pt tolerated an initial progression to Le strength well. Increase time needed between interventions so pt could rest and recover. Pt doe fatigue quick after activity. Cue for full Ron needed with LE curs and extensions. Should benefit from supervised strengthening and balance activities in a safe setting with phyiscal therapy.  OBJECTIVE IMPAIRMENTS: cardiopulmonary status limiting activity, decreased activity tolerance, decreased balance, decreased endurance, difficulty walking, decreased strength, and improper body mechanics.   ACTIVITY LIMITATIONS: carrying, lifting, bending, standing, squatting, stairs, transfers, and locomotion level  PARTICIPATION LIMITATIONS: meal prep, cleaning, laundry, shopping, community activity, and yard work  PERSONAL FACTORS: Age, Behavior pattern, Fitness, Time since onset of injury/illness/exacerbation, and 1-2 comorbidities: non alcoholic cirrhosis, DM are also affecting patient's functional outcome.   REHAB POTENTIAL: Good  CLINICAL DECISION MAKING: Evolving/moderate complexity  EVALUATION COMPLEXITY: High   GOALS: Goals reviewed with patient? Yes  SHORT TERM GOALS: Target date: 2 weeks 07/25/24 I HEP Baseline: Goal status: INITIAL   LONG TERM GOALS: Target date: 10/03/24 12 weeks   Improve Berg from 24/56 to 4 /56 or greater Baseline:  Goal status: INITIAL  2.  Able to  move 5 x sit to stand without arms from standard chair 11 reps or greater Baseline: reliant on arms Goal status: INITIAL  3.  Improve PSFS from 4.5 to 7.5 or greater Baseline:  Goal status: INITIAL  4.  Able to walk 800' without fatigue Baseline: 300' Goal status: INITIAL    PLAN:  PT FREQUENCY: 2x/week  PT DURATION: 12 weeks  PLANNED INTERVENTIONS: 97110-Therapeutic exercises, 97530- Therapeutic activity, [id number]- Neuromuscular re-education, 97535- Self Care, 02859- Manual therapy, and Patient/Family education  PLAN FOR NEXT SESSION: cardiovascular training, bulk strengthening for LE's  Tanda KANDICE Sorrow, PTA,  07/31/2024, 2:33 PM

## 2024-08-02 ENCOUNTER — Ambulatory Visit: Admitting: Physical Therapy

## 2024-08-03 ENCOUNTER — Ambulatory Visit: Admitting: Podiatry

## 2024-08-04 ENCOUNTER — Inpatient Hospital Stay: Admission: RE | Admit: 2024-08-04 | Discharge: 2024-08-04 | Attending: Gastroenterology

## 2024-08-04 DIAGNOSIS — K7460 Unspecified cirrhosis of liver: Secondary | ICD-10-CM

## 2024-08-04 MED ORDER — GADOPICLENOL 0.5 MMOL/ML IV SOLN
6.0000 mL | Freq: Once | INTRAVENOUS | Status: AC | PRN
  Administered 2024-08-04: 6 mL via INTRAVENOUS

## 2024-08-13 ENCOUNTER — Ambulatory Visit

## 2024-08-15 ENCOUNTER — Ambulatory Visit

## 2024-08-15 ENCOUNTER — Other Ambulatory Visit: Payer: Self-pay

## 2024-08-15 DIAGNOSIS — M6281 Muscle weakness (generalized): Secondary | ICD-10-CM

## 2024-08-15 DIAGNOSIS — R262 Difficulty in walking, not elsewhere classified: Secondary | ICD-10-CM | POA: Diagnosis not present

## 2024-08-15 DIAGNOSIS — R2689 Other abnormalities of gait and mobility: Secondary | ICD-10-CM

## 2024-08-15 NOTE — Therapy (Signed)
 " OUTPATIENT PHYSICAL THERAPY LOWER EXTREMITY TREATMENT   Patient Name: Sheila Hanson MRN: [id number] DOB:Sep 14, 1933, 89 y.o., female Today's Date: 08/15/2024  END OF SESSION:  PT End of Session - 08/15/24 1142     Visit Number 3    Date for Recertification  10/03/24    Progress Note Due on Visit 10    PT Start Time 1142    PT Stop Time 1225    PT Time Calculation (min) 43 min    Activity Tolerance Patient tolerated treatment well    Behavior During Therapy [facility] for tasks assessed/performed           Past Medical History:  Diagnosis Date   Cirrhosis (HCC)    Colon adenoma 08/16/2005   Coronary artery disease    Diabetes mellitus without complication (HCC)    GERD (gastroesophageal reflux disease)    Humeral head fracture    Hypercholesteremia    Hypertension    Osteopenia    Positive PPD    Varicose veins    Wrist fracture    No past surgical history on file. Patient Active Problem List   Diagnosis Date Noted   Pain due to onychomycosis of toenails of both feet 01/06/2022   Type 2 diabetes mellitus with vascular disease (HCC) 01/06/2022    PCP: Charlott Carrier, MD  REFERRING PROVIDER: same  REFERRING DIAG: LE and balance training  THERAPY DIAG:  Difficulty in walking, not elsewhere classified  Muscle weakness (generalized)  Other abnormalities of gait and mobility  Rationale for Evaluation and Treatment: Rehabilitation  ONSET DATE: 2 months ago   SUBJECTIVE:   SUBJECTIVE STATEMENT: I'm getting short of breath in the morning when I first get up    PERTINENT HISTORY: H/o generalized weakness with underlying dx of non alcoholic cirrhosis PAIN:  Are you having pain? No  PRECAUTIONS: Fall  RED FLAGS: None   WEIGHT BEARING RESTRICTIONS: No  FALLS:  Has patient fallen in last 6 months? No  LIVING ENVIRONMENT: Lives with: lives alone Lives in: House/apartment Stairs: ramp outdoors Has following equipment at home: Single point  cane  OCCUPATION: retired  PLOF: Independent  PATIENT GOALS: get ore steady, get stronger  NEXT MD VISIT: 3 months  OBJECTIVE:  Note: Objective measures were completed at Evaluation unless otherwise noted.  DIAGNOSTIC FINDINGS: na  PATIENT SURVEYS:  PSFS: THE PATIENT SPECIFIC FUNCTIONAL SCALE  Place score of 0-10 (0 = unable to perform activity and 10 = able to perform activity at the same level as before injury or problem)  Activity Date: 07/11/24    Walking outdoors  3    2. Sit to stand from armless chair 5    3. Using the outdoor steps with rail 4    4. Shower transfers 6    Total Score 4.5     18 Total Score = Sum of activity scores/number of activities  Minimally Detectable Change: 3 points (for single activity); 2 points (for average score)  [facility] [facility] (nd). The Patient Specific Functional Scale . Retrieved from [employer]ateo[employer].com.pt **  COGNITION: Overall cognitive status: Within functional limits for tasks assessed     SENSATION: WFL, except reports R lateral thigh numbness  EDEMA:  Wearing knee high compression stockings has hired assistance one hour a day so her helper assists her with donning the stockings  POSTURE: wide base of support    LOWER EXTREMITY ROM:wfl  B    LOWER EXTREMITY MMT:  MMT Right eval Left eval  Hip  flexion    Hip extension    Hip abduction    Hip adduction    Hip internal rotation    Hip external rotation    Knee flexion 4 4  Knee extension 5 4/5  Ankle dorsiflexion 5 5  Ankle plantarflexion 3+ 3+  Ankle inversion    Ankle eversion     (Blank rows = not tested)  FUNCTIONAL TESTS:  5 times sit to stand: 14.56 with arms  BERG 24/56 GAIT: Distance walked: 300' Assistive device utilized: None Level of assistance: SBA Walked 2 min 50 sec, then fatigued, occasional weaving using walls, door frames to correct                                                                                                                                 TREATMENT DATE:  08/15/24:  B knee ext 5#, 2 x 10 B knee flex 20#, 2 x 10 Nustep L 4 x 7 min  Standing in ll bars for heel toe rocks standing on airex Standing on airex feet shoulder width, horizontal head turns Hip adduction ball squeezes  5 sec holds, 10x Sit to stand from hi low table with table elevated 5 x 2 , hands on thighs    07/31/24 NuStep L 3 x 7 min Sit to stands 2x10 HS curls 20lb 2x10 Leg Ext 5lb 2x10 Hip abd blue 2x10 Hip add ball squeeze  07/11/24:   Evaluation, education regarding Berg score    PATIENT EDUCATION:  Education details: POC, goals Person educated: Patient Education method: Medical Illustrator Education comprehension: verbalized understanding  HOME EXERCISE PROGRAM: TBD  ASSESSMENT:  CLINICAL IMPRESSION: Patient is a 89 y.o. female who was treated today by physical therapy due to more instability and LE weakness. Pt very weak, required frequent rest periods.  She reports not eating much.  No shortness of breath noticed today but she did report recent shortness of breath upon awakening which she will discuss with her MD.  Her pulse oxygen was 100%.   Should benefit from supervised strengthening and balance activities in a safe setting with phyiscal therapy.  OBJECTIVE IMPAIRMENTS: cardiopulmonary status limiting activity, decreased activity tolerance, decreased balance, decreased endurance, difficulty walking, decreased strength, and improper body mechanics.   ACTIVITY LIMITATIONS: carrying, lifting, bending, standing, squatting, stairs, transfers, and locomotion level  PARTICIPATION LIMITATIONS: meal prep, cleaning, laundry, shopping, community activity, and yard work  PERSONAL FACTORS: Age, Behavior pattern, Fitness, Time since onset of injury/illness/exacerbation, and 1-2 comorbidities: non alcoholic cirrhosis, DM are also affecting patient's  functional outcome.   REHAB POTENTIAL: Good  CLINICAL DECISION MAKING: Evolving/moderate complexity  EVALUATION COMPLEXITY: High   GOALS: Goals reviewed with patient? Yes  SHORT TERM GOALS: Target date: 2 weeks 07/25/24 I HEP Baseline: Goal status: INITIAL   LONG TERM GOALS: Target date: 10/03/24 12 weeks   Improve Berg from 24/56 to 4 /56 or greater Baseline:  Goal status: INITIAL  2.  Able to move 5 x sit to stand without arms from standard chair 11 reps or greater Baseline: reliant on arms Goal status: INITIAL  3.  Improve PSFS from 4.5 to 7.5 or greater Baseline:  Goal status: INITIAL  4.  Able to walk 800' without fatigue Baseline: 300' Goal status: INITIAL    PLAN:  PT FREQUENCY: 2x/week  PT DURATION: 12 weeks  PLANNED INTERVENTIONS: 97110-Therapeutic exercises, 97530- Therapeutic activity, 97112- Neuromuscular re-education, [phone]- Self Care, [id number]- Manual therapy, and Patient/Family education  PLAN FOR NEXT SESSION: cardiovascular training, bulk strengthening for LE's    Debroah Shuttleworth LITTIE Credit, PT,  08/15/2024, 12:28 PM [facility] at [facility] [address] [facility]. [place], [place], [address] Phone: [phone]   Fax:  [phone]  Patient Details  Name: BRITTANEY BEAULIEU MRN: [id number] Date of Birth: 1934-05-18 Referring Provider:  Charlott Dorn LABOR, MD  Encounter Date: 08/15/2024   Greig LITTIE Credit, PT 08/15/2024, 12:28 PM  [facility] [address] [facility]. [place], [place], [address] Phone: [phone]   Fax:  [phone] "

## 2024-08-17 ENCOUNTER — Encounter: Payer: Self-pay | Admitting: Physical Therapy

## 2024-08-17 ENCOUNTER — Ambulatory Visit: Attending: Internal Medicine | Admitting: Physical Therapy

## 2024-08-17 DIAGNOSIS — R262 Difficulty in walking, not elsewhere classified: Secondary | ICD-10-CM | POA: Diagnosis present

## 2024-08-17 DIAGNOSIS — M6281 Muscle weakness (generalized): Secondary | ICD-10-CM | POA: Insufficient documentation

## 2024-08-17 DIAGNOSIS — R2689 Other abnormalities of gait and mobility: Secondary | ICD-10-CM | POA: Diagnosis present

## 2024-08-17 DIAGNOSIS — M5459 Other low back pain: Secondary | ICD-10-CM | POA: Diagnosis present

## 2024-08-17 DIAGNOSIS — M62830 Muscle spasm of back: Secondary | ICD-10-CM | POA: Insufficient documentation

## 2024-08-17 NOTE — Therapy (Signed)
 " OUTPATIENT PHYSICAL THERAPY LOWER EXTREMITY TREATMENT   Patient Name: Sheila Hanson MRN: [id number] DOB:02/25/1934, 89 y.o., female Today's Date: 08/17/2024  END OF SESSION:  PT End of Session - 08/17/24 1013     Visit Number 4    Date for Recertification  10/03/24    PT Start Time 1015    PT Stop Time 1100    PT Time Calculation (min) 45 min    Activity Tolerance Patient tolerated treatment well    Behavior During Therapy [facility] for tasks assessed/performed           Past Medical History:  Diagnosis Date   Cirrhosis (HCC)    Colon adenoma 08/16/2005   Coronary artery disease    Diabetes mellitus without complication (HCC)    GERD (gastroesophageal reflux disease)    Humeral head fracture    Hypercholesteremia    Hypertension    Osteopenia    Positive PPD    Varicose veins    Wrist fracture    History reviewed. No pertinent surgical history. Patient Active Problem List   Diagnosis Date Noted   Pain due to onychomycosis of toenails of both feet 01/06/2022   Type 2 diabetes mellitus with vascular disease (HCC) 01/06/2022    PCP: Charlott Carrier, MD  REFERRING PROVIDER: same  REFERRING DIAG: LE and balance training  THERAPY DIAG:  Muscle weakness (generalized)  Difficulty in walking, not elsewhere classified  Other abnormalities of gait and mobility  Rationale for Evaluation and Treatment: Rehabilitation  ONSET DATE: 2 months ago   SUBJECTIVE:   SUBJECTIVE STATEMENT: This morning I dont feel so hot   PERTINENT HISTORY: H/o generalized weakness with underlying dx of non alcoholic cirrhosis PAIN:  Are you having pain? No  PRECAUTIONS: Fall  RED FLAGS: None   WEIGHT BEARING RESTRICTIONS: No  FALLS:  Has patient fallen in last 6 months? No  LIVING ENVIRONMENT: Lives with: lives alone Lives in: House/apartment Stairs: ramp outdoors Has following equipment at home: Single point cane  OCCUPATION: retired  PLOF: Independent  PATIENT  GOALS: get ore steady, get stronger  NEXT MD VISIT: 3 months  OBJECTIVE:  Note: Objective measures were completed at Evaluation unless otherwise noted.  DIAGNOSTIC FINDINGS: na  PATIENT SURVEYS:  PSFS: THE PATIENT SPECIFIC FUNCTIONAL SCALE  Place score of 0-10 (0 = unable to perform activity and 10 = able to perform activity at the same level as before injury or problem)  Activity Date: 07/11/24    Walking outdoors  3    2. Sit to stand from armless chair 5    3. Using the outdoor steps with rail 4    4. Shower transfers 6    Total Score 4.5     18 Total Score = Sum of activity scores/number of activities  Minimally Detectable Change: 3 points (for single activity); 2 points (for average score)  [employer] [facility] (nd). The Patient Specific Functional Scale . Retrieved from [employer].com.pt **  COGNITION: Overall cognitive status: Within functional limits for tasks assessed     SENSATION: WFL, except reports R lateral thigh numbness  EDEMA:  Wearing knee high compression stockings has hired assistance one hour a day so her helper assists her with donning the stockings  POSTURE: wide base of support    LOWER EXTREMITY ROM:wfl  B    LOWER EXTREMITY MMT:  MMT Right eval Left eval  Hip flexion    Hip extension    Hip abduction    Hip adduction  Hip internal rotation    Hip external rotation    Knee flexion 4 4  Knee extension 5 4/5  Ankle dorsiflexion 5 5  Ankle plantarflexion 3+ 3+  Ankle inversion    Ankle eversion     (Blank rows = not tested)  FUNCTIONAL TESTS:  5 times sit to stand: 14.56 with arms  BERG 24/56 GAIT: Distance walked: 300' Assistive device utilized: None Level of assistance: SBA Walked 2 min 50 sec, then fatigued, occasional weaving using walls, door frames to correct                                                                                                                                 TREATMENT DATE:  08/17/24 Bike L2.5 x6 min   Sit to st and Hands on knees 2x10 6in step ups 2 rails x10 each HS curls 25lb x10, x6  15lb x4 Leg Ext 5lb 2x10    08/15/24:  B knee ext 5#, 2 x 10 B knee flex 20#, 2 x 10 Nustep L 4 x 7 min  Standing in ll bars for heel toe rocks standing on airex Standing on airex feet shoulder width, horizontal head turns Hip adduction ball squeezes  5 sec holds, 10x Sit to stand from hi low table with table elevated 5 x 2 , hands on thighs    07/31/24 NuStep L 3 x 7 min Sit to stands 2x10 HS curls 20lb 2x10 Leg Ext 5lb 2x10 Hip abd blue 2x10 Hip add ball squeeze  07/11/24:   Evaluation, education regarding Berg score    PATIENT EDUCATION:  Education details: POC, goals Person educated: Patient Education method: Medical Illustrator Education comprehension: verbalized understanding  HOME EXERCISE PROGRAM: TBD  ASSESSMENT:  CLINICAL IMPRESSION: Patient is a 89 y.o. female who was treated today by physical therapy due to more instability and LE weakness. Pt fatigues quick requiring frequent rest periods. Improvement made towards sit to stands, not pushing form table. 2 rails needed with step usp and cues for anterior weight shifts. LE did fatigue quick with HS curls, so lighter weight selected.   Should benefit from supervised strengthening and balance activities in a safe setting with phyiscal therapy.  OBJECTIVE IMPAIRMENTS: cardiopulmonary status limiting activity, decreased activity tolerance, decreased balance, decreased endurance, difficulty walking, decreased strength, and improper body mechanics.   ACTIVITY LIMITATIONS: carrying, lifting, bending, standing, squatting, stairs, transfers, and locomotion level  PARTICIPATION LIMITATIONS: meal prep, cleaning, laundry, shopping, community activity, and yard work  PERSONAL FACTORS: Age, Behavior pattern, Fitness, Time since onset of  injury/illness/exacerbation, and 1-2 comorbidities: non alcoholic cirrhosis, DM are also affecting patient's functional outcome.   REHAB POTENTIAL: Good  CLINICAL DECISION MAKING: Evolving/moderate complexity  EVALUATION COMPLEXITY: High   GOALS: Goals reviewed with patient? Yes  SHORT TERM GOALS: Target date: 2 weeks 07/25/24 I HEP Baseline: Goal status: INITIAL   LONG TERM GOALS: Target date: 10/03/24 12 weeks  Improve Berg from 24/56 to 4 /56 or greater Baseline:  Goal status: INITIAL  2.  Able to move 5 x sit to stand without arms from standard chair 11 reps or greater Baseline: reliant on arms Goal status: Progressing 08/17/24  3.  Improve PSFS from 4.5 to 7.5 or greater Baseline:  Goal status: INITIAL  4.  Able to walk 800' without fatigue Baseline: 300' Goal status: INITIAL    PLAN:  PT FREQUENCY: 2x/week  PT DURATION: 12 weeks  PLANNED INTERVENTIONS: 97110-Therapeutic exercises, 97530- Therapeutic activity, 97112- Neuromuscular re-education, 97535- Self Care, 02859- Manual therapy, and Patient/Family education  PLAN FOR NEXT SESSION: cardiovascular training, bulk strengthening for LE's    Tanda KANDICE Sorrow, PTA,  08/17/2024, 10:14 AM   "

## 2024-08-22 ENCOUNTER — Ambulatory Visit

## 2024-08-22 ENCOUNTER — Other Ambulatory Visit: Payer: Self-pay

## 2024-08-22 DIAGNOSIS — R2689 Other abnormalities of gait and mobility: Secondary | ICD-10-CM

## 2024-08-22 DIAGNOSIS — M5459 Other low back pain: Secondary | ICD-10-CM

## 2024-08-22 DIAGNOSIS — M62830 Muscle spasm of back: Secondary | ICD-10-CM

## 2024-08-22 DIAGNOSIS — M6281 Muscle weakness (generalized): Secondary | ICD-10-CM

## 2024-08-22 DIAGNOSIS — R262 Difficulty in walking, not elsewhere classified: Secondary | ICD-10-CM

## 2024-08-22 NOTE — Therapy (Signed)
 " OUTPATIENT PHYSICAL THERAPY LOWER EXTREMITY TREATMENT   Patient Name: Sheila Hanson MRN: [id number] DOB:[birth year], 89 y.o., female Today's Date: 08/22/2024  END OF SESSION:  PT End of Session - 08/22/24 1323     Visit Number 5    Date for Recertification  10/03/24    Authorization Time Period 5    Progress Note Due on Visit 10    PT Start Time 1015    PT Stop Time 1100    PT Time Calculation (min) 45 min    Activity Tolerance Patient tolerated treatment well    Behavior During Therapy [facility] for tasks assessed/performed            Past Medical History:  Diagnosis Date   Cirrhosis (HCC)    Colon adenoma 08/16/2005   Coronary artery disease    Diabetes mellitus without complication (HCC)    GERD (gastroesophageal reflux disease)    Humeral head fracture    Hypercholesteremia    Hypertension    Osteopenia    Positive PPD    Varicose veins    Wrist fracture    History reviewed. No pertinent surgical history. Patient Active Problem List   Diagnosis Date Noted   Pain due to onychomycosis of toenails of both feet 01/06/2022   Type 2 diabetes mellitus with vascular disease (HCC) 01/06/2022    PCP: Sheila Carrier, MD  REFERRING PROVIDER: same  REFERRING DIAG: LE and balance training  THERAPY DIAG:  Muscle weakness (generalized)  Difficulty in walking, not elsewhere classified  Other abnormalities of gait and mobility  Other low back pain  Muscle spasm of back  Rationale for Evaluation and Treatment: Rehabilitation  ONSET DATE: 2 months ago   SUBJECTIVE:   SUBJECTIVE STATEMENT: I have been sleeping a lot and have lost 5 more lbs , I slept from 3 pm yesterday until this am , I lose my balance when I get tired  PERTINENT HISTORY: H/o generalized weakness with underlying dx of non alcoholic cirrhosis PAIN:  Are you having pain? No  PRECAUTIONS: Fall  RED FLAGS: None   WEIGHT BEARING RESTRICTIONS: No  FALLS:  Has patient fallen in last 6  months? No  LIVING ENVIRONMENT: Lives with: lives alone Lives in: House/apartment Stairs: ramp outdoors Has following equipment at home: Single point cane  OCCUPATION: retired  PLOF: Independent  PATIENT GOALS: get ore steady, get stronger  NEXT MD VISIT: 3 months  OBJECTIVE:  Note: Objective measures were completed at Evaluation unless otherwise noted.  DIAGNOSTIC FINDINGS: na  PATIENT SURVEYS:  PSFS: THE PATIENT SPECIFIC FUNCTIONAL SCALE  Place score of 0-10 (0 = unable to perform activity and 10 = able to perform activity at the same level as before injury or problem)  Activity Date: 07/11/24    Walking outdoors  3    2. Sit to stand from armless chair 5    3. Using the outdoor steps with rail 4    4. Shower transfers 6    Total Score 4.5     18 Total Score = Sum of activity scores/number of activities  Minimally Detectable Change: 3 points (for single activity); 2 points (for average score)  [facility] (nd). The Patient Specific Functional Scale . Retrieved from [employer].com.pt **  COGNITION: Overall cognitive status: Within functional limits for tasks assessed     SENSATION: WFL, except reports R lateral thigh numbness  EDEMA:  Wearing knee high compression stockings has hired assistance one hour a day so her helper  assists her with donning the stockings  POSTURE: wide base of support    LOWER EXTREMITY ROM:wfl  B    LOWER EXTREMITY MMT:  MMT Right eval Left eval  Hip flexion    Hip extension    Hip abduction    Hip adduction    Hip internal rotation    Hip external rotation    Knee flexion 4 4  Knee extension 5 4/5  Ankle dorsiflexion 5 5  Ankle plantarflexion 3+ 3+  Ankle inversion    Ankle eversion     (Blank rows = not tested)  FUNCTIONAL TESTS:  5 times sit to stand: 14.56 with arms  BERG 24/56 GAIT: Distance walked: 300' Assistive device utilized: None Level  of assistance: SBA Walked 2 min 50 sec, then fatigued, occasional weaving using walls, door frames to correct                                                                                                                                TREATMENT DATE:  08/22/24:  Resting vital signs: pulse oxygen 99%, BP 157/72 Bike L 2.5 x 6 min Resisted gait: 10# back, laterally, 5 reps each, required CGA, Min A at times , very fatigued after therex and stumbles while walking with st cane from pulley stack to the next station.   Standing in ll bars for alt toe taps to sea urchins with R hand support mass practice    08/17/24 Bike L2.5 x6 min   Sit to st and Hands on knees 2x10 6in step ups 2 rails x10 each HS curls 25lb x10, x6  15lb x4 Leg Ext 5lb 2x10    08/15/24:  B knee ext 5#, 2 x 10 B knee flex 20#, 2 x 10 Nustep L 4 x 7 min  Standing in ll bars for heel toe rocks standing on airex Standing on airex feet shoulder width, horizontal head turns Hip adduction ball squeezes  5 sec holds, 10x Sit to stand from hi low table with table elevated 5 x 2 , hands on thighs    07/31/24 NuStep L 3 x 7 min Sit to stands 2x10 HS curls 20lb 2x10 Leg Ext 5lb 2x10 Hip abd blue 2x10 Hip add ball squeeze  07/11/24:   Evaluation, education regarding Berg score    PATIENT EDUCATION:  Education details: POC, goals Person educated: Patient Education method: Medical Illustrator Education comprehension: verbalized understanding  HOME EXERCISE PROGRAM: TBD  ASSESSMENT:  CLINICAL IMPRESSION: Patient is a 89 y.o. female who participated with physical therapy today due to more instability and LE weakness. Pt fatigues quick requiring frequent rest periods. She stumbled when more fatigued today.  She is to follow up with her MD regarding her liver this Friday.  She is using her cane consistently.  Should benefit from supervised strengthening and balance activities in a safe setting with phyiscal  therapy.  OBJECTIVE IMPAIRMENTS: cardiopulmonary status limiting activity, decreased  activity tolerance, decreased balance, decreased endurance, difficulty walking, decreased strength, and improper body mechanics.   ACTIVITY LIMITATIONS: carrying, lifting, bending, standing, squatting, stairs, transfers, and locomotion level  PARTICIPATION LIMITATIONS: meal prep, cleaning, laundry, shopping, community activity, and yard work  PERSONAL FACTORS: Age, Behavior pattern, Fitness, Time since onset of injury/illness/exacerbation, and 1-2 comorbidities: non alcoholic cirrhosis, DM are also affecting patient's functional outcome.   REHAB POTENTIAL: Good  CLINICAL DECISION MAKING: Evolving/moderate complexity  EVALUATION COMPLEXITY: High   GOALS: Goals reviewed with patient? Yes  SHORT TERM GOALS: Target date: 2 weeks 07/25/24 I HEP Baseline: Goal status: INITIAL   LONG TERM GOALS: Target date: 10/03/24 12 weeks   Improve Berg from 24/56 to 4 /56 or greater Baseline:  Goal status: INITIAL  2.  Able to move 5 x sit to stand without arms from standard chair 11 reps or greater Baseline: reliant on arms Goal status: Progressing 08/17/24  3.  Improve PSFS from 4.5 to 7.5 or greater Baseline:  Goal status: INITIAL  4.  Able to walk 800' without fatigue Baseline: 300' Goal status: INITIAL    PLAN:  PT FREQUENCY: 2x/week  PT DURATION: 12 weeks  PLANNED INTERVENTIONS: 97110-Therapeutic exercises, 97530- Therapeutic activity, [id number]- Neuromuscular re-education, 97535- Self Care, 02859- Manual therapy, and Patient/Family education  PLAN FOR NEXT SESSION: cardiovascular training, bulk strengthening for LE's    Thirza Pellicano L Mikie Misner, PT, DPT, OCS 08/22/2024, 1:38 PM   "

## 2024-08-24 ENCOUNTER — Ambulatory Visit: Admitting: Physical Therapy

## 2024-08-27 ENCOUNTER — Other Ambulatory Visit: Payer: Self-pay

## 2024-08-27 ENCOUNTER — Ambulatory Visit

## 2024-08-27 DIAGNOSIS — R2689 Other abnormalities of gait and mobility: Secondary | ICD-10-CM

## 2024-08-27 DIAGNOSIS — M6281 Muscle weakness (generalized): Secondary | ICD-10-CM | POA: Diagnosis not present

## 2024-08-27 DIAGNOSIS — R262 Difficulty in walking, not elsewhere classified: Secondary | ICD-10-CM

## 2024-08-27 NOTE — Therapy (Addendum)
 " OUTPATIENT PHYSICAL THERAPY LOWER EXTREMITY TREATMENT   Patient Name: Sheila Hanson MRN: [id number] DOB:[birth year], 89 y.o., female Today's Date: 08/27/2024  END OF SESSION:  PT End of Session - 08/27/24 0001     Visit Number 6    Date for Recertification  10/03/24    Authorization Time Period 5    Progress Note Due on Visit 10    PT Start Time 1015    PT Stop Time 1100    PT Time Calculation (min) 45 min    Equipment Utilized During Treatment Gait belt    Activity Tolerance Patient tolerated treatment well    Behavior During Therapy WFL for tasks assessed/performed             Past Medical History:  Diagnosis Date   Cirrhosis (HCC)    Colon adenoma 08/16/2005   Coronary artery disease    Diabetes mellitus without complication (HCC)    GERD (gastroesophageal reflux disease)    Humeral head fracture    Hypercholesteremia    Hypertension    Osteopenia    Positive PPD    Varicose veins    Wrist fracture    No past surgical history on file. Patient Active Problem List   Diagnosis Date Noted   Pain due to onychomycosis of toenails of both feet 01/06/2022   Type 2 diabetes mellitus with vascular disease (HCC) 01/06/2022    PCP: Charlott Carrier, MD  REFERRING PROVIDER: same  REFERRING DIAG: LE and balance training  THERAPY DIAG:  No diagnosis found.  Rationale for Evaluation and Treatment: Rehabilitation  ONSET DATE: 2 months ago   SUBJECTIVE:   SUBJECTIVE STATEMENT: Pt stated she been sleeping most of the weekend and had some almost falls due to balance lost although she was able catch her self. Pt reported no pain today just dryness of mouth when doing activity. No cane used in clinic today.   I have been sleeping a lot and have lost 5 more lbs , I slept from 3 pm yesterday until this am , I lose my balance when I get tired  PERTINENT HISTORY: H/o generalized weakness with underlying dx of non alcoholic cirrhosis PAIN:  Are you having pain?  No  PRECAUTIONS: Fall  RED FLAGS: None   WEIGHT BEARING RESTRICTIONS: No  FALLS:  Has patient fallen in last 6 months? No  LIVING ENVIRONMENT: Lives with: lives alone Lives in: House/apartment Stairs: ramp outdoors Has following equipment at home: Single point cane  OCCUPATION: retired  PLOF: Independent  PATIENT GOALS: get ore steady, get stronger  NEXT MD VISIT: 3 months  OBJECTIVE:  Note: Objective measures were completed at Evaluation unless otherwise noted.  DIAGNOSTIC FINDINGS: na  PATIENT SURVEYS:  PSFS: THE PATIENT SPECIFIC FUNCTIONAL SCALE  Place score of 0-10 (0 = unable to perform activity and 10 = able to perform activity at the same level as before injury or problem)  Activity Date: 07/11/24    Walking outdoors  3    2. Sit to stand from armless chair 5    3. Using the outdoor steps with rail 4    4. Shower transfers 6    Total Score 4.5     18 Total Score = Sum of activity scores/number of activities  Minimally Detectable Change: 3 points (for single activity); 2 points (for average score)  [facility] (nd). The Patient Specific Functional Scale . Retrieved from [employer].com.pt **  COGNITION: Overall cognitive status: Within functional limits for tasks  assessed     SENSATION: WFL, except reports R lateral thigh numbness  EDEMA:  Wearing knee high compression stockings has hired assistance one hour a day so her helper assists her with donning the stockings  POSTURE: wide base of support    LOWER EXTREMITY ROM:wfl  B    LOWER EXTREMITY MMT:  MMT Right eval Left eval  Hip flexion    Hip extension    Hip abduction    Hip adduction    Hip internal rotation    Hip external rotation    Knee flexion 4 4  Knee extension 5 4/5  Ankle dorsiflexion 5 5  Ankle plantarflexion 3+ 3+  Ankle inversion    Ankle eversion     (Blank rows = not tested)  FUNCTIONAL TESTS:   5 times sit to stand: 14.56 with arms  BERG 24/56 GAIT: Distance walked: 300' Assistive device utilized: None Level of assistance: SBA Walked 2 min 50 sec, then fatigued, occasional weaving using walls, door frames to correct                                                                                                                                TREATMENT DATE:  08/27/24 Bike L 2.8 x THERAPEUTIC ACTIVITY: Utilized to increase balance  and coordination in LE towards improving functional goals.  Static standing feet shoulder width apart holding rail head turned in each direction until fatigue. Alternating eyes open and closed Walking step over ball urchin using parallel bars with mod assist gait belt.   THERAPEUTIC EXERCISE:  Utilized to increase muscle strength and endurance in LE Leg ext 2x10 5lbs HS curls 2x10 25lbs Seated isometric adduction with the blue ball   08/22/24:  Resting vital signs: pulse oxygen 99%, BP 157/72 Bike L 2.5 x 6 min Resisted gait: 10# back, laterally, 5 reps each, required CGA, Min A at times , very fatigued after therex and stumbles while walking with st cane from pulley stack to the next station.   Standing in ll bars for alt toe taps to sea urchins with R hand support mass practice    08/17/24 Bike L2.5 x6 min   Sit to st and Hands on knees 2x10 6in step ups 2 rails x10 each HS curls 25lb x10, x6  15lb x4 Leg Ext 5lb 2x10    08/15/24:  B knee ext 5#, 2 x 10 B knee flex 20#, 2 x 10 Nustep L 4 x 7 min  Standing in ll bars for heel toe rocks standing on airex Standing on airex feet shoulder width, horizontal head turns Hip adduction ball squeezes  5 sec holds, 10x Sit to stand from hi low table with table elevated 5 x 2 , hands on thighs    07/31/24 NuStep L 3 x 7 min Sit to stands 2x10 HS curls 20lb 2x10 Leg Ext 5lb 2x10 Hip abd blue 2x10 Hip add ball squeeze  07/11/24:  Evaluation, education regarding Lars score     PATIENT EDUCATION:  Education details: POC, goals Person educated: Patient Education method: Medical Illustrator Education comprehension: verbalized understanding  HOME EXERCISE PROGRAM: TBD  ASSESSMENT:  CLINICAL IMPRESSION:  Pt is a 89 y.o treated for LE weakness, balance, and cardiovascular endurance. Pt requires frequent rest between therapeutic exercise. Pt did not use cane in clinic today. Pt continue to need water throughout treatment session. PT used gait belt and mod assist when performing therapeutic ex, and transitions between activities. Pt could continue to benefit from effective strengthening, balance activities, and endurance training with Physical Therapy.   Patient is a 89 y.o. female who participated with physical therapy today due to more instability and LE weakness. Pt fatigues quick requiring frequent rest periods. She stumbled when more fatigued today.  She is to follow up with her MD regarding her liver this Friday.  She is using her cane consistently.  Should benefit from supervised strengthening and balance activities in a safe setting with phyiscal therapy.  OBJECTIVE IMPAIRMENTS: cardiopulmonary status limiting activity, decreased activity tolerance, decreased balance, decreased endurance, difficulty walking, decreased strength, and improper body mechanics.   ACTIVITY LIMITATIONS: carrying, lifting, bending, standing, squatting, stairs, transfers, and locomotion level  PARTICIPATION LIMITATIONS: meal prep, cleaning, laundry, shopping, community activity, and yard work  PERSONAL FACTORS: Age, Behavior pattern, Fitness, Time since onset of injury/illness/exacerbation, and 1-2 comorbidities: non alcoholic cirrhosis, DM are also affecting patient's functional outcome.   REHAB POTENTIAL: Good  CLINICAL DECISION MAKING: Evolving/moderate complexity  EVALUATION COMPLEXITY: High   GOALS: Goals reviewed with patient? Yes  SHORT TERM GOALS: Target  date: 2 weeks 07/25/24 I HEP Baseline: Goal status: INITIAL   LONG TERM GOALS: Target date: 10/03/24 12 weeks   Improve Berg from 24/56 to 4 /56 or greater Baseline:  Goal status: INITIAL  2.  Able to move 5 x sit to stand without arms from standard chair 11 reps or greater Baseline: reliant on arms Goal status: Progressing 08/17/24  3.  Improve PSFS from 4.5 to 7.5 or greater Baseline:  Goal status: INITIAL  4.  Able to walk 800' without fatigue Baseline: 300' Goal status: INITIAL    PLAN:  PT FREQUENCY: 2x/week  PT DURATION: 12 weeks  PLANNED INTERVENTIONS: 97110-Therapeutic exercises, 97530- Therapeutic activity, 97112- Neuromuscular re-education, 97535- Self Care, [id number]- Manual therapy, and Patient/Family education  PLAN FOR NEXT SESSION: cardiovascular training, bulk strengthening for LE's    Tiawanna Luchsinger, Student-PT 08/27/2024, 11:07 AM   "

## 2024-08-29 ENCOUNTER — Ambulatory Visit (INDEPENDENT_AMBULATORY_CARE_PROVIDER_SITE_OTHER): Admitting: Podiatry

## 2024-08-29 DIAGNOSIS — B351 Tinea unguium: Secondary | ICD-10-CM

## 2024-08-29 DIAGNOSIS — M79674 Pain in right toe(s): Secondary | ICD-10-CM

## 2024-08-29 DIAGNOSIS — E1159 Type 2 diabetes mellitus with other circulatory complications: Secondary | ICD-10-CM

## 2024-08-29 DIAGNOSIS — M79675 Pain in left toe(s): Secondary | ICD-10-CM

## 2024-08-29 NOTE — Progress Notes (Signed)
 This patient returns to my office for at risk foot care.  This patient requires this care by a professional since this patient will be at risk due to having  diabetes.  This patient is unable to cut nails herself since the patient cannot reach her nails.These nails are painful walking and wearing shoes.  This patient presents for at risk foot care today.  General Appearance  Alert, conversant and in no acute stress.  Vascular  Dorsalis pedis and posterior tibial  pulses are  weakly palpable B/L Capillary return is within normal limits  bilaterally. Temperature is within normal limits  bilaterally.  Neurologic  Senn-Weinstein monofilament wire test within normal limits  bilaterally. Muscle power within normal limits bilaterally.  Nails Thick disfigured discolored nails with subungual debris  from hallux to fifth toes bilaterally. No evidence of bacterial infection or drainage bilaterally.  Orthopedic  No limitations of motion  feet .  No crepitus or effusions noted.  No bony pathology or digital deformities noted.  Skin  normotropic skin with no porokeratosis noted bilaterally.  No signs of infections or ulcers noted.     Onychomycosis  Pain in right toes  Pain in left toes  Consent was obtained for treatment procedures.   Mechanical debridement of nails 1-5  bilaterally performed with a nail nipper.  Filed with dremel without incident.    Return office visit   3 months                   Told patient to return for periodic foot care and evaluation due to potential at risk complications.   Helane Gunther DPM

## 2024-08-30 ENCOUNTER — Encounter: Payer: Self-pay | Admitting: Physical Therapy

## 2024-08-30 ENCOUNTER — Ambulatory Visit: Admitting: Physical Therapy

## 2024-08-30 DIAGNOSIS — M6281 Muscle weakness (generalized): Secondary | ICD-10-CM | POA: Diagnosis not present

## 2024-08-30 DIAGNOSIS — R262 Difficulty in walking, not elsewhere classified: Secondary | ICD-10-CM

## 2024-08-30 DIAGNOSIS — R2689 Other abnormalities of gait and mobility: Secondary | ICD-10-CM

## 2024-08-30 NOTE — Therapy (Signed)
 " OUTPATIENT PHYSICAL THERAPY LOWER EXTREMITY TREATMENT   Patient Name: Sheila Hanson MRN: [id number] DOB:10/31/1933, 89 y.o., female Today's Date: 08/30/2024  END OF SESSION:  PT End of Session - 08/30/24 1017     Visit Number 7    Date for Recertification  10/03/24    PT Start Time 1015    PT Stop Time 1100    PT Time Calculation (min) 45 min    Activity Tolerance Patient tolerated treatment well    Behavior During Therapy [facility] for tasks assessed/performed             Past Medical History:  Diagnosis Date   Cirrhosis (HCC)    Colon adenoma 08/16/2005   Coronary artery disease    Diabetes mellitus without complication (HCC)    GERD (gastroesophageal reflux disease)    Humeral head fracture    Hypercholesteremia    Hypertension    Osteopenia    Positive PPD    Varicose veins    Wrist fracture    History reviewed. No pertinent surgical history. Patient Active Problem List   Diagnosis Date Noted   Pain due to onychomycosis of toenails of both feet 01/06/2022   Type 2 diabetes mellitus with vascular disease (HCC) 01/06/2022    PCP: Charlott Carrier, MD  REFERRING PROVIDER: same  REFERRING DIAG: LE and balance training  THERAPY DIAG:  Muscle weakness (generalized)  Difficulty in walking, not elsewhere classified  Other abnormalities of gait and mobility  Rationale for Evaluation and Treatment: Rehabilitation  ONSET DATE: 2 months ago   SUBJECTIVE:   SUBJECTIVE STATEMENT: Slow, just tired  I have been sleeping a lot and have lost 5 more lbs , I slept from 3 pm yesterday until this am , I lose my balance when I get tired  PERTINENT HISTORY: H/o generalized weakness with underlying dx of non alcoholic cirrhosis PAIN:  Are you having pain?   PRECAUTIONS: Fall  RED FLAGS: None   WEIGHT BEARING RESTRICTIONS: No  FALLS:  Has patient fallen in last 6 months? No  LIVING ENVIRONMENT: Lives with: lives alone Lives in:  House/apartment Stairs: ramp outdoors Has following equipment at home: Single point cane  OCCUPATION: retired  PLOF: Independent  PATIENT GOALS: get ore steady, get stronger  NEXT MD VISIT: 3 months  OBJECTIVE:  Note: Objective measures were completed at Evaluation unless otherwise noted.  DIAGNOSTIC FINDINGS: na  PATIENT SURVEYS:  PSFS: THE PATIENT SPECIFIC FUNCTIONAL SCALE  Place score of 0-10 (0 = unable to perform activity and 10 = able to perform activity at the same level as before injury or problem)  Activity Date: 07/11/24 08/30/24   Walking outdoors  3 5   2. Sit to stand from armless chair 5 5   3. Using the outdoor steps with rail 4 5   4. Shower transfers 6 8   Total Score 4.5 5.75    18 Total Score = Sum of activity scores/number of activities  Minimally Detectable Change: 3 points (for single activity); 2 points (for average score)  [facility] (nd). The Patient Specific Functional Scale . Retrieved from [employer]com.pt **  COGNITION: Overall cognitive status: Within functional limits for tasks assessed     SENSATION: WFL, except reports R lateral thigh numbness  EDEMA:  Wearing knee high compression stockings has hired assistance one hour a day so her helper assists her with donning the stockings  POSTURE: wide base of support    LOWER EXTREMITY ROM:wfl  B  LOWER EXTREMITY MMT:  MMT Right eval Left eval  Hip flexion    Hip extension    Hip abduction    Hip adduction    Hip internal rotation    Hip external rotation    Knee flexion 4 4  Knee extension 5 4/5  Ankle dorsiflexion 5 5  Ankle plantarflexion 3+ 3+  Ankle inversion    Ankle eversion     (Blank rows = not tested)  FUNCTIONAL TESTS:  5 times sit to stand: 14.56 with arms  BERG 24/56 GAIT: Distance walked: 300' Assistive device utilized: None Level of assistance: SBA Walked 2 min 50 sec, then  fatigued, occasional weaving using walls, door frames to correct                                                                                                                                TREATMENT DATE:  08/31/23 Bike L 2.8 x HS curls 2x12 20lbs Leg Ext 5lb 2x12 PSFS 5.75 6in step ups 1 rail x10 each, x 5 each Hip add ball squeeze   08/27/24 Bike L 2.8 x THERAPEUTIC ACTIVITY: Utilized to increase balance  and coordination in LE towards improving functional goals.  Static standing feet shoulder width apart holding rail head turned in each direction until fatigue. Alternating eyes open and closed Walking step over ball urchin using parallel bars with mod assist gait belt.   THERAPEUTIC EXERCISE:  Utilized to increase muscle strength and endurance in LE Leg ext 2x10 5lbs HS curls 2x10 25lbs Seated isometric adduction with the blue ball   08/22/24:  Resting vital signs: pulse oxygen 99%, BP 157/72 Bike L 2.5 x 6 min Resisted gait: 10# back, laterally, 5 reps each, required CGA, Min A at times , very fatigued after therex and stumbles while walking with st cane from pulley stack to the next station.   Standing in ll bars for alt toe taps to sea urchins with R hand support mass practice    08/17/24 Bike L2.5 x6 min   Sit to st and Hands on knees 2x10 6in step ups 2 rails x10 each HS curls 25lb x10, x6  15lb x4 Leg Ext 5lb 2x10    08/15/24:  B knee ext 5#, 2 x 10 B knee flex 20#, 2 x 10 Nustep L 4 x 7 min  Standing in ll bars for heel toe rocks standing on airex Standing on airex feet shoulder width, horizontal head turns Hip adduction ball squeezes  5 sec holds, 10x Sit to stand from hi low table with table elevated 5 x 2 , hands on thighs    07/31/24 NuStep L 3 x 7 min Sit to stands 2x10 HS curls 20lb 2x10 Leg Ext 5lb 2x10 Hip abd blue 2x10 Hip add ball squeeze  07/11/24:   Evaluation, education regarding Lars score    PATIENT EDUCATION:  Education  details: POC, goals Person educated: Patient Education  method: Explanation and Demonstration Education comprehension: verbalized understanding  HOME EXERCISE PROGRAM: TBD  ASSESSMENT:  CLINICAL IMPRESSION:  Pt is a 89 y.o treated for LE weakness, balance, and cardiovascular endurance. Pt requires frequent rest between therapeutic exercise. Pt did not use cane in clinic today. She has progressed Meeting her PSFS goal. Cues for sequencing needed with step ups. Pt could continue to benefit from effective strengthening, balance activities, and endurance training with Physical Therapy.   Patient is a 89 y.o. female who participated with physical therapy today due to more instability and LE weakness. Pt fatigues quick requiring frequent rest periods. She stumbled when more fatigued today.  She is to follow up with her MD regarding her liver this Friday.  She is using her cane consistently.  Should benefit from supervised strengthening and balance activities in a safe setting with phyiscal therapy.  OBJECTIVE IMPAIRMENTS: cardiopulmonary status limiting activity, decreased activity tolerance, decreased balance, decreased endurance, difficulty walking, decreased strength, and improper body mechanics.   ACTIVITY LIMITATIONS: carrying, lifting, bending, standing, squatting, stairs, transfers, and locomotion level  PARTICIPATION LIMITATIONS: meal prep, cleaning, laundry, shopping, community activity, and yard work  PERSONAL FACTORS: Age, Behavior pattern, Fitness, Time since onset of injury/illness/exacerbation, and 1-2 comorbidities: non alcoholic cirrhosis, DM are also affecting patient's functional outcome.   REHAB POTENTIAL: Good  CLINICAL DECISION MAKING: Evolving/moderate complexity  EVALUATION COMPLEXITY: High   GOALS: Goals reviewed with patient? Yes  SHORT TERM GOALS: Target date: 2 weeks 07/25/24 I HEP Baseline: Goal status: INITIAL   LONG TERM GOALS: Target date: 10/03/24 12  weeks   Improve Berg from 24/56 to 4 /56 or greater Baseline:  Goal status: INITIAL  2.  Able to move 5 x sit to stand without arms from standard chair 11 reps or greater Baseline: reliant on arms Goal status: Progressing 08/17/24  3.  Improve PSFS from 4.5 to 7.5 or greater Baseline:  Goal status: Met 08/30/24  4.  Able to walk 800' without fatigue Baseline: 300' Goal status: INITIAL    PLAN:  PT FREQUENCY: 2x/week  PT DURATION: 12 weeks  PLANNED INTERVENTIONS: 97110-Therapeutic exercises, 97530- Therapeutic activity, 97112- Neuromuscular re-education, 97535- Self Care, [id number]- Manual therapy, and Patient/Family education  PLAN FOR NEXT SESSION: cardiovascular training, bulk strengthening for LE's    Tanda KANDICE Sorrow, PTA 08/30/2024, 10:19 AM   "

## 2024-09-03 ENCOUNTER — Other Ambulatory Visit: Payer: Self-pay

## 2024-09-03 ENCOUNTER — Ambulatory Visit

## 2024-09-03 DIAGNOSIS — M6281 Muscle weakness (generalized): Secondary | ICD-10-CM | POA: Diagnosis not present

## 2024-09-03 DIAGNOSIS — R262 Difficulty in walking, not elsewhere classified: Secondary | ICD-10-CM

## 2024-09-03 NOTE — Therapy (Signed)
 " OUTPATIENT PHYSICAL THERAPY LOWER EXTREMITY TREATMENT   Patient Name: Sheila Hanson MRN: [id number] DOB:1934/07/02, 89 y.o., female Today's Date: 09/03/2024  END OF SESSION:  PT End of Session - 09/03/24 1021     Visit Number 8    Date for Recertification  10/03/24    Progress Note Due on Visit 10    PT Start Time 1017    PT Stop Time 1100    PT Time Calculation (min) 43 min    Activity Tolerance Patient tolerated treatment well    Behavior During Therapy [facility] for tasks assessed/performed              Past Medical History:  Diagnosis Date   Cirrhosis (HCC)    Colon adenoma 08/16/2005   Coronary artery disease    Diabetes mellitus without complication (HCC)    GERD (gastroesophageal reflux disease)    Humeral head fracture    Hypercholesteremia    Hypertension    Osteopenia    Positive PPD    Varicose veins    Wrist fracture    History reviewed. No pertinent surgical history. Patient Active Problem List   Diagnosis Date Noted   Pain due to onychomycosis of toenails of both feet 01/06/2022   Type 2 diabetes mellitus with vascular disease (HCC) 01/06/2022    PCP: Charlott Carrier, MD  REFERRING PROVIDER: same  REFERRING DIAG: LE and balance training  THERAPY DIAG:  Muscle weakness (generalized)  Difficulty in walking, not elsewhere classified  Rationale for Evaluation and Treatment: Rehabilitation  ONSET DATE: 2 months ago   SUBJECTIVE:   SUBJECTIVE STATEMENT: Reports she thinks she has lost 7.2 # since Jan 1 according to her scale, has appt with her PCP at end of next week   I have been sleeping a lot and have lost 5 more lbs , I slept from 3 pm yesterday until this am , I lose my balance when I get tired  PERTINENT HISTORY: H/o generalized weakness with underlying dx of non alcoholic cirrhosis PAIN:  Are you having pain?   PRECAUTIONS: Fall  RED FLAGS: None   WEIGHT BEARING RESTRICTIONS: No  FALLS:  Has patient fallen in last 6  months? No  LIVING ENVIRONMENT: Lives with: lives alone Lives in: House/apartment Stairs: ramp outdoors Has following equipment at home: Single point cane  OCCUPATION: retired  PLOF: Independent  PATIENT GOALS: get ore steady, get stronger  NEXT MD VISIT: 3 months  OBJECTIVE:  Note: Objective measures were completed at Evaluation unless otherwise noted.  DIAGNOSTIC FINDINGS: na  PATIENT SURVEYS:  PSFS: THE PATIENT SPECIFIC FUNCTIONAL SCALE  Place score of 0-10 (0 = unable to perform activity and 10 = able to perform activity at the same level as before injury or problem)  Activity Date: 07/11/24 08/30/24   Walking outdoors  3 5   2. Sit to stand from armless chair 5 5   3. Using the outdoor steps with rail 4 5   4. Shower transfers 6 8   Total Score 4.5 5.75    18 Total Score = Sum of activity scores/number of activities  Minimally Detectable Change: 3 points (for single activity); 2 points (for average score)  [facility] (nd). The Patient Specific Functional Scale . Retrieved from [employer].com.pt **  COGNITION: Overall cognitive status: Within functional limits for tasks assessed     SENSATION: WFL, except reports R lateral thigh numbness  EDEMA:  Wearing knee high compression stockings has hired assistance one hour  a day so her helper assists her with donning the stockings  POSTURE: wide base of support    LOWER EXTREMITY ROM:wfl  B    LOWER EXTREMITY MMT:  MMT Right eval Left eval  Hip flexion    Hip extension    Hip abduction    Hip adduction    Hip internal rotation    Hip external rotation    Knee flexion 4 4  Knee extension 5 4/5  Ankle dorsiflexion 5 5  Ankle plantarflexion 3+ 3+  Ankle inversion    Ankle eversion     (Blank rows = not tested)  FUNCTIONAL TESTS:  5 times sit to stand: 14.56 with arms  BERG 24/56 GAIT: Distance walked: 300' Assistive device  utilized: None Level of assistance: SBA Walked 2 min 50 sec, then fatigued, occasional weaving using walls, door frames to correct                                                                                                                                TREATMENT DATE:  09/03/24:  BP seated:134/67 Resisted gait backwards, sideways, 5 reps each, needed CGA/min A 20# Nustep L 5 x 6 min Ue's and LE's  In ll bars for static standing with feet together, eyes closed, and with feet together eyes open with hed nods and head turns Lateral side stepping over sea urchin, pt tends to step behind the target instead of flexing hip to clear it  Moved to 6 step and performed alt forward toe taps  Lateral toe taps 15 x each leg   08/31/23 Bike L 2.8 x HS curls 2x12 20lbs Leg Ext 5lb 2x12 PSFS 5.75 6in step ups 1 rail x10 each, x 5 each Hip add ball squeeze   08/27/24 Bike L 2.8 x THERAPEUTIC ACTIVITY: Utilized to increase balance  and coordination in LE towards improving functional goals.  Static standing feet shoulder width apart holding rail head turned in each direction until fatigue. Alternating eyes open and closed Walking step over ball urchin using parallel bars with mod assist gait belt.   THERAPEUTIC EXERCISE:  Utilized to increase muscle strength and endurance in LE Leg ext 2x10 5lbs HS curls 2x10 25lbs Seated isometric adduction with the blue ball   08/22/24:  Resting vital signs: pulse oxygen 99%, BP 157/72 Bike L 2.5 x 6 min Resisted gait: 10# back, laterally, 5 reps each, required CGA, Min A at times , very fatigued after therex and stumbles while walking with st cane from pulley stack to the next station.   Standing in ll bars for alt toe taps to sea urchins with R hand support mass practice    08/17/24 Bike L2.5 x6 min   Sit to st and Hands on knees 2x10 6in step ups 2 rails x10 each HS curls 25lb x10, x6  15lb x4 Leg Ext 5lb 2x10    08/15/24:  B knee ext  5#, 2 x 10 B knee flex 20#, 2 x 10 Nustep L 4 x 7 min  Standing in ll bars for heel toe rocks standing on airex Standing on airex feet shoulder width, horizontal head turns Hip adduction ball squeezes  5 sec holds, 10x Sit to stand from hi low table with table elevated 5 x 2 , hands on thighs    07/31/24 NuStep L 3 x 7 min Sit to stands 2x10 HS curls 20lb 2x10 Leg Ext 5lb 2x10 Hip abd blue 2x10 Hip add ball squeeze  07/11/24:   Evaluation, education regarding Berg score    PATIENT EDUCATION:  Education details: POC, goals Person educated: Patient Education method: [employer] Education comprehension: verbalized understanding  HOME EXERCISE PROGRAM: TBD  ASSESSMENT:  CLINICAL IMPRESSION:  Pt is a 89 y.o attended PT session due to LE weakness, balance, and cardiovascular endurance. Pt requires frequent rest between therapeutic exercise. She was quite weak, somewhat lethargic today, required close monitoring for her safety.  Pt did not use cane in clinic today. She states she has lost 7 # since Jan 1 which is concerning, she is to follow up with her PCP at end of next week .  Pt could continue to benefit from effective strengthening, balance activities, and endurance training with Physical Therapy.   Patient is a 89 y.o. female who participated with physical therapy today due to more instability and LE weakness. Pt fatigues quick requiring frequent rest periods. She stumbled when more fatigued today.  She is to follow up with her MD regarding her liver this Friday.  She is using her cane consistently.  Should benefit from supervised strengthening and balance activities in a safe setting with phyiscal therapy.  OBJECTIVE IMPAIRMENTS: cardiopulmonary status limiting activity, decreased activity tolerance, decreased balance, decreased endurance, difficulty walking, decreased strength, and improper body mechanics.   ACTIVITY LIMITATIONS: carrying, lifting, bending,  standing, squatting, stairs, transfers, and locomotion level  PARTICIPATION LIMITATIONS: meal prep, cleaning, laundry, shopping, community activity, and yard work  PERSONAL FACTORS: Age, Behavior pattern, Fitness, Time since onset of injury/illness/exacerbation, and 1-2 comorbidities: non alcoholic cirrhosis, DM are also affecting patient's functional outcome.   REHAB POTENTIAL: Good  CLINICAL DECISION MAKING: Evolving/moderate complexity  EVALUATION COMPLEXITY: High   GOALS: Goals reviewed with patient? Yes  SHORT TERM GOALS: Target date: 2 weeks 07/25/24 I HEP Baseline: Goal status: INITIAL   LONG TERM GOALS: Target date: 10/03/24 12 weeks   Improve Berg from 24/56 to 4 /56 or greater Baseline:  Goal status: INITIAL  2.  Able to move 5 x sit to stand without arms from standard chair 11 reps or greater Baseline: reliant on arms Goal status: Progressing 08/17/24  3.  Improve PSFS from 4.5 to 7.5 or greater Baseline:  Goal status: Met 08/30/24  4.  Able to walk 800' without fatigue Baseline: 300' Goal status: INITIAL    PLAN:  PT FREQUENCY: 2x/week  PT DURATION: 12 weeks  PLANNED INTERVENTIONS: 97110-Therapeutic exercises, 97530- Therapeutic activity, [id number]- Neuromuscular re-education, 97535- Self Care, [id number]- Manual therapy, and Patient/Family education  PLAN FOR NEXT SESSION: cardiovascular training, bulk strengthening for LE's    Gibson Telleria L Davonte Siebenaler, PT, DPT, OCS 09/03/2024, 5:14 PM   "

## 2024-09-07 ENCOUNTER — Ambulatory Visit: Admitting: Physical Therapy

## 2024-09-07 ENCOUNTER — Encounter: Payer: Self-pay | Admitting: Physical Therapy

## 2024-09-07 DIAGNOSIS — M6281 Muscle weakness (generalized): Secondary | ICD-10-CM | POA: Diagnosis not present

## 2024-09-07 DIAGNOSIS — R2689 Other abnormalities of gait and mobility: Secondary | ICD-10-CM

## 2024-09-07 DIAGNOSIS — R262 Difficulty in walking, not elsewhere classified: Secondary | ICD-10-CM

## 2024-09-07 DIAGNOSIS — M62830 Muscle spasm of back: Secondary | ICD-10-CM

## 2024-09-07 DIAGNOSIS — M5459 Other low back pain: Secondary | ICD-10-CM

## 2024-09-07 NOTE — Therapy (Signed)
 " OUTPATIENT PHYSICAL THERAPY LOWER EXTREMITY TREATMENT   Patient Name: Sheila Hanson MRN: [id number] DOB:04-22-1934, 89 y.o., female Today's Date: 09/07/2024  END OF SESSION:  PT End of Session - 09/07/24 1017     Visit Number 9    Date for Recertification  10/03/24    PT Start Time 1015    PT Stop Time 1100    PT Time Calculation (min) 45 min    Activity Tolerance Patient tolerated treatment well    Behavior During Therapy [facility] for tasks assessed/performed              Past Medical History:  Diagnosis Date   Cirrhosis (HCC)    Colon adenoma 08/16/2005   Coronary artery disease    Diabetes mellitus without complication (HCC)    GERD (gastroesophageal reflux disease)    Humeral head fracture    Hypercholesteremia    Hypertension    Osteopenia    Positive PPD    Varicose veins    Wrist fracture    History reviewed. No pertinent surgical history. Patient Active Problem List   Diagnosis Date Noted   Pain due to onychomycosis of toenails of both feet 01/06/2022   Type 2 diabetes mellitus with vascular disease (HCC) 01/06/2022    PCP: Charlott Carrier, MD  REFERRING PROVIDER: same  REFERRING DIAG: LE and balance training  THERAPY DIAG:  Muscle weakness (generalized)  Difficulty in walking, not elsewhere classified  Other abnormalities of gait and mobility  Other low back pain  Muscle spasm of back  Rationale for Evaluation and Treatment: Rehabilitation  ONSET DATE: 2 months ago   SUBJECTIVE:   SUBJECTIVE STATEMENT: Alright I reckon, out of breath   I have been sleeping a lot and have lost 5 more lbs , I slept from 3 pm yesterday until this am , I lose my balance when I get tired  PERTINENT HISTORY: H/o generalized weakness with underlying dx of non alcoholic cirrhosis PAIN:  Are you having pain?   PRECAUTIONS: Fall  RED FLAGS: None   WEIGHT BEARING RESTRICTIONS: No  FALLS:  Has patient fallen in last 6 months? No  LIVING  ENVIRONMENT: Lives with: lives alone Lives in: House/apartment Stairs: ramp outdoors Has following equipment at home: Single point cane  OCCUPATION: retired  PLOF: Independent  PATIENT GOALS: get ore steady, get stronger  NEXT MD VISIT: 3 months  OBJECTIVE:  Note: Objective measures were completed at Evaluation unless otherwise noted.  DIAGNOSTIC FINDINGS: na  PATIENT SURVEYS:  PSFS: THE PATIENT SPECIFIC FUNCTIONAL SCALE  Place score of 0-10 (0 = unable to perform activity and 10 = able to perform activity at the same level as before injury or problem)  Activity Date: 07/11/24 08/30/24   Walking outdoors  3 5   2. Sit to stand from armless chair 5 5   3. Using the outdoor steps with rail 4 5   4. Shower transfers 6 8   Total Score 4.5 5.75    18 Total Score = Sum of activity scores/number of activities  Minimally Detectable Change: 3 points (for single activity); 2 points (for average score)  [employer] [facility] (nd). The Patient Specific Functional Scale . Retrieved from [employer].pt **  COGNITION: Overall cognitive status: Within functional limits for tasks assessed     SENSATION: WFL, except reports R lateral thigh numbness  EDEMA:  Wearing knee high compression stockings has hired assistance one hour a day so her helper assists her with donning the stockings  POSTURE: wide base of support    LOWER EXTREMITY ROM:wfl  B    LOWER EXTREMITY MMT:  MMT Right eval Left eval  Hip flexion    Hip extension    Hip abduction    Hip adduction    Hip internal rotation    Hip external rotation    Knee flexion 4 4  Knee extension 5 4/5  Ankle dorsiflexion 5 5  Ankle plantarflexion 3+ 3+  Ankle inversion    Ankle eversion     (Blank rows = not tested)  FUNCTIONAL TESTS:  5 times sit to stand: 14.56 with arms  BERG 24/56 GAIT: Distance walked: 300' Assistive device utilized: None Level of  assistance: SBA Walked 2 min 50 sec, then fatigued, occasional weaving using walls, door frames to correct                                                                                                                                TREATMENT DATE:  09/07/24 NuStep L 5 x  min Gait 8 laps in clinic ~ 816ft  Sit to stands 2x10 6in step ups x10 2 rails Alt 4in box taps from airex in // bars  09/03/24:  BP seated:134/67 Resisted gait backwards, sideways, 5 reps each, needed CGA/min A 20# Nustep L 5 x 6 min Ue's and LE's  In ll bars for static standing with feet together, eyes closed, and with feet together eyes open with hed nods and head turns Lateral side stepping over sea urchin, pt tends to step behind the target instead of flexing hip to clear it  Moved to 6 step and performed alt forward toe taps  Lateral toe taps 15 x each leg   08/31/23 Bike L 2.8 x HS curls 2x12 20lbs Leg Ext 5lb 2x12 PSFS 5.75 6in step ups 1 rail x10 each, x 5 each Hip add ball squeeze   08/27/24 Bike L 2.8 x THERAPEUTIC ACTIVITY: Utilized to increase balance  and coordination in LE towards improving functional goals.  Static standing feet shoulder width apart holding rail head turned in each direction until fatigue. Alternating eyes open and closed Walking step over ball urchin using parallel bars with mod assist gait belt.   THERAPEUTIC EXERCISE:  Utilized to increase muscle strength and endurance in LE Leg ext 2x10 5lbs HS curls 2x10 25lbs Seated isometric adduction with the blue ball   08/22/24:  Resting vital signs: pulse oxygen 99%, BP 157/72 Bike L 2.5 x 6 min Resisted gait: 10# back, laterally, 5 reps each, required CGA, Min A at times , very fatigued after therex and stumbles while walking with st cane from pulley stack to the next station.   Standing in ll bars for alt toe taps to sea urchins with R hand support mass practice    08/17/24 Bike L2.5 x6 min   Sit to st and Hands on  knees 2x10 6in step ups 2  rails x10 each HS curls 25lb x10, x6  15lb x4 Leg Ext 5lb 2x10    08/15/24:  B knee ext 5#, 2 x 10 B knee flex 20#, 2 x 10 Nustep L 4 x 7 min  Standing in ll bars for heel toe rocks standing on airex Standing on airex feet shoulder width, horizontal head turns Hip adduction ball squeezes  5 sec holds, 10x Sit to stand from hi low table with table elevated 5 x 2 , hands on thighs    07/31/24 NuStep L 3 x 7 min Sit to stands 2x10 HS curls 20lb 2x10 Leg Ext 5lb 2x10 Hip abd blue 2x10 Hip add ball squeeze  07/11/24:   Evaluation, education regarding Berg score    PATIENT EDUCATION:  Education details: POC, goals Person educated: Patient Education method: Medical Illustrator Education comprehension: verbalized understanding  HOME EXERCISE PROGRAM: TBD  ASSESSMENT:  CLINICAL IMPRESSION:  Pt is a 89 y.o attended PT session due to LE weakness, balance, and cardiovascular endurance. Pt requires frequent rest between therapeutic exercise. She is able to ambulate 800 feet but with some fatigue. Fatigue with sit to stnds and step ups. Instability standing on airex. Pt did not use cane in clinic today.  Pt could continue to benefit from effective strengthening, balance activities, and endurance training with Physical Therapy.   Patient is a 89 y.o. female who participated with physical therapy today due to more instability and LE weakness. Pt fatigues quick requiring frequent rest periods. She stumbled when more fatigued today.  She is to follow up with her MD regarding her liver this Friday.  She is using her cane consistently.  Should benefit from supervised strengthening and balance activities in a safe setting with phyiscal therapy.  OBJECTIVE IMPAIRMENTS: cardiopulmonary status limiting activity, decreased activity tolerance, decreased balance, decreased endurance, difficulty walking, decreased strength, and improper body mechanics.    ACTIVITY LIMITATIONS: carrying, lifting, bending, standing, squatting, stairs, transfers, and locomotion level  PARTICIPATION LIMITATIONS: meal prep, cleaning, laundry, shopping, community activity, and yard work  PERSONAL FACTORS: Age, Behavior pattern, Fitness, Time since onset of injury/illness/exacerbation, and 1-2 comorbidities: non alcoholic cirrhosis, DM are also affecting patient's functional outcome.   REHAB POTENTIAL: Good  CLINICAL DECISION MAKING: Evolving/moderate complexity  EVALUATION COMPLEXITY: High   GOALS: Goals reviewed with patient? Yes  SHORT TERM GOALS: Target date: 2 weeks 07/25/24 I HEP Baseline: Goal status: INITIAL   LONG TERM GOALS: Target date: 10/03/24 12 weeks   Improve Berg from 24/56 to 4 /56 or greater Baseline:  Goal status: INITIAL  2.  Able to move 5 x sit to stand without arms from standard chair 11 reps or greater Baseline: reliant on arms Goal status: Progressing 08/17/24  3.  Improve PSFS from 4.5 to 7.5 or greater Baseline:  Goal status: Met 08/30/24  4.  Able to walk 800' without fatigue Baseline: 300' Goal status: Partly Met completed the distance with fatigue /    PLAN:  PT FREQUENCY: 2x/week  PT DURATION: 12 weeks  PLANNED INTERVENTIONS: 97110-Therapeutic exercises, 97530- Therapeutic activity, 97112- Neuromuscular re-education, 97535- Self Care, [id number]- Manual therapy, and Patient/Family education  PLAN FOR NEXT SESSION: cardiovascular training, bulk strengthening for LE's    Tanda KANDICE Sorrow, PTA, 09/07/2024, 10:17 AM  / "

## 2024-09-10 ENCOUNTER — Ambulatory Visit

## 2024-09-13 ENCOUNTER — Ambulatory Visit: Admitting: Physical Therapy

## 2024-09-14 ENCOUNTER — Ambulatory Visit: Admitting: Physical Therapy

## 2024-09-14 ENCOUNTER — Encounter: Payer: Self-pay | Admitting: Physical Therapy

## 2024-09-14 DIAGNOSIS — M6281 Muscle weakness (generalized): Secondary | ICD-10-CM | POA: Diagnosis not present

## 2024-09-14 DIAGNOSIS — R2689 Other abnormalities of gait and mobility: Secondary | ICD-10-CM

## 2024-09-14 DIAGNOSIS — R262 Difficulty in walking, not elsewhere classified: Secondary | ICD-10-CM

## 2024-09-14 NOTE — Therapy (Signed)
 " OUTPATIENT PHYSICAL THERAPY LOWER EXTREMITY TREATMENT Progress Note Reporting Period 07/11/24 to 09/14/24  See note below for Objective Data and Assessment of Progress/Goals.      Patient Name: Sheila Hanson MRN: [id number] DOB:[birth year], 89 y.o., female Today's Date: 09/14/2024  END OF SESSION:  PT End of Session - 09/14/24 1018     Visit Number 10    Date for Recertification  10/03/24    PT Start Time 1015    PT Stop Time 1100    PT Time Calculation (min) 45 min    Activity Tolerance Patient tolerated treatment well    Behavior During Therapy [facility] for tasks assessed/performed              Past Medical History:  Diagnosis Date   Cirrhosis (HCC)    Colon adenoma 08/16/2005   Coronary artery disease    Diabetes mellitus without complication (HCC)    GERD (gastroesophageal reflux disease)    Humeral head fracture    Hypercholesteremia    Hypertension    Osteopenia    Positive PPD    Varicose veins    Wrist fracture    History reviewed. No pertinent surgical history. Patient Active Problem List   Diagnosis Date Noted   Pain due to onychomycosis of toenails of both feet 01/06/2022   Type 2 diabetes mellitus with vascular disease (HCC) 01/06/2022    PCP: Charlott Carrier, MD  REFERRING PROVIDER: same  REFERRING DIAG: LE and balance training  THERAPY DIAG:  Muscle weakness (generalized)  Difficulty in walking, not elsewhere classified  Other abnormalities of gait and mobility  Rationale for Evaluation and Treatment: Rehabilitation  ONSET DATE: 2 months ago   SUBJECTIVE:   SUBJECTIVE STATEMENT: Alright I reckon, out of breath   I have been sleeping a lot and have lost 5 more lbs , I slept from 3 pm yesterday until this am , I lose my balance when I get tired  PERTINENT HISTORY: H/o generalized weakness with underlying dx of non alcoholic cirrhosis PAIN:  Are you having pain?   PRECAUTIONS: Fall  RED FLAGS: None   WEIGHT BEARING  RESTRICTIONS: No  FALLS:  Has patient fallen in last 6 months? No  LIVING ENVIRONMENT: Lives with: lives alone Lives in: House/apartment Stairs: ramp outdoors Has following equipment at home: Single point cane  OCCUPATION: retired  PLOF: Independent  PATIENT GOALS: get ore steady, get stronger  NEXT MD VISIT: 3 months  OBJECTIVE:  Note: Objective measures were completed at Evaluation unless otherwise noted.  DIAGNOSTIC FINDINGS: na  PATIENT SURVEYS:  PSFS: THE PATIENT SPECIFIC FUNCTIONAL SCALE  Place score of 0-10 (0 = unable to perform activity and 10 = able to perform activity at the same level as before injury or problem)  Activity Date: 07/11/24 08/30/24   Walking outdoors  3 5   2. Sit to stand from armless chair 5 5   3. Using the outdoor steps with rail 4 5   4. Shower transfers 6 8   Total Score 4.5 5.75    18 Total Score = Sum of activity scores/number of activities  Minimally Detectable Change: 3 points (for single activity); 2 points (for average score)  [facility] (nd). The Patient Specific Functional Scale . Retrieved from [email or web address]pt **  COGNITION: Overall cognitive status: Within functional limits for tasks assessed     SENSATION: WFL, except reports R lateral thigh numbness  EDEMA:  Wearing knee high compression stockings has hired assistance one hour  a day so her helper assists her with donning the stockings  POSTURE: wide base of support    LOWER EXTREMITY ROM:wfl  B    LOWER EXTREMITY MMT:  MMT Right eval Left eval  Hip flexion    Hip extension    Hip abduction    Hip adduction    Hip internal rotation    Hip external rotation    Knee flexion 4 4  Knee extension 5 4/5  Ankle dorsiflexion 5 5  Ankle plantarflexion 3+ 3+  Ankle inversion    Ankle eversion     (Blank rows = not tested)  FUNCTIONAL TESTS:  5 times sit to stand: 14.56 with arms  BERG  24/56 GAIT: Distance walked: 300' Assistive device utilized: None Level of assistance: SBA Walked 2 min 50 sec, then fatigued, occasional weaving using walls, door frames to correct                                                                                                                                TREATMENT DATE:  09/14/24 NuStep L 5 x 6 min 30 second sit to stand =  12 reps did come to full standing on 3 reps  HS curls 25lb 2x10 Leg Ext 5lb 2x10 Shoulder Ext 5lb 2x10 Heel raises from floor 2x10 6in step ups 1 rail x10 each Seated Rows green 210  09/07/24  NuStep L 5 x  min Gait 8 laps in clinic ~ 828ft  Sit to stands 2x10 6in step ups x10 2 rails Alt 4in box taps from airex in // bars  09/03/24:  BP seated:134/67 Resisted gait backwards, sideways, 5 reps each, needed CGA/min A 20# Nustep L 5 x 6 min Ue's and LE's  In ll bars for static standing with feet together, eyes closed, and with feet together eyes open with hed nods and head turns Lateral side stepping over sea urchin, pt tends to step behind the target instead of flexing hip to clear it  Moved to 6 step and performed alt forward toe taps  Lateral toe taps 15 x each leg   08/31/23 Bike L 2.8 x HS curls 2x12 20lbs Leg Ext 5lb 2x12 PSFS 5.75 6in step ups 1 rail x10 each, x 5 each Hip add ball squeeze   08/27/24 Bike L 2.8 x THERAPEUTIC ACTIVITY: Utilized to increase balance  and coordination in LE towards improving functional goals.  Static standing feet shoulder width apart holding rail head turned in each direction until fatigue. Alternating eyes open and closed Walking step over ball urchin using parallel bars with mod assist gait belt.   THERAPEUTIC EXERCISE:  Utilized to increase muscle strength and endurance in LE Leg ext 2x10 5lbs HS curls 2x10 25lbs Seated isometric adduction with the blue ball   08/22/24:  Resting vital signs: pulse oxygen 99%, BP 157/72 Bike L 2.5 x 6  min Resisted gait: 10# back, laterally, 5 reps  each, required CGA, Min A at times , very fatigued after therex and stumbles while walking with st cane from pulley stack to the next station.   Standing in ll bars for alt toe taps to sea urchins with R hand support mass practice    08/17/24 Bike L2.5 x6 min   Sit to st and Hands on knees 2x10 6in step ups 2 rails x10 each HS curls 25lb x10, x6  15lb x4 Leg Ext 5lb 2x10    08/15/24:  B knee ext 5#, 2 x 10 B knee flex 20#, 2 x 10 Nustep L 4 x 7 min  Standing in ll bars for heel toe rocks standing on airex Standing on airex feet shoulder width, horizontal head turns Hip adduction ball squeezes  5 sec holds, 10x Sit to stand from hi low table with table elevated 5 x 2 , hands on thighs    07/31/24 NuStep L 3 x 7 min Sit to stands 2x10 HS curls 20lb 2x10 Leg Ext 5lb 2x10 Hip abd blue 2x10 Hip add ball squeeze  07/11/24:   Evaluation, education regarding Berg score    PATIENT EDUCATION:  Education details: POC, goals Person educated: Patient Education method: Medical Illustrator Education comprehension: verbalized understanding  HOME EXERCISE PROGRAM: TBD  ASSESSMENT:  CLINICAL IMPRESSION:  Pt is a 89 y.o attended PT session due to LE weakness, balance, and cardiovascular endurance. Pt requires frequent rest between therapeutic exercise. She has progressed to 0 sec sit to stands. A Pt could continue to benefit from effective strengthening, balance activities, and endurance training with Physical Therapy.   Patient is a 89 y.o. female who participated with physical therapy today due to more instability and LE weakness. Pt fatigues quick requiring frequent rest periods. She stumbled when more fatigued today.  She is to follow up with her MD regarding her liver this Friday.  She is using her cane consistently.  Should benefit from supervised strengthening and balance activities in a safe setting with phyiscal  therapy.  OBJECTIVE IMPAIRMENTS: cardiopulmonary status limiting activity, decreased activity tolerance, decreased balance, decreased endurance, difficulty walking, decreased strength, and improper body mechanics.   ACTIVITY LIMITATIONS: carrying, lifting, bending, standing, squatting, stairs, transfers, and locomotion level  PARTICIPATION LIMITATIONS: meal prep, cleaning, laundry, shopping, community activity, and yard work  PERSONAL FACTORS: Age, Behavior pattern, Fitness, Time since onset of injury/illness/exacerbation, and 1-2 comorbidities: non alcoholic cirrhosis, DM are also affecting patient's functional outcome.   REHAB POTENTIAL: Good  CLINICAL DECISION MAKING: Evolving/moderate complexity  EVALUATION COMPLEXITY: High   GOALS: Goals reviewed with patient? Yes  SHORT TERM GOALS: Target date: 2 weeks 07/25/24 I HEP Baseline: Goal status: INITIAL   LONG TERM GOALS: Target date: 10/03/24 12 weeks   Improve Berg from 24/56 to 4 /56 or greater Baseline:  Goal status: INITIAL  2.  Able to move 30 second sit to stand without arms from standard chair 11 reps or greater Baseline: reliant on arms Goal status: Progressing 08/17/24, Partly met 09/14/24  3.  Improve PSFS from 4.5 to 7.5 or greater Baseline:  Goal status: Met 08/30/24  4.  Able to walk 800' without fatigue Baseline: 300' Goal status: Partly Met completed the distance with fatigue /    PLAN:  PT FREQUENCY: 2x/week  PT DURATION: 12 weeks  PLANNED INTERVENTIONS: 97110-Therapeutic exercises, 97530- Therapeutic activity, [id number]- Neuromuscular re-education, 97535- Self Care, [id number]- Manual therapy, and Patient/Family education  PLAN FOR NEXT SESSION: cardiovascular training, bulk strengthening for LE's  Tanda KANDICE Sorrow, PTA, 09/14/2024, 10:18 AM  / "

## 2024-09-28 ENCOUNTER — Ambulatory Visit: Admitting: Physical Therapy

## 2024-11-07 ENCOUNTER — Ambulatory Visit: Admitting: Podiatry
